# Patient Record
Sex: Female | Born: 1965 | Race: Black or African American | Hispanic: No | Marital: Single | Smoking: Never smoker
Health system: Southern US, Community
[De-identification: ages and names within clinical notes are randomized; demographics above are authoritative.]

## PROBLEM LIST (undated history)

## (undated) DIAGNOSIS — E039 Hypothyroidism, unspecified: Secondary | ICD-10-CM

## (undated) DIAGNOSIS — J302 Other seasonal allergic rhinitis: Secondary | ICD-10-CM

## (undated) DIAGNOSIS — Z87442 Personal history of urinary calculi: Secondary | ICD-10-CM

## (undated) DIAGNOSIS — J45909 Unspecified asthma, uncomplicated: Secondary | ICD-10-CM

## (undated) DIAGNOSIS — G43909 Migraine, unspecified, not intractable, without status migrainosus: Secondary | ICD-10-CM

## (undated) HISTORY — DX: Hypothyroidism, unspecified: E03.9

## (undated) HISTORY — PX: ABDOMINAL HYSTERECTOMY: SHX81

## (undated) HISTORY — PX: BREAST BIOPSY: SHX20

## (undated) HISTORY — PX: KNEE ARTHROSCOPY: SUR90

## (undated) HISTORY — PX: BREAST CYST EXCISION: SHX579

## (undated) HISTORY — DX: Other seasonal allergic rhinitis: J30.2

## (undated) HISTORY — DX: Migraine, unspecified, not intractable, without status migrainosus: G43.909

---
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
## 1997-01-31 HISTORY — PX: CHOLECYSTECTOMY: SHX55

## 2000-07-15 ENCOUNTER — Ambulatory Visit (HOSPITAL_COMMUNITY): Admission: RE | Admit: 2000-07-15 | Discharge: 2000-07-15 | Payer: Self-pay | Admitting: Internal Medicine

## 2000-07-15 ENCOUNTER — Encounter (INDEPENDENT_AMBULATORY_CARE_PROVIDER_SITE_OTHER): Payer: Self-pay | Admitting: Internal Medicine

## 2000-09-28 ENCOUNTER — Emergency Department (HOSPITAL_COMMUNITY): Admission: EM | Admit: 2000-09-28 | Discharge: 2000-09-28 | Payer: Self-pay | Admitting: Emergency Medicine

## 2000-10-26 ENCOUNTER — Emergency Department (HOSPITAL_COMMUNITY): Admission: EM | Admit: 2000-10-26 | Discharge: 2000-10-26 | Payer: Self-pay | Admitting: *Deleted

## 2000-12-09 ENCOUNTER — Emergency Department (HOSPITAL_COMMUNITY): Admission: EM | Admit: 2000-12-09 | Discharge: 2000-12-09 | Payer: Self-pay | Admitting: Internal Medicine

## 2000-12-09 ENCOUNTER — Encounter: Payer: Self-pay | Admitting: Internal Medicine

## 2001-01-31 HISTORY — PX: OVARIAN CYST SURGERY: SHX726

## 2001-02-12 ENCOUNTER — Inpatient Hospital Stay (HOSPITAL_COMMUNITY): Admission: AD | Admit: 2001-02-12 | Discharge: 2001-02-15 | Payer: Self-pay | Admitting: Obstetrics and Gynecology

## 2001-03-10 ENCOUNTER — Emergency Department (HOSPITAL_COMMUNITY): Admission: EM | Admit: 2001-03-10 | Discharge: 2001-03-10 | Payer: Self-pay | Admitting: Emergency Medicine

## 2001-05-23 ENCOUNTER — Emergency Department (HOSPITAL_COMMUNITY): Admission: EM | Admit: 2001-05-23 | Discharge: 2001-05-23 | Payer: Self-pay | Admitting: *Deleted

## 2001-06-19 ENCOUNTER — Ambulatory Visit (HOSPITAL_COMMUNITY): Admission: RE | Admit: 2001-06-19 | Discharge: 2001-06-19 | Payer: Self-pay | Admitting: Internal Medicine

## 2001-06-20 ENCOUNTER — Encounter: Payer: Self-pay | Admitting: Internal Medicine

## 2001-09-25 ENCOUNTER — Emergency Department (HOSPITAL_COMMUNITY): Admission: EM | Admit: 2001-09-25 | Discharge: 2001-09-25 | Payer: Self-pay | Admitting: *Deleted

## 2002-10-25 ENCOUNTER — Ambulatory Visit (HOSPITAL_COMMUNITY): Admission: RE | Admit: 2002-10-25 | Discharge: 2002-10-25 | Payer: Self-pay | Admitting: Internal Medicine

## 2002-10-25 ENCOUNTER — Encounter: Payer: Self-pay | Admitting: Internal Medicine

## 2002-11-05 ENCOUNTER — Ambulatory Visit (HOSPITAL_COMMUNITY): Admission: RE | Admit: 2002-11-05 | Discharge: 2002-11-05 | Payer: Self-pay

## 2002-12-20 ENCOUNTER — Emergency Department (HOSPITAL_COMMUNITY): Admission: EM | Admit: 2002-12-20 | Discharge: 2002-12-20 | Payer: Self-pay | Admitting: Emergency Medicine

## 2003-06-18 ENCOUNTER — Emergency Department (HOSPITAL_COMMUNITY): Admission: EM | Admit: 2003-06-18 | Discharge: 2003-06-18 | Payer: Self-pay | Admitting: Emergency Medicine

## 2003-09-11 ENCOUNTER — Ambulatory Visit (HOSPITAL_COMMUNITY): Admission: RE | Admit: 2003-09-11 | Discharge: 2003-09-11 | Payer: Self-pay | Admitting: Pulmonary Disease

## 2003-09-22 ENCOUNTER — Ambulatory Visit (HOSPITAL_COMMUNITY): Admission: RE | Admit: 2003-09-22 | Discharge: 2003-09-22 | Payer: Self-pay | Admitting: Obstetrics and Gynecology

## 2003-11-29 ENCOUNTER — Emergency Department (HOSPITAL_COMMUNITY): Admission: EM | Admit: 2003-11-29 | Discharge: 2003-11-29 | Payer: Self-pay | Admitting: Emergency Medicine

## 2004-02-01 HISTORY — PX: THYROIDECTOMY: SHX17

## 2004-02-19 ENCOUNTER — Emergency Department (HOSPITAL_COMMUNITY): Admission: EM | Admit: 2004-02-19 | Discharge: 2004-02-19 | Payer: Self-pay | Admitting: Emergency Medicine

## 2004-04-13 ENCOUNTER — Ambulatory Visit: Payer: Self-pay | Admitting: Family Medicine

## 2004-05-26 ENCOUNTER — Ambulatory Visit: Payer: Self-pay | Admitting: Family Medicine

## 2004-08-05 ENCOUNTER — Emergency Department (HOSPITAL_COMMUNITY): Admission: EM | Admit: 2004-08-05 | Discharge: 2004-08-05 | Payer: Self-pay | Admitting: Emergency Medicine

## 2004-08-06 ENCOUNTER — Ambulatory Visit: Payer: Self-pay | Admitting: Family Medicine

## 2004-09-03 ENCOUNTER — Ambulatory Visit: Payer: Self-pay | Admitting: Family Medicine

## 2004-09-23 ENCOUNTER — Ambulatory Visit (HOSPITAL_COMMUNITY): Admission: RE | Admit: 2004-09-23 | Discharge: 2004-09-23 | Payer: Self-pay | Admitting: Family Medicine

## 2004-10-08 ENCOUNTER — Encounter: Admission: RE | Admit: 2004-10-08 | Discharge: 2004-10-08 | Payer: Self-pay | Admitting: Family Medicine

## 2004-10-08 ENCOUNTER — Ambulatory Visit: Payer: Self-pay | Admitting: Family Medicine

## 2004-11-12 ENCOUNTER — Emergency Department (HOSPITAL_COMMUNITY): Admission: EM | Admit: 2004-11-12 | Discharge: 2004-11-12 | Payer: Self-pay | Admitting: Emergency Medicine

## 2004-11-15 ENCOUNTER — Ambulatory Visit: Payer: Self-pay | Admitting: Family Medicine

## 2005-01-31 HISTORY — PX: CYSTECTOMY: SUR359

## 2005-02-08 ENCOUNTER — Ambulatory Visit (HOSPITAL_BASED_OUTPATIENT_CLINIC_OR_DEPARTMENT_OTHER): Admission: RE | Admit: 2005-02-08 | Discharge: 2005-02-08 | Payer: Self-pay | Admitting: General Surgery

## 2005-02-08 ENCOUNTER — Encounter: Admission: RE | Admit: 2005-02-08 | Discharge: 2005-02-08 | Payer: Self-pay | Admitting: General Surgery

## 2005-02-08 ENCOUNTER — Encounter (INDEPENDENT_AMBULATORY_CARE_PROVIDER_SITE_OTHER): Payer: Self-pay | Admitting: *Deleted

## 2005-05-01 ENCOUNTER — Emergency Department (HOSPITAL_COMMUNITY): Admission: EM | Admit: 2005-05-01 | Discharge: 2005-05-01 | Payer: Self-pay | Admitting: Emergency Medicine

## 2005-07-06 ENCOUNTER — Ambulatory Visit: Payer: Self-pay | Admitting: Family Medicine

## 2005-10-04 ENCOUNTER — Encounter: Admission: RE | Admit: 2005-10-04 | Discharge: 2005-10-04 | Payer: Self-pay | Admitting: Family Medicine

## 2005-10-04 ENCOUNTER — Ambulatory Visit: Payer: Self-pay | Admitting: Family Medicine

## 2005-10-12 ENCOUNTER — Encounter: Admission: RE | Admit: 2005-10-12 | Discharge: 2005-10-12 | Payer: Self-pay | Admitting: Family Medicine

## 2005-12-21 ENCOUNTER — Ambulatory Visit: Payer: Self-pay | Admitting: Family Medicine

## 2006-01-06 ENCOUNTER — Ambulatory Visit: Payer: Self-pay | Admitting: Family Medicine

## 2006-03-09 ENCOUNTER — Ambulatory Visit (HOSPITAL_COMMUNITY): Admission: RE | Admit: 2006-03-09 | Discharge: 2006-03-09 | Payer: Self-pay | Admitting: Family Medicine

## 2006-03-09 ENCOUNTER — Ambulatory Visit: Payer: Self-pay | Admitting: Family Medicine

## 2006-04-05 ENCOUNTER — Ambulatory Visit: Payer: Self-pay | Admitting: Family Medicine

## 2006-05-29 ENCOUNTER — Encounter: Payer: Self-pay | Admitting: Family Medicine

## 2006-05-29 LAB — CONVERTED CEMR LAB
BUN: 11 mg/dL (ref 6–23)
Basophils Absolute: 0.1 10*3/uL (ref 0.0–0.1)
CO2: 23 meq/L (ref 19–32)
Calcium: 8.7 mg/dL (ref 8.4–10.5)
Creatinine, Ser: 0.71 mg/dL (ref 0.40–1.50)
Eosinophils Relative: 4 % (ref 0–5)
Hemoglobin: 12.9 g/dL — ABNORMAL LOW (ref 13.0–17.0)
Lymphocytes Relative: 40 % (ref 12–46)
Lymphs Abs: 1.8 10*3/uL (ref 0.7–3.3)
MCHC: 32.3 g/dL (ref 30.0–36.0)
MCV: 89.3 fL (ref 78.0–100.0)
Monocytes Absolute: 0.3 10*3/uL (ref 0.2–0.7)
Potassium: 3.9 meq/L (ref 3.5–5.3)
Sodium: 143 meq/L (ref 135–145)
TSH: 6.617 microintl units/mL — ABNORMAL HIGH (ref 0.350–5.50)
Triglycerides: 82 mg/dL (ref <150)
WBC: 4.5 10*3/uL (ref 4.0–10.5)

## 2006-06-01 ENCOUNTER — Ambulatory Visit: Payer: Self-pay | Admitting: Family Medicine

## 2006-07-11 ENCOUNTER — Ambulatory Visit: Payer: Self-pay | Admitting: Internal Medicine

## 2006-07-11 ENCOUNTER — Ambulatory Visit (HOSPITAL_COMMUNITY): Admission: RE | Admit: 2006-07-11 | Discharge: 2006-07-11 | Payer: Self-pay | Admitting: Internal Medicine

## 2006-07-20 ENCOUNTER — Encounter: Payer: Self-pay | Admitting: Family Medicine

## 2006-07-27 ENCOUNTER — Ambulatory Visit: Payer: Self-pay | Admitting: Family Medicine

## 2006-09-11 ENCOUNTER — Encounter: Payer: Self-pay | Admitting: Family Medicine

## 2006-09-11 LAB — CONVERTED CEMR LAB: TSH: 2.309 microintl units/mL (ref 0.350–5.50)

## 2006-09-13 ENCOUNTER — Emergency Department (HOSPITAL_COMMUNITY): Admission: EM | Admit: 2006-09-13 | Discharge: 2006-09-13 | Payer: Self-pay | Admitting: Emergency Medicine

## 2006-09-22 ENCOUNTER — Ambulatory Visit: Payer: Self-pay | Admitting: Family Medicine

## 2006-10-13 ENCOUNTER — Encounter: Admission: RE | Admit: 2006-10-13 | Discharge: 2006-10-13 | Payer: Self-pay | Admitting: Family Medicine

## 2007-01-18 ENCOUNTER — Encounter: Payer: Self-pay | Admitting: Family Medicine

## 2007-01-18 LAB — CONVERTED CEMR LAB: T3, Free: 2.9 pg/mL (ref 2.3–4.2)

## 2007-01-29 ENCOUNTER — Ambulatory Visit: Payer: Self-pay | Admitting: Family Medicine

## 2007-05-28 ENCOUNTER — Encounter: Payer: Self-pay | Admitting: Family Medicine

## 2007-05-28 LAB — CONVERTED CEMR LAB
BUN: 8 mg/dL (ref 6–23)
Basophils Absolute: 0.1 10*3/uL (ref 0.0–0.1)
Basophils Relative: 2 % — ABNORMAL HIGH (ref 0–1)
Calcium: 8.8 mg/dL (ref 8.4–10.5)
Chloride: 110 meq/L (ref 96–112)
Creatinine, Ser: 0.7 mg/dL (ref 0.40–1.50)
Eosinophils Absolute: 0.2 10*3/uL (ref 0.0–0.7)
HCT: 38.9 % — ABNORMAL LOW (ref 39.0–52.0)
HDL: 56 mg/dL (ref >39)
MCV: 88.6 fL (ref 78.0–100.0)
Monocytes Absolute: 0.3 10*3/uL (ref 0.1–1.0)
Neutro Abs: 2.2 10*3/uL (ref 1.7–7.7)
RDW: 13.4 % (ref 11.5–15.5)
Sodium: 141 meq/L (ref 135–145)
Total CHOL/HDL Ratio: 2.8
WBC: 4.8 10*3/uL (ref 4.0–10.5)

## 2007-05-31 ENCOUNTER — Ambulatory Visit: Payer: Self-pay | Admitting: Family Medicine

## 2007-06-06 DIAGNOSIS — J301 Allergic rhinitis due to pollen: Secondary | ICD-10-CM | POA: Insufficient documentation

## 2007-06-06 DIAGNOSIS — G43109 Migraine with aura, not intractable, without status migrainosus: Secondary | ICD-10-CM | POA: Insufficient documentation

## 2007-06-06 DIAGNOSIS — E039 Hypothyroidism, unspecified: Secondary | ICD-10-CM | POA: Insufficient documentation

## 2007-09-03 ENCOUNTER — Telehealth: Payer: Self-pay | Admitting: Family Medicine

## 2007-09-04 ENCOUNTER — Ambulatory Visit: Payer: Self-pay | Admitting: Family Medicine

## 2007-09-04 ENCOUNTER — Encounter: Payer: Self-pay | Admitting: Family Medicine

## 2007-09-04 DIAGNOSIS — R259 Unspecified abnormal involuntary movements: Secondary | ICD-10-CM | POA: Insufficient documentation

## 2007-09-19 ENCOUNTER — Telehealth: Payer: Self-pay | Admitting: Family Medicine

## 2007-09-19 ENCOUNTER — Ambulatory Visit: Payer: Self-pay | Admitting: Family Medicine

## 2007-10-17 ENCOUNTER — Encounter: Admission: RE | Admit: 2007-10-17 | Discharge: 2007-10-17 | Payer: Self-pay | Admitting: Family Medicine

## 2007-11-05 ENCOUNTER — Encounter: Payer: Self-pay | Admitting: Family Medicine

## 2007-11-05 ENCOUNTER — Other Ambulatory Visit: Admission: RE | Admit: 2007-11-05 | Discharge: 2007-11-05 | Payer: Self-pay | Admitting: Obstetrics and Gynecology

## 2007-11-05 LAB — CONVERTED CEMR LAB: TSH: 3.343 microintl units/mL (ref 0.350–4.50)

## 2007-11-13 ENCOUNTER — Ambulatory Visit: Payer: Self-pay | Admitting: Family Medicine

## 2008-01-09 ENCOUNTER — Ambulatory Visit: Payer: Self-pay | Admitting: Family Medicine

## 2008-01-09 DIAGNOSIS — J0100 Acute maxillary sinusitis, unspecified: Secondary | ICD-10-CM | POA: Insufficient documentation

## 2008-01-09 DIAGNOSIS — J209 Acute bronchitis, unspecified: Secondary | ICD-10-CM | POA: Insufficient documentation

## 2008-01-09 DIAGNOSIS — J029 Acute pharyngitis, unspecified: Secondary | ICD-10-CM | POA: Insufficient documentation

## 2008-01-09 LAB — CONVERTED CEMR LAB: Rapid Strep: NEGATIVE

## 2008-01-13 DIAGNOSIS — R519 Headache, unspecified: Secondary | ICD-10-CM | POA: Insufficient documentation

## 2008-01-13 DIAGNOSIS — R51 Headache: Secondary | ICD-10-CM | POA: Insufficient documentation

## 2008-02-07 ENCOUNTER — Encounter: Payer: Self-pay | Admitting: Family Medicine

## 2008-02-11 ENCOUNTER — Telehealth: Payer: Self-pay | Admitting: Family Medicine

## 2008-02-11 ENCOUNTER — Encounter: Payer: Self-pay | Admitting: Family Medicine

## 2008-02-11 LAB — CONVERTED CEMR LAB: TSH: 3.625 microintl units/mL (ref 0.350–4.50)

## 2008-02-13 ENCOUNTER — Encounter: Payer: Self-pay | Admitting: Family Medicine

## 2008-02-23 ENCOUNTER — Encounter: Payer: Self-pay | Admitting: Family Medicine

## 2008-05-08 ENCOUNTER — Encounter: Payer: Self-pay | Admitting: Family Medicine

## 2008-05-22 ENCOUNTER — Encounter: Payer: Self-pay | Admitting: Family Medicine

## 2008-05-22 LAB — CONVERTED CEMR LAB
Basophils Absolute: 0.1 10*3/uL (ref 0.0–0.1)
Creatinine, Ser: 0.78 mg/dL (ref 0.40–1.20)
Eosinophils Absolute: 0.2 10*3/uL (ref 0.0–0.7)
Hemoglobin: 13.4 g/dL (ref 12.0–15.0)
LDL Cholesterol: 95 mg/dL (ref 0–99)
Lymphocytes Relative: 39 % (ref 12–46)
Monocytes Relative: 9 % (ref 3–12)
Potassium: 4.2 meq/L (ref 3.5–5.3)
RBC: 4.63 M/uL (ref 3.87–5.11)
RDW: 13.4 % (ref 11.5–15.5)
Sodium: 140 meq/L (ref 135–145)
Total CHOL/HDL Ratio: 2.9
VLDL: 16 mg/dL (ref 0–40)

## 2008-05-29 ENCOUNTER — Ambulatory Visit: Payer: Self-pay | Admitting: Family Medicine

## 2008-05-29 DIAGNOSIS — R5383 Other fatigue: Secondary | ICD-10-CM

## 2008-05-29 DIAGNOSIS — R5381 Other malaise: Secondary | ICD-10-CM | POA: Insufficient documentation

## 2008-06-05 ENCOUNTER — Telehealth: Payer: Self-pay | Admitting: Family Medicine

## 2008-06-05 ENCOUNTER — Encounter: Payer: Self-pay | Admitting: Family Medicine

## 2008-06-23 ENCOUNTER — Telehealth: Payer: Self-pay | Admitting: Family Medicine

## 2008-07-13 ENCOUNTER — Emergency Department (HOSPITAL_COMMUNITY): Admission: EM | Admit: 2008-07-13 | Discharge: 2008-07-13 | Payer: Self-pay | Admitting: Emergency Medicine

## 2008-10-09 ENCOUNTER — Encounter: Payer: Self-pay | Admitting: Family Medicine

## 2008-10-09 ENCOUNTER — Telehealth: Payer: Self-pay | Admitting: Family Medicine

## 2008-10-17 ENCOUNTER — Encounter: Admission: RE | Admit: 2008-10-17 | Discharge: 2008-10-17 | Payer: Self-pay | Admitting: Family Medicine

## 2008-10-28 ENCOUNTER — Ambulatory Visit: Payer: Self-pay | Admitting: Family Medicine

## 2008-10-28 DIAGNOSIS — M79609 Pain in unspecified limb: Secondary | ICD-10-CM | POA: Insufficient documentation

## 2008-10-28 DIAGNOSIS — J320 Chronic maxillary sinusitis: Secondary | ICD-10-CM | POA: Insufficient documentation

## 2008-10-30 ENCOUNTER — Encounter: Payer: Self-pay | Admitting: Family Medicine

## 2008-11-12 ENCOUNTER — Encounter: Payer: Self-pay | Admitting: Family Medicine

## 2008-11-23 ENCOUNTER — Telehealth (INDEPENDENT_AMBULATORY_CARE_PROVIDER_SITE_OTHER): Payer: Self-pay | Admitting: *Deleted

## 2008-12-01 ENCOUNTER — Encounter: Payer: Self-pay | Admitting: Family Medicine

## 2008-12-10 ENCOUNTER — Ambulatory Visit (HOSPITAL_COMMUNITY): Admission: RE | Admit: 2008-12-10 | Discharge: 2008-12-10 | Payer: Self-pay | Admitting: Family Medicine

## 2008-12-11 ENCOUNTER — Encounter: Payer: Self-pay | Admitting: Family Medicine

## 2009-02-26 ENCOUNTER — Ambulatory Visit: Payer: Self-pay | Admitting: Family Medicine

## 2009-02-26 DIAGNOSIS — R6889 Other general symptoms and signs: Secondary | ICD-10-CM | POA: Insufficient documentation

## 2009-03-03 ENCOUNTER — Encounter: Payer: Self-pay | Admitting: Family Medicine

## 2009-03-03 LAB — CONVERTED CEMR LAB: TSH: 4.466 microintl units/mL (ref 0.350–4.500)

## 2009-03-25 ENCOUNTER — Encounter: Payer: Self-pay | Admitting: Family Medicine

## 2009-05-31 ENCOUNTER — Emergency Department (HOSPITAL_COMMUNITY): Admission: EM | Admit: 2009-05-31 | Discharge: 2009-05-31 | Payer: Self-pay

## 2009-09-21 LAB — CONVERTED CEMR LAB
BUN: 10 mg/dL (ref 6–23)
Basophils Relative: 3 % — ABNORMAL HIGH (ref 0–1)
Chloride: 109 meq/L (ref 96–112)
Cholesterol: 163 mg/dL (ref 0–200)
Creatinine, Ser: 0.73 mg/dL (ref 0.40–1.20)
Eosinophils Relative: 4 % (ref 0–5)
HCT: 39.5 % (ref 36.0–46.0)
Hemoglobin: 12.9 g/dL (ref 12.0–15.0)
Lymphocytes Relative: 39 % (ref 12–46)
MCHC: 32.7 g/dL (ref 30.0–36.0)
MCV: 89.6 fL (ref 78.0–100.0)
Monocytes Relative: 8 % (ref 3–12)
Neutrophils Relative %: 46 % (ref 43–77)
Platelets: 289 10*3/uL (ref 150–400)
Potassium: 4 meq/L (ref 3.5–5.3)
RDW: 13.5 % (ref 11.5–15.5)
WBC: 5.1 10*3/uL (ref 4.0–10.5)

## 2009-09-24 ENCOUNTER — Ambulatory Visit: Payer: Self-pay | Admitting: Family Medicine

## 2009-09-24 LAB — CONVERTED CEMR LAB: TSH: 3.677 microintl units/mL (ref 0.350–4.500)

## 2009-09-25 ENCOUNTER — Encounter: Payer: Self-pay | Admitting: Family Medicine

## 2009-11-30 ENCOUNTER — Telehealth: Payer: Self-pay | Admitting: Family Medicine

## 2010-02-15 ENCOUNTER — Telehealth: Payer: Self-pay | Admitting: Family Medicine

## 2010-02-15 ENCOUNTER — Ambulatory Visit
Admission: RE | Admit: 2010-02-15 | Discharge: 2010-02-15 | Payer: Self-pay | Source: Home / Self Care | Attending: Family Medicine | Admitting: Family Medicine

## 2010-02-16 ENCOUNTER — Telehealth: Payer: Self-pay | Admitting: Family Medicine

## 2010-02-21 ENCOUNTER — Encounter: Payer: Self-pay | Admitting: Family Medicine

## 2010-02-26 ENCOUNTER — Encounter
Admission: RE | Admit: 2010-02-26 | Discharge: 2010-02-26 | Payer: Self-pay | Source: Home / Self Care | Attending: Family Medicine | Admitting: Family Medicine

## 2010-03-04 NOTE — Assessment & Plan Note (Signed)
Summary: office visit   Vital Signs:  Patient profile:   45 year old female Menstrual status:  hysterectomy Height:      65 inches Weight:      184 pounds BMI:     30.73 O2 Sat:      98 % Pulse rate:   70 / minute Pulse rhythm:   regular Resp:     16 per minute BP sitting:   118 / 74 Cuff size:   regular  Vitals Entered By: Gail Cox (February 26, 2009 1:08 PM)  Nutrition Counseling: Patient's BMI is greater than 25 and therefore counseled on weight management options. CC: has been having  sour taste in mouth, was given antibiotics and was told to come back if it didn't go away, She also had her sinuses checked and it wasn't due to that   CC:  has been having  sour taste in mouth, was given antibiotics and was told to come back if it didn't go away, and She also had her sinuses checked and it wasn't due to that.  History of Present Illness: Over 2 year h/o bad breath and a bad taste in her mouth, continues to suck on candy to keep this down, her dental care is excellent. She has had sinus eval and a therapeutic trial of antibiotics no help. She reports improvement oin her migraines, in terms of frequency and severity. Reports  that she has otherwise been doing well. Denies recent fever or chills. Denies sinus pressure, nasal congestion , ear pain or sore throat. Denies chest congestion, or cough productive of sputum. Denies chest pain, palpitations, PND, orthopnea or leg swelling. Denies abdominal pain, nausea, vomitting, diarrhea or constipation. Denies change in bowel movements or bloody stool. Denies dysuria , frequency, incontinence or hesitancy. Denies  joint pain, swelling, or reduced mobility. Denies headaches, vertigo, seizures. Denies depression, anxiety or insomnia. Denies  rash, lesions, or itch. [name] has not been exercising, she has gained weight , and she intends to change her diet to facilitate weight loss.     Current Medications (verified): 1)   Imitrex 100 Mg  Tabs (Sumatriptan Succinate) .... Take One Tablet At Onset of Headache Repeat After Two Hours As Needed 2)  Astepro 137 Mcg/spray  Soln (Azelastine Hcl) .... Inhale Two Puffs Twice Daily 3)  Proair Hfa 108 (90 Base) Mcg/act  Aers (Albuterol Sulfate) .... 2 Puffs Every 6 To 8 Hrs. As Needed 4)  Synthroid 125 Mcg  Tabs (Levothyroxine Sodium) .... Take 1 Tablet By Mouth Once A Day  Allergies (verified): 1)  ! Vicodin  Review of Systems      See HPI Eyes:  Denies blurring and discharge. MS:  left heel spur. Endo:  Denies cold intolerance, excessive hunger, excessive thirst, excessive urination, heat intolerance, polyuria, and weight change. Heme:  Denies abnormal bruising and bleeding. Allergy:  Complains of seasonal allergies; denies hives or rash and sneezing.  Physical Exam  General:  Well-developed,well-nourished,in no acute distress; alert,appropriate and cooperative throughout examination HEENT: No facial asymmetry,  EOMI, No sinus tenderness, TM's Clear, oropharynx  pink and moist.   Chest: Clear to auscultation bilaterally.  CVS: S1, S2, No murmurs, No S3.   Abd: Soft, Nontender.  MS: Adequate ROM spine, hips, shoulders and knees.  Ext: No edema.   CNS: CN 2-12 intact, power tone and sensation normal throughout.   Skin: Intact, no visible lesions or rashes.  Psych: Good eye contact, normal affect.  Memory intact, not anxious or  depressed appearing.    Impression & Recommendations:  Problem # 1:  FOOT PAIN, LEFT (ICD-729.5) Assessment Unchanged pt has a spur  Problem # 2:  HYPOTHYROIDISM (ICD-244.9) Assessment: Comment Only  Her updated medication list for this problem includes:    Synthroid 125 Mcg Tabs (Levothyroxine sodium) .[name]... Take 1 tablet by mouth once a day  Labs Reviewed: TSH: 1.661 (10/28/2008)    Chol: 168 (05/22/2008)   HDL: 57 (05/22/2008)   LDL: 95 (05/22/2008)   TG: 79 (05/22/2008)  Problem # 3:  ALLERGIC RHINITIS, SEASONAL  (ICD-477.0) Assessment: Unchanged  Problem # 4:  OTHER SYMPTOMS INVOLVING HEAD AND NECK (ICD-784.99) Assessment: Comment Only  Orders: ENT Referral (ENT), chronic c/o halitosis  Complete Medication List: 1)  Imitrex 100 Mg Tabs (Sumatriptan succinate) .... Take one tablet at onset of headache repeat after two hours as needed 2)  Astepro 137 Mcg/spray Soln (Azelastine hcl) .... Inhale two puffs twice daily 3)  Proair Hfa 108 (90 Base) Mcg/act Aers (Albuterol sulfate) .... 2 puffs every 6 to 8 hrs. as needed 4)  Synthroid 125 Mcg Tabs (Levothyroxine sodium) .... Take 1 tablet by mouth once a day  Other Orders: T-Basic Metabolic Panel [phone]) T-Lipid Profile [phone]) T-CBC w/Diff [id number]) T-TSH [id number])  Patient Instructions: 1)  Please schedule a follow-up appointment in 4.5 months. 2)  It is important that you exercise regularly at least 20 minutes 5 times a week. If you develop chest pain, have severe difficulty breathing, or feel very tired , stop exercising immediately and seek medical attention. 3)  You need to lose weight. Consider a lower calorie diet and regular exercise.  4)  you will be referred  to eNT for eval of your halitosis 5)  TSH prior to visit, ICD-9:  today. 6)  BMP prior to visit, ICD-9: 7)  Lipid Panel prior to visit, ICD-9:  fasting in 4.5 months 8)  TSH prior to visit, ICD-9: 9)  CBC w/ Diff prior to visit, ICD-9:

## 2010-03-04 NOTE — Letter (Signed)
Summary: Letter  Letter   Imported By: Lind Guest 03/03/2009 15:42:56  _____________________________________________________________________  External Attachment:    Type:   Image     Comment:   External Document

## 2010-03-04 NOTE — Assessment & Plan Note (Signed)
Summary: per dr   Vital Signs:  Patient profile:   45 year old female Menstrual status:  hysterectomy Height:      65 inches Weight:      179.75 pounds BMI:     30.02 O2 Sat:      98 % Pulse rate:   87 / minute Pulse rhythm:   regular Resp:     16 per minute BP sitting:   130 / 80  (left arm) Cuff size:   regular  Vitals Entered By: Everitt Amber LPN (February 15, 2010 2:03 PM)  Nutrition Counseling: Patient's BMI is greater than 25 and therefore counseled on weight management options. CC: has had a bad cold since wednesday, nasal congestion, pressure and sneezing. Greenish phlegm, sore throat   CC:  has had a bad cold since wednesday, nasal congestion, pressure and sneezing. Greenish phlegm, and sore throat.  History of Present Illness: 6 day h/o maxillary pressure, nasal congestion, sore throat, tender swollen neck glands  Denies chest congestion, or cough productive of sputum. Denies chest pain, palpitations, PND, orthopnea or leg swelling. Denies abdominal pain, nausea, vomitting, diarrhea or constipation. Denies change in bowel movements or bloody stool. Denies dysuria , frequency, incontinence or hesitancy. Denies  joint pain, swelling, or reduced mobility. Denies  vertigo or  seizures.Her headaches have improved Denies depression, anxiety or insomnia. Denies  rash, lesions, or itch.     Current Medications (verified): 1)  Imitrex 100 Mg  Tabs (Sumatriptan Succinate) .... Take One Tablet At Onset of Headache Repeat After Two Hours As Needed 2)  Proair Hfa 108 (90 Base) Mcg/act  Aers (Albuterol Sulfate) .... 2 Puffs Every 6 To 8 Hrs. As Needed 3)  Synthroid 125 Mcg  Tabs (Levothyroxine Sodium) .... Take 1 Tablet By Mouth Once A Day  Allergies (verified): 1)  ! Vicodin  Review of Systems      See HPI General:  Complains of chills, fatigue, and malaise. ENT:  Complains of nasal congestion, sinus pressure, and sore throat. Neuro:  Complains of headaches. Allergy:   Complains of seasonal allergies and sneezing.  Physical Exam  General:  Well-developed,well-nourished,in no acute distress; alert,appropriate and cooperative throughout examinationIll appearing. HEENT: No facial asymmetry,  EOMI, maxillary  sinus tenderness, TM's Clear, oropharynx mildly eruthematous, bilateral cervicaldenitis  Chest: Clear to auscultation bilaterally.  CVS: S1, S2, No murmurs, No S3.   Abd: Soft, Nontender.  MS: Adequate ROM spine, hips, shoulders and knees.  Ext: No edema.   CNS: CN 2-12 intact, power tone and sensation normal throughout.   Skin: Intact, no visible lesions or rashes.  Psych: Good eye contact, normal affect.  Memory intact, not anxious or depressed appearing.    Impression & Recommendations:  Problem # 1:  ACUTE MAXILLARY SINUSITIS (ICD-461.0) Assessment Comment Only  Her updated medication list for this problem includes:    Penicillin V Potassium 500 Mg Tabs (Penicillin v potassium) .[name]... Take 1 tablet by mouth three times a day    Tessalon Perles 100 Mg Caps (Benzonatate) .[name]... Take 1 capsule by mouth three times a day  Orders: Depo- Medrol 80mg  (J1040) Ketorolac-Toradol 15mg  (W0981)  Problem # 2:  HEADACHE (ICD-784.0) Assessment: Improved  Her updated medication list for this problem includes:    Imitrex 100 Mg Tabs (Sumatriptan succinate) .[name]... Take one tablet at onset of headache repeat after two hours as needed  Problem # 3:  HYPOTHYROIDISM (ICD-244.9) Assessment: Comment Only  Her updated medication list for this problem includes:  Synthroid 125 Mcg Tabs (Levothyroxine sodium) .[name]... Take 1 tablet by mouth once a day  Orders: T-TSH ([id number]) T-TSH [phone])  Labs Reviewed: TSH: 3.677 (09/24/2009)    Chol: 163 (09/21/2009)   HDL: 55 (09/21/2009)   LDL: 94 (09/21/2009)   TG: 72 (09/21/2009)  Complete Medication List: 1)  Imitrex 100 Mg Tabs (Sumatriptan succinate) .... Take one tablet at onset of headache repeat  after two hours as needed 2)  Proair Hfa 108 (90 Base) Mcg/act Aers (Albuterol sulfate) .... 2 puffs every 6 to 8 hrs. as needed 3)  Synthroid 125 Mcg Tabs (Levothyroxine sodium) .... Take 1 tablet by mouth once a day 4)  Penicillin V Potassium 500 Mg Tabs (Penicillin v potassium) .... Take 1 tablet by mouth three times a day 5)  Tessalon Perles 100 Mg Caps (Benzonatate) .... Take 1 capsule by mouth three times a day  Other Orders: Admin of Therapeutic Inj  intramuscular or subcutaneous (13086) Rocephin  250mg  (V7846)  Patient Instructions: 1)  Please schedule a follow-up appointment in 4.5 months.Cancel any earlier 2)  You are being treated for sinusitis . 3)  You will get injections in the office and meds are also sent in  4)  TSH prior to visit, ICD-9:  today 5)  TSH in 4.5 months 6)  Schedule your mammogram.PAST DUE pLS sCHED 7)  You need to have a Pap Smear to prevent cervical cancer. Prescriptions: PREDNISONE (PAK) 5 MG TABS (PREDNISONE) Use as directed  #21 x 0   Entered and Authorized by:   Syliva Overman MD   Signed by:   Syliva Overman MD on 02/15/2010   Method used:   Electronically to        [facility] [address] [facility] (retail)       [address]       [place], [place]  [address]       Ph: [phone]       Fax: [phone]   RxID:   [id number] TESSALON PERLES 100 MG CAPS (BENZONATATE) Take 1 capsule by mouth three times a day  #30 x 0   Entered and Authorized by:   Syliva Overman MD   Signed by:   Syliva Overman MD on 02/15/2010   Method used:   Electronically to        [facility] [address] [facility] (retail)       [address]       [place], [place]  [address]       Ph: [phone]       Fax: [phone]   RxID:   [id number] PENICILLIN V POTASSIUM 500 MG TABS (PENICILLIN V POTASSIUM) Take 1 tablet by mouth three times a day  #42 x 0   Entered and Authorized by:   Syliva Overman MD   Signed by:   Syliva Overman MD on  02/15/2010   Method used:   Electronically to        [facility] [address] [facility] (retail)       [address]       [place], [place]  [address]       Ph: [phone]       Fax: [phone]   RxID:   [id number]    Medication Administration  Injection # 1:    Medication: Depo- Medrol 80mg     Diagnosis: ACUTE  MAXILLARY SINUSITIS (ICD-461.0)    Route: IM    Site: RUOQ gluteus    Exp Date: 07/12    Lot #: [id number]    Mfr: Pharmacia    Patient tolerated injection without complications    Given by: Adella Hare LPN (February 15, 2010 3:03 PM)  Injection # 2:    Medication: Ketorolac-Toradol 15mg     Diagnosis: ACUTE MAXILLARY SINUSITIS (ICD-461.0)    Route: IM    Site: LUOQ gluteus    Exp Date: 07/02/2011    Lot #: [id number]    Mfr: [email or web address]    Comments: toradol 60mg  given    Patient tolerated injection without complications    Given by: Adella Hare LPN (February 15, 2010 3:04 PM)  Injection # 3:    Medication: Rocephin  250mg     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 Patient Level IV [11914] 2)  T-TSH [78295-62130] 3)  T-TSH [86578-46962] 4)  Depo- Medrol 80mg  [J1040] 5)  Ketorolac-Toradol 15mg  [J1885] 6)  Admin of Therapeutic Inj  intramuscular or subcutaneous [96372] 7)  Rocephin  250mg  [J0696]     Medication Administration  Injection # 1:    Medication: Depo- Medrol 80mg     Diagnosis: ACUTE MAXILLARY SINUSITIS (ICD-461.0)    Route: IM    Site: RUOQ gluteus    Exp Date: 07/12    Lot #: Gunnar [id number]    Mfr: Pharmacia    Patient tolerated injection without complications    Given by: Adella Hare LPN (February 15, 2010 3:03 PM)  Injection # 2:    Medication: Ketorolac-Toradol 15mg      Diagnosis: ACUTE MAXILLARY SINUSITIS (ICD-461.0)    Route: IM    Site: LUOQ gluteus    Exp Date: 07/02/2011    Lot #: [id number]    Mfr: [email or web address]    Comments: toradol 60mg  given    Patient tolerated injection without complications    Given by: Adella Hare LPN (February 15, 2010 3:04 PM)  Injection # 3:    Medication: Rocephin  250mg     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 Patient Level IV [27253] 2)  T-TSH [66440-34742] 3)  T-TSH [59563-87564] 4)  Depo- Medrol 80mg  [J1040] 5)  Ketorolac-Toradol 15mg  [J1885] 6)  Admin of Therapeutic Inj  intramuscular or subcutaneous [96372] 7)  Rocephin  250mg  [P3295]

## 2010-03-04 NOTE — Assessment & Plan Note (Signed)
Summary: ov   Vital Signs:  Patient profile:   45 year old female Menstrual status:  hysterectomy Height:      65 inches Weight:      181 pounds BMI:     30.23 O2 Sat:      98 % on Room air Pulse rate:   85 / minute Pulse rhythm:   regular Resp:     16 per minute BP sitting:   120 / 76  (left arm)  Vitals Entered By: Adella Hare LPN (September 24, 2009 10:26 AM)  Nutrition Counseling: Patient's BMI is greater than 25 and therefore counseled on weight management options.  O2 Flow:  Room air CC: follow-up visit Is Patient Diabetic? No Pain Assessment Patient in pain? no        CC:  follow-up visit.  History of Present Illness: Reports  that she has been  doing well. Denies recent fever or chills. Denies sinus pressure, nasal congestion , ear pain or sore throat. Denies chest congestion, or cough productive of sputum. Denies chest pain, palpitations, PND, orthopnea or leg swelling. Denies abdominal pain, nausea, vomitting, diarrhea or constipation. Denies change in bowel movements or bloody stool. Denies dysuria , frequency, incontinence or hesitancy. Denies  joint pain, swelling, or reduced mobility. reports improvement in  headaches,denies  vertigo or  seizures. Denies depression, anxiety or insomnia. Denies  rash, lesions, or itch.     Current Medications (verified): 1)  Imitrex 100 Mg  Tabs (Sumatriptan Succinate) .... Take One Tablet At Onset of Headache Repeat After Two Hours As Needed 2)  Proair Hfa 108 (90 Base) Mcg/act  Aers (Albuterol Sulfate) .... 2 Puffs Every 6 To 8 Hrs. As Needed 3)  Synthroid 125 Mcg  Tabs (Levothyroxine Sodium) .... Take 1 Tablet By Mouth Once A Day  Allergies (verified): 1)  ! Vicodin  Review of Systems      See HPI General:  Complains of fatigue. Eyes:  Denies blurring and discharge. MS:  left heel pain x 3 yrs progressing. Derm:  Complains of lesion(s); p[ain;ess swelling on 2nd and 4th fimngers of the right hand, wants to  observe at this time. Neuro:  Complains of headaches; migraine headaches fairly stable and well controlled, most recent was 4 days ago, finally left 2 days  later, exceddrin migraine worked, but needs immitrex. Endo:  Denies cold intolerance, excessive thirst, excessive urination, and weight change. Heme:  Denies abnormal bruising and bleeding. Allergy:  Complains of seasonal allergies.  Physical Exam  General:  Well-developed,well-nourished,in no acute distress; alert,appropriate and cooperative throughout examination HEENT: No facial asymmetry,  EOMI, No sinus tenderness, TM's Clear, oropharynx  pink and moist.   Chest: Clear to auscultation bilaterally.  CVS: S1, S2, No murmurs, No S3.   Abd: Soft, Nontender.  MS: Adequate ROM spine, hips, shoulders and knees.  Ext: No edema.   CNS: CN 2-12 intact, power tone and sensation normal throughout.   Skin: Intact, no visible lesions or rashes.  Psych: Good eye contact, normal affect.  Memory intact, not anxious or depressed appearing.    Impression & Recommendations:  Problem # 1:  FOOT PAIN, LEFT (ICD-729.5) Assessment Unchanged  Problem # 2:  HEADACHE (ICD-784.0) Assessment: Improved  Her updated medication list for this problem includes:    Imitrex 100 Mg Tabs (Sumatriptan succinate) .[name]... Take one tablet at onset of headache repeat after two hours as needed  Problem # 3:  HYPOTHYROIDISM (ICD-244.9) Assessment: Comment Only  Her updated medication list for  this problem includes:    Synthroid 125 Mcg Tabs (Levothyroxine sodium) .[name]... Take 1 tablet by mouth once a day  Orders: T-TSH ([id number])  Labs Reviewed: TSH: 4.466 (02/26/2009)    Chol: 163 (09/21/2009)   HDL: 55 (09/21/2009)   LDL: 94 (09/21/2009)   TG: 72 (09/21/2009)  Complete Medication List: 1)  Imitrex 100 Mg Tabs (Sumatriptan succinate) .... Take one tablet at onset of headache repeat after two hours as needed 2)  Proair Hfa 108 (90 Base) Mcg/act Aers  (Albuterol sulfate) .... 2 puffs every 6 to 8 hrs. as needed 3)  Synthroid 125 Mcg Tabs (Levothyroxine sodium) .... Take 1 tablet by mouth once a day  Patient Instructions: 1)  Please schedule a follow-up appointment in 4.59months. 2)  TSH prior to visit, ICD-9:  in 4.5 months 3)  It is important that you exercise regularly at least 30 minutes 5 times a week. If you develop chest pain, have severe difficulty breathing, or feel very tired , stop exercising immediately and seek medical attention. 4)  You need to lose weight. Consider a lower calorie diet and regular exercise. Goal is 6 pounds 5)  meds will be refilled, we will call you at [phone] tomorrow about the thyroid result 6)  Schedule your mammogram. 7)  You need to have a Pap Smear to prevent cervical cancer. Prescriptions: SYNTHROID 125 MCG  TABS (LEVOTHYROXINE SODIUM) Take 1 tablet by mouth once a day Brand medically necessary #30 Each x 4   Entered by:   Adella Hare LPN   Authorized by:   Syliva Overman MD   Signed by:   Adella Hare LPN on 65/78/4696   Method used:   Electronically to        [facility] [address] (retail)       [address]       [place], [place]  [address]       Ph: [phone]       Fax: [phone]   RxID:   [id number] PROAIR HFA 108 (90 BASE) MCG/ACT  AERS (ALBUTEROL SULFATE) 2 puffs every 6 to 8 hrs. as needed  #9 Gram x 4   Entered by:   Adella Hare LPN   Authorized by:   Syliva Overman MD   Signed by:   Adella Hare LPN on 29/51/8841   Method used:   Electronically to        [facility]  [address] [address] (retail)       [address]       [place], [place]  [address]       Ph: [phone]       Fax: [phone]   RxID:   [id number] IMITREX 100 MG  TABS (SUMATRIPTAN SUCCINATE) take one tablet at onset of headache repeat after two hours as needed  #10 Each x 4   Entered by:   Adella Hare LPN   Authorized by:   Syliva Overman MD   Signed by:   Adella Hare LPN on 16/07/3708   Method used:   Electronically to        [facility]  [address] [address] (retail)       [address]       [place], [place]  [address]       Ph: [phone]       Fax: [phone]  RxID:   [id number]

## 2010-03-04 NOTE — Progress Notes (Signed)
Summary: FLU SHOT  Phone Note Call from Patient   Summary of Call: HER WORK PLACE GAVE OUT FLU SHOTS WANTED YOU TO KNOW Initial call taken by: Lind Guest,  November 30, 2009 4:19 PM  Follow-up for Phone Call        noted  Follow-up by: Syliva Overman MD,  November 30, 2009 6:09 PM

## 2010-03-04 NOTE — Progress Notes (Signed)
Summary: FYI  Phone Note Call from Patient   Summary of Call: Just wanted Dr. Lodema Hong to know she scheduled mammo for 02/26/2010.  FYI Initial call taken by: Rudene Anda,  February 16, 2010 11:06 AM  Follow-up for Phone Call        noted Follow-up by: Adella Hare LPN,  February 16, 2010 11:12 AM

## 2010-03-04 NOTE — Progress Notes (Signed)
Summary: [facility] TE[facility]  [facility]   Imported By: Lind Guest 04/07/2009 08:16:40  _____________________________________________________________________  External Attachment:    Type:   Image     Comment:   External Document

## 2010-03-04 NOTE — Letter (Signed)
Summary: LAB ON  LAB ON   Imported By: Lind Guest 09/25/2009 07:59:23  _____________________________________________________________________  External Attachment:    Type:   Image     Comment:   External Document

## 2010-03-04 NOTE — Progress Notes (Signed)
Summary: sick  Phone Note Call from Patient   Complaint: Cough/Sore throat Summary of Call: PT HAS A BUG AND JUST WANTS A SHOT. TOLD HER NOTHING OPEN.   [phone] pt said she would come at end of day and sit and wait. i told her I would send a note to the nurse cause they would have to see if she could be worked in.  Initial call taken by: Rudene Anda,  February 15, 2010 10:52 AM  Follow-up for Phone Call        sore throat, sneezing, low grade fever, runny nose with green drainage Follow-up by: Adella Hare LPN,  February 15, 2010 11:37 AM  Additional Follow-up for Phone Call Additional follow up Details #1::        advised pt to conme in for 1:45 appt today Additional Follow-up by: Syliva Overman MD,  February 15, 2010 12:19 PM

## 2010-04-01 ENCOUNTER — Telehealth (INDEPENDENT_AMBULATORY_CARE_PROVIDER_SITE_OTHER): Payer: Self-pay | Admitting: *Deleted

## 2010-04-01 DIAGNOSIS — K219 Gastro-esophageal reflux disease without esophagitis: Secondary | ICD-10-CM | POA: Insufficient documentation

## 2010-04-08 NOTE — Progress Notes (Signed)
Summary: WANTS TO SEE DR. Kendell Bane  Phone Note Call from Patient   Summary of Call: NEEDS A REFERRAL TO DR. Gildardo Griffes OFFICEFOOD WANTS TO COME BACK UP. sHE WOULD LIKE FOR THE APPOINMENT TO BE IN THE EVENING. pLEASE CALL WHEN DONE Initial call taken by: Lind Guest,  April 01, 2010 4:57 PM  Follow-up for Phone Call        ok to refer,. order entered Follow-up by: Syliva Overman MD,  April 01, 2010 5:24 PM  Additional Follow-up for Phone Call Additional follow up Details #1::        sent referral over to dr. Jeanella Flattery office. they will call pt with appt and time.  Additional Follow-up by: Rudene Anda,  April 02, 2010 11:26 AM  New Problems: GERD (ICD-530.81)   New Problems: GERD (ICD-530.81)

## 2010-05-22 ENCOUNTER — Other Ambulatory Visit: Payer: Self-pay | Admitting: Family Medicine

## 2010-05-26 ENCOUNTER — Ambulatory Visit (INDEPENDENT_AMBULATORY_CARE_PROVIDER_SITE_OTHER): Payer: BC Managed Care – PPO | Admitting: Internal Medicine

## 2010-05-26 DIAGNOSIS — K219 Gastro-esophageal reflux disease without esophagitis: Secondary | ICD-10-CM

## 2010-06-15 NOTE — Op Note (Signed)
Gail Cox, Gail Cox               ACCOUNT NO.:  [id number]   MEDICAL RECORD NO.:  [id number]          PATIENT TYPE:  AMB   LOCATION:  DAY                           FACILITY:  [facility]   PHYSICIAN:  Lionel December, M.D.    DATE OF BIRTH:  12-Dec-1965   DATE OF PROCEDURE:  07/11/2006  DATE OF DISCHARGE:                               OPERATIVE REPORT   PROCEDURE:  Colonoscopy.   INDICATIONS:  Danila is a 45 year old African American female who is  undergoing high risk screening colonoscopy.  She has one paternal aunt,  two paternal uncles, and paternal grandfather who all had colon  carcinoma.  Family history is also positive for other malignancies on  the father's side.  She, therefore, requested colonoscopy performed.  No  history of change in her bowel habits or rectal bleeding.  The procedure  risks were reviewed with the patient, informed consent was obtained.   MEDS FOR CONSCIOUS SEDATION:  Demerol 50 mg IV and Versed 6 mg IV in  divided doses.   FINDINGS:  The procedure was performed in the endoscopy suite.  The  patient's vital signs and O2 sat were monitored during the procedure and  remained stable.  The patient was placed in the left lateral position  and rectal examination performed.  No abnormality noted on external or  digital exam.  The Pentax videoscope was placed in the rectum and  advanced under vision into the sigmoid colon and beyond.  The  preparation was excellent.  The scope was passed into the cecum which  was identified by appendiceal orifice and ileocecal valve.  Pictures  were taken for the record.  As the scope was withdrawn, the colonic  mucosa was once again carefully examined and was normal throughout.  The  rectal mucosa, similarly, was normal.  The scope was retroflexed to  examine anorectal junction and small hemorrhoids were noted below the  dentate line.  The endoscope was then withdrawn.  The patient tolerated  the procedure well.   FINAL DIAGNOSIS:   Small external hemorrhoids, otherwise, normal  colonoscopy.   RECOMMENDATIONS:  Yearly Hemoccults and she may consider next screening  exam in five years from now.      Lionel December, M.D.  Electronically Signed     NR/MEDQ  D:  07/11/2006  T:  07/11/2006  Job:  [id number]   cc:   Milus Mallick. Lodema Hong, M.D.  Fax: [phone]

## 2010-06-16 ENCOUNTER — Emergency Department (HOSPITAL_COMMUNITY)
Admission: EM | Admit: 2010-06-16 | Discharge: 2010-06-16 | Disposition: A | Discharge status: Home / Self Care | Payer: Worker's Compensation | Attending: Emergency Medicine | Admitting: Emergency Medicine

## 2010-06-16 ENCOUNTER — Emergency Department (HOSPITAL_COMMUNITY): Payer: Worker's Compensation

## 2010-06-16 DIAGNOSIS — IMO0002 Reserved for concepts with insufficient information to code with codable children: Secondary | ICD-10-CM | POA: Insufficient documentation

## 2010-06-16 DIAGNOSIS — E039 Hypothyroidism, unspecified: Secondary | ICD-10-CM | POA: Insufficient documentation

## 2010-06-16 DIAGNOSIS — Z79899 Other long term (current) drug therapy: Secondary | ICD-10-CM | POA: Insufficient documentation

## 2010-06-16 DIAGNOSIS — K219 Gastro-esophageal reflux disease without esophagitis: Secondary | ICD-10-CM | POA: Insufficient documentation

## 2010-06-16 DIAGNOSIS — G43909 Migraine, unspecified, not intractable, without status migrainosus: Secondary | ICD-10-CM | POA: Insufficient documentation

## 2010-06-16 DIAGNOSIS — Y990 Civilian activity done for income or pay: Secondary | ICD-10-CM | POA: Insufficient documentation

## 2010-06-16 DIAGNOSIS — S0990XA Unspecified injury of head, initial encounter: Secondary | ICD-10-CM | POA: Insufficient documentation

## 2010-06-18 NOTE — Procedures (Signed)
NAME:  ROMAN, SANDALL                         ACCOUNT NO.:  [id number]   MEDICAL RECORD NO.:  [id number]                   PATIENT TYPE:  OUT   LOCATION:  RESP                                 FACILITY:  [facility]   PHYSICIAN:  Edward L. Juanetta Gosling, M.D.             DATE OF BIRTH:  09-May-1965   DATE OF PROCEDURE:  DATE OF DISCHARGE:                              PULMONARY FUNCTION TEST   FINDINGS:  1. Spirometry is normal.  2. Lung volumes show mild restrictive change, and very-slight air trapping.  3. DLCO is normal.      ___________________________________________                                            Oneal Deputy. Juanetta Gosling, M.D.   ELH/MEDQ  D:  09/11/2003  T:  09/12/2003  Job:  [id number]

## 2010-06-18 NOTE — H&P (Signed)
[facility]  Patient:    Gail Cox, Gail Cox Visit Number: [id number] MRN: [id number]          Service Type: SUR Location: 4A A418 01 Attending Physician:  Tilda Burrow Dictated by:   Christin Bach, M.D. Admit Date:  02/12/2001                           History and Physical  ADMITTING DIAGNOSES: 1. Uterine fibroids. 2. Endometriosis of right ovary. 3. Dysmenorrhea.  HISTORY OF PRESENT ILLNESS:  This 45 year old female, gravida 2, para 1, is admitted for abdominal hysterectomy and right salpingo-oophorectomy with possible removal of left tube and ovary dependent on clinical findings.  Gail Cox has been followed through our office for many years, with progressive fibroid enlargement of the uterus.  It now reaches 10 weeks size.  Gail Cox has sought resolution of her irregular and more prolonged periods.  She has seen Dr. Marcelle Overlie for a second opinion and has considered options including uterine artery embolization and has chosen to proceed with hysterectomy.  The patient has a history of endometrioma removed from the right ovary as well as a D&C and laparoscopic cholecystectomy.  MEDICAL HISTORY:  Essentially benign.  ALLERGIES:  None known.  MEDICATIONS:  None at present.  PHYSICAL EXAMINATION:  VITAL SIGNS:  Height 5 feet 10 inches.  Weight 166.  Blood pressure 120/80, pulse 70s.  Last menstrual period January 30, 2001.  GENERAL:  General exam shows a healthy African American, alert and oriented x 3.  HEENT:  Pupils equal, round and reactive.  Extraocular movements intact.  NECK:  Supple.  CHEST:  Clear to auscultation.  CARDIAC:  Regular rate and rhythm.  ABDOMEN:  Slim without masses.  PELVIC:  External genitalia normal.  Pelvic exam shows an anteflexed uterus, irregular, with 10-week-size estimated total size, well-supported, not a candidate for vaginal removal.  PLAN:  Total abdominal hysterectomy, right  salpingo-oophorectomy, possible removal of left ovary or appendix if clinical findings warrant. Dictated by:   Christin Bach, M.D. Attending Physician:  Tilda Burrow DD:  02/12/01 TD:  02/12/01 Job: [id number]

## 2010-06-18 NOTE — Op Note (Signed)
NAMEWENONA, MAYVILLE               ACCOUNT NO.:  [id number]   MEDICAL RECORD NO.:  [id number]          PATIENT TYPE:  AMB   LOCATION:  DSC                          FACILITY:  [facility]   PHYSICIAN:  Rose Phi. Maple Hudson, M.D.   DATE OF BIRTH:  July 25, 1965   DATE OF PROCEDURE:  02/08/2005  DATE OF DISCHARGE:                                 OPERATIVE REPORT   PREOPERATIVE DIAGNOSIS:  Left breast mass.   POSTOPERATIVE DIAGNOSIS:  Left breast mass.   OPERATION:  Excision of left breast mass with needle localization and  specimen mammogram.   SURGEON:  Rose Phi. Maple Hudson, M.D.   ANESTHESIA:  General.   OPERATIVE PROCEDURE:  Prior to coming to the operating room, a localizing  wire had been placed in the mass at the 3 o'clock position of the left  breast.   After suitable general anesthesia was induced, the patient was placed in the  supine position and the left breast prepped and draped in the usual fashion.  A radial incision was then outlined with a marking pencil incorporating the  previously-placed localizing wire.  Incision was made and a wide excision of  the wire and surrounding tissue was carried out.  With good hemostasis, a  subcuticular closure of 4-0 Monocryl and Steri-Strips was carried out.   Specimen mammogram confirmed the removal of the lesion.   Dressings were applied and the patient transferred to the recovery room in  satisfactory condition, having tolerated the procedure well.      Rose Phi. Maple Hudson, M.D.  Electronically Signed     PRY/MEDQ  D:  02/08/2005  T:  02/08/2005  Job:  [id number]

## 2010-06-18 NOTE — Op Note (Signed)
[facility]  Patient:    Gail Cox, Gail Cox Visit Number: [id number] MRN: [id number]          Service Type: SUR Location: 4A A418 01 Attending Physician:  Tilda Burrow Dictated by:   Christin Bach, M.D. Proc. Date: 02/12/01 Admit Date:  02/12/2001                             Operative Report  PREOPERATIVE DIAGNOSIS:  Uterine fibroids, endometriosis.  POSTOPERATIVE DIAGNOSIS:  Uterine fibroids, endometriosis.  PROCEDURE:  Total abdominal hysterectomy, right salpingo-oophorectomy.  SURGEON:  Christin Bach, M.D.  ASSISTANTAmie Critchley, C.S.T.  ANESTHESIA:  General by Nelda Severe, C.R.N.A.  COMPLICATIONS:  None.  FINDINGS:  Irregular fibroid uterus, 10-week size.  Adhesions of the right ovary to the pelvic sidewall, status post prior ovarian cystectomy.  No grossly visible endometriosis.  Normal bowel.  Normal-appearing left ovary.  DESCRIPTION OF PROCEDURE:  The patient was taken to the operating room, prepped and draped for lower abdominal surgery.  Pfannenstiel-type incision was performed with excision of a keloid where the old suprapubic laparoscopy port site was located.  The 12 cm incision allowed opening of the abdominal cavity in the midline without difficulty and bowel was packed away.  The uterus was elevated into the incision, with fundal fibroid inside the wall on the anterior portion of the uterus with a smaller fibroid posteriorly located.  Round ligaments which were substantial were taken down with two interrupted sutures and then transected between sutures and bladder flap developed anteriorly.  The left adnexa was inspected and found to be sufficiently healthy that preservation was considered a desirable option.  The right adnexa had previously mentioned adhesions to the sidewall from the prior cecostomy. The patient had the utero-ovarian ligament on the left isolated, clamped, cut and suture ligated with 0 chromic.  The left adnexa  was adherent to the sidewall and access was limited so we performed the hysterectomy first, leaving the ovary until later.  The utero-ovarian ligament/fallopian tube complex was on the right was similarly taken down and ligated.  The upper cardinal ligaments were clamped, cut and suture ligated on the either side incorporating uterine artery and vein in the pedicle.  The lower cardinal ligaments were then taken down serially with straight Heaney clamping, followed by knife dissection and 0 chromic suture ligature.  Upon reaching the level of the vaginal cuff angles, we made a stab incision in the anterior cervical vaginal fornix and then circumscribed the cervix off the vaginal cuff.  All the stitches were placed at each lateral vaginal angle to maintain cuff support.  The vagina was closed in the midline with a series of interrupted figure-of-eight sutures of 0 chromic.  The Aldridge stitch pedicles were tied together in the midline to improve mid pelvic support.  Hemostasis was inspected and a couple of areas on the back of cuff that continued to ooze required point cautery.  The pelvis was irrigated.  Hemostasis confirmed.  The pedicle at the left ovary was then attached to the round ligament remnants on the left side to keep the ovary out of the pelvis.  The pelvic floor itself was hemostatic.  Right tube and ovary were then taken out by skeletonizing the infundibulopelvic ligament on the right side, identifying the ureter and confirming it as being out of harms way, crossclamping the infundibulopelvic ligament, doubly ligating it and then removing the adnexal structure in its entirety.  The  pelvic floor was reperitonealized with a series of interrupted 2-0 chromic sutures to pull peritoneal surfaces together.  The laparotomy equipment was removed.  The pelvis was irrigated once again, the anterior peritoneum was closed using 2-0 chromic, the fascia closed with 0 Vicryl and the  subcutaneous tissues reapproximated with interrupted 2-0 plain after being confirmed as hemostatic and then staple closure of the skin completing the procedure. Dictated by:   Christin Bach, M.D. Attending Physician:  Tilda Burrow DD:  02/12/01 TD:  02/12/01 Job: [id number]

## 2010-06-18 NOTE — Discharge Summary (Signed)
[facility]  Patient:    Gail Cox, Gail Cox Visit Number: [id number] MRN: [id number]          Service Type: SUR Location: 4A A418 01 Attending Physician:  Tilda Burrow Dictated by:   Christin Bach, M.D. Admit Date:  02/12/2001 Discharge Date: 02/15/2001                             Discharge Summary  ADMITTING DIAGNOSES:  Uterine fibroids, endometriosis right ovary.  DISCHARGE DIAGNOSES:  Uterine fibroids, endometriosis.  PROCEDURE:  Total abdominal hysterectomy, right salpingo-oophorectomy February 12, 2001.  DISCHARGE MEDICATIONS: 1. Tylox one q.4h. p.r.n. pain dispensed 15. 2. Laxative of choice one p.o. q.d.  FOLLOWUP:  Four weeks for routine postoperative visit.  HISTORY OF PRESENT ILLNESS:  This 45 year old female is admitted for abdominal hysterectomy, right salpingo-oophorectomy, possible removal of left tube and ovary.  She has a history of fibroid enlargement of the uterus.  Second opinion obtained by the patient by Dr. Marcelle Overlie who agreed with surgical removal of the uterus instead of embolization of the fibroids.  The patient has a history of endometrioma on the right ovary with subsequent scarring and discomfort since the laparoscopic removal of the endometrioma in 2001.  The patient understands that absolute guarantees of pain resolution cannot be resolved, but that menstrual discomfort and perimenstrual pain should be dramatically altered.  PAST SURGICAL HISTORY:  Laparoscopic removal of endometrioma of the right ovary 2001, D&C 1993, laparoscopic cholecystectomy.  ALLERGIES:  No known drug allergies.  MEDICATIONS:  None.  PHYSICAL EXAMINATION  VITAL SIGNS:  Height 5 feet 10 inches, weight 166, blood pressure 120/80, pulse 70.  GENERAL:  Slim African-American female alert and oriented x3.  CARDIOVASCULAR:  Unremarkable.  ABDOMEN:  She is nontender without masses.  PELVIC:  External genitalia:  Normal.   Bimanual examination shows uterine fibroids consistent with 8-10 weeks size.  HOSPITAL COURSE:  Patient underwent total abdominal hysterectomy, right salpingo-oophorectomy on February 12, 2001, removing the uterus and right tube and ovary without difficulty with 200 cc blood loss.  Postoperatively the patient did well.  Had adequate urine output and responded to analgesics with pelvic discomfort similar to what she had experienced premenstrually.  The patient had adequate pain relief with the addition of IV Toradol.  Bowel sounds were hypoactive.  Postoperative day #2 the patient had tolerance of regular diet and was considered stable for discharge on postoperative day #3 with thigh and leg pain having resolved.  Abdomen soft.  Incision clean. Patient was stable for discharge.  ______.  Discharge hematocrit 33.5 compared to 37.5% on admission.  Electrolytes were normal. Dictated by:   Christin Bach, M.D. Attending Physician:  Tilda Burrow DD:  03/05/01 TD:  03/05/01 Job: [id number]

## 2010-08-04 ENCOUNTER — Other Ambulatory Visit: Payer: Self-pay | Admitting: Family Medicine

## 2010-10-19 ENCOUNTER — Other Ambulatory Visit: Payer: Self-pay | Admitting: Family Medicine

## 2010-11-02 ENCOUNTER — Other Ambulatory Visit: Payer: Self-pay

## 2010-11-02 ENCOUNTER — Other Ambulatory Visit: Payer: Self-pay | Admitting: Family Medicine

## 2010-11-02 DIAGNOSIS — E039 Hypothyroidism, unspecified: Secondary | ICD-10-CM

## 2010-11-03 LAB — TSH: TSH: 3.262 u[IU]/mL (ref 0.350–4.500)

## 2010-11-08 ENCOUNTER — Encounter: Payer: Self-pay | Admitting: Family Medicine

## 2010-11-09 ENCOUNTER — Telehealth: Payer: Self-pay | Admitting: *Deleted

## 2010-11-09 NOTE — Telephone Encounter (Signed)
Patient aware and states she has an appt scheduled

## 2010-11-09 NOTE — Telephone Encounter (Signed)
Message copied by Diamantina Monks on Tue Nov 09, 2010 10:24 AM ------      Message from: Syliva Overman MD E      Created: Mon Nov 08, 2010 10:24 AM       pls advise continue current dose of med for thyroid, also let her know I have also asked to  Schedule her an aoppt

## 2010-11-24 ENCOUNTER — Encounter: Payer: Self-pay | Admitting: Family Medicine

## 2010-11-25 ENCOUNTER — Ambulatory Visit: Payer: Self-pay | Admitting: Family Medicine

## 2010-11-26 ENCOUNTER — Ambulatory Visit (INDEPENDENT_AMBULATORY_CARE_PROVIDER_SITE_OTHER): Payer: BC Managed Care – PPO | Admitting: Family Medicine

## 2010-11-26 ENCOUNTER — Encounter: Payer: Self-pay | Admitting: Family Medicine

## 2010-11-26 VITALS — BP 120/84 | HR 80 | Resp 16 | Ht 65.0 in | Wt 175.1 lb

## 2010-11-26 DIAGNOSIS — R51 Headache: Secondary | ICD-10-CM

## 2010-11-26 DIAGNOSIS — R5383 Other fatigue: Secondary | ICD-10-CM

## 2010-11-26 DIAGNOSIS — Z13220 Encounter for screening for lipoid disorders: Secondary | ICD-10-CM

## 2010-11-26 DIAGNOSIS — R5381 Other malaise: Secondary | ICD-10-CM

## 2010-11-26 DIAGNOSIS — G43909 Migraine, unspecified, not intractable, without status migrainosus: Secondary | ICD-10-CM

## 2010-11-26 DIAGNOSIS — R079 Chest pain, unspecified: Secondary | ICD-10-CM | POA: Insufficient documentation

## 2010-11-26 DIAGNOSIS — M949 Disorder of cartilage, unspecified: Secondary | ICD-10-CM

## 2010-11-26 DIAGNOSIS — E039 Hypothyroidism, unspecified: Secondary | ICD-10-CM

## 2010-11-26 DIAGNOSIS — K3189 Other diseases of stomach and duodenum: Secondary | ICD-10-CM

## 2010-11-26 DIAGNOSIS — M899 Disorder of bone, unspecified: Secondary | ICD-10-CM

## 2010-11-26 DIAGNOSIS — R1013 Epigastric pain: Secondary | ICD-10-CM

## 2010-11-26 MED ORDER — ASPIRIN-ACETAMINOPHEN-CAFFEINE 250-250-65 MG PO TABS: ORAL_TABLET | ORAL | Status: DC

## 2010-11-26 NOTE — Patient Instructions (Addendum)
F/UI in  6 months.  Medication is sent in for your headaches , and you are also referred to the headache clinic.  Pls start execising/walking on your days off.  Congrats on weight loss.  Fasting labs as soon as possible.  Start OTC vit D3 800IU once daily.  Labs in 6 months before next visit.  You are referred to cardiology since you have an abnormal EKG with concerning symptoms

## 2010-11-26 NOTE — Progress Notes (Signed)
  Subjective:    Patient ID: Gail Cox, female    DOB: [birth year], 45 y.o.   MRN: [id number]  HPI 2 weeks ago, pt experienced severe epigastric pain, burning, rated at a 10 radiated up under the sternum, duaration approx 20 mins, associated with diaphoresis, pt felt as if she would die. Had a rept episode about later duration about 15 minutes, similar episode but not quite as intense. Had a migraine that morning.  Has had increased frequency and  severity of migraines in the past 6 months, inc work load , on avg 2 / month. Missing excedrine migraine which she states was the most effective States last week Wednesday to this Tuesday she had a headache which could not be relieved. Wants to see specialist    Review of Systems See HPI Denies recent fever or chills. Denies sinus pressure, nasal congestion, ear pain or sore throat. Denies chest congestion, productive cough or wheezing. Denies  palpitations and leg swelling Denies abdominal pain, nausea, vomiting,diarrhea or constipation.   Denies dysuria, frequency, hesitancy or incontinence. Denies joint pain, swelling and limitation in mobility. Denies seizures, numbness, or tingling. Denies depression, anxiety or insomnia. Denies skin break down or rash.        Objective:   Physical Exam Patient alert and oriented and in no cardiopulmonary distress.  HEENT: No facial asymmetry, EOMI, no sinus tenderness,  oropharynx pink and moist.  Neck supple no adenopathy.  Chest: Clear to auscultation bilaterally.  CVS: S1, S2 no murmurs, no S3.  ABD: Soft non tender. Bowel sounds normal.  Ext: No edema  MS: Adequate ROM spine, shoulders, hips and knees.  Skin: Intact, no ulcerations or rash noted.  Psych: Good eye contact, normal affect. Memory intact not anxious or depressed appearing.  CNS: CN 2-12 intact, power, tone and sensation normal throughout.        Assessment & Plan:

## 2010-11-26 NOTE — Assessment & Plan Note (Signed)
Controlled, no change in medication  

## 2010-11-27 NOTE — Assessment & Plan Note (Signed)
Recent episodes of chest pain with light headedness and diaphoresis and abnormal EKG, pt referred to cardiology for furhter eval

## 2010-11-27 NOTE — Assessment & Plan Note (Signed)
Uncontrolled, increase in frequency and severity, pt has script sent in for exccedrin migraine and is also being referred to neurology per her request

## 2010-11-30 LAB — BASIC METABOLIC PANEL
BUN: 8 mg/dL (ref 6–23)
CO2: 25 mEq/L (ref 19–32)
Calcium: 9.5 mg/dL (ref 8.4–10.5)
Creat: 0.67 mg/dL (ref 0.50–1.10)

## 2010-11-30 LAB — LIPID PANEL
Cholesterol: 181 mg/dL (ref 0–200)
HDL: 59 mg/dL (ref >39)
LDL Cholesterol: 109 mg/dL — ABNORMAL HIGH (ref 0–99)
Triglycerides: 66 mg/dL (ref <150)
VLDL: 13 mg/dL (ref 0–40)

## 2010-12-01 ENCOUNTER — Encounter: Payer: Self-pay | Admitting: Cardiology

## 2010-12-01 ENCOUNTER — Other Ambulatory Visit: Payer: Self-pay | Admitting: Family Medicine

## 2010-12-01 MED ORDER — VITAMIN D3 1.25 MG (50000 UT) PO CAPS: 50000.0000 [IU] | ORAL_CAPSULE | ORAL | Status: DC

## 2010-12-03 ENCOUNTER — Encounter: Payer: Self-pay | Admitting: Cardiology

## 2010-12-07 ENCOUNTER — Encounter: Payer: Self-pay | Admitting: Cardiology

## 2010-12-07 ENCOUNTER — Ambulatory Visit (INDEPENDENT_AMBULATORY_CARE_PROVIDER_SITE_OTHER): Payer: BC Managed Care – PPO | Admitting: Cardiology

## 2010-12-07 DIAGNOSIS — R030 Elevated blood-pressure reading, without diagnosis of hypertension: Secondary | ICD-10-CM

## 2010-12-07 DIAGNOSIS — R079 Chest pain, unspecified: Secondary | ICD-10-CM

## 2010-12-07 DIAGNOSIS — R0602 Shortness of breath: Secondary | ICD-10-CM

## 2010-12-07 DIAGNOSIS — K219 Gastro-esophageal reflux disease without esophagitis: Secondary | ICD-10-CM

## 2010-12-07 DIAGNOSIS — R9431 Abnormal electrocardiogram [ECG] [EKG]: Secondary | ICD-10-CM

## 2010-12-07 NOTE — Assessment & Plan Note (Signed)
Atypical in description. ECG shows nonspecific ST/T abnormalities. Blood pressure elevated today, although no long-standing history of hypertension. Premature CAD noted in a maternal grandmother. She has had some intermittent chest pain symptoms, although not as intense or prolonged as the episode last month. Plan is to proceed with an exercise echocardiogram for additional risk stratification. Her symptoms could be related to reflux or esophageal irritation. She does report a history of reflux, and uses Advil migraine for her headaches.

## 2010-12-07 NOTE — Assessment & Plan Note (Signed)
No long-standing history of hypertension. Recommend regular followup of blood pressure with Dr. Lodema Hong.

## 2010-12-07 NOTE — Assessment & Plan Note (Signed)
As noted, could also be related to her symptomatology.

## 2010-12-07 NOTE — Patient Instructions (Signed)
**Note De-identified Maudine Kluesner Obfuscation** Your physician has requested that you have a stress echocardiogram. For further information please visit [email or web address]. Please follow instruction sheet as given.  Your physician recommends that you continue on your current medications as directed. Please refer to the Current Medication list given to you today.  Your physician recommends that you schedule a follow-up appointment in: we will contact you with results of test  

## 2010-12-07 NOTE — Progress Notes (Signed)
Clinical Summary Gail Cox is a 45 y.o.female referred for cardiology consultation by Dr. Lodema Hong. She reports an episode of prolonged chest pain that occurred last month while she was working. She states that she had a typical migraine headache that morning, and took Advil migraine. Later that day she experienced a "sharp" chest pain ultimately associated with fatigue, increasing intensity, and subsequently diaphoresis. She states her symptoms gradually abated over a period of hours. She did not seek specific medical attention at that time, mentioned it to Dr. Lodema Hong at a subsequent office visit. She states that this has not occurred again, although she occasionally has a discomfort in her left shoulder and left upper chest area, sporadic and not exertional.  She reports a history of premature CAD in her maternal grandmother. Otherwise no personal history of hypertension, diabetes, hyperlipidemia. She also experiences reflux symptoms, does not take any specific medications for this.   Allergies  Allergen Reactions  . Hydrocodone-Acetaminophen     REACTION: nausea    Medication list reviewed.  Past Medical History  Diagnosis Date  . Allergic rhinitis, seasonal   . Hypothyroidism   . Migraine headache     Past Surgical History  Procedure Date  . Cholecystectomy 1999  . Ovarian cyst surgery 2003    Removed and uterus  . Thyroidectomy 2006  . Cystectomy 1977    Right arm  . Cystectomy 2007    Left breast    Family History  Problem Relation Age of Onset  . Lung cancer Mother   . Diabetes Father   . Coronary artery disease Maternal Grandmother     Social History Gail Cox reports that she has never smoked. She does not have any smokeless tobacco history on file. Gail Cox reports that she does not drink alcohol.  Review of Systems No palpitations, dizziness, syncope. No orthopnea or PND. Otherwise reviewed and negative except as outlined.  Physical Examination Filed  Vitals:   12/07/10 0925  BP: 145/92  Pulse: 77  Resp: 16   Normally nourished appearing woman in no acute distress. HEENT: Conjunctiva and lids normal, oropharynx with moist mucosa. Neck: Supple, no elevated JVP or carotid bruits, no thyromegaly. Lungs: Clear to auscultation, nonlabored. Cardiac: Regular rate and rhythm, no S3 gallop or rub. Abdomen: Soft, nontender, bowel sounds present. Skin: Warm and dry. Extremities: No pitting edema, distal pulses full. Musculoskeletal: No kyphosis. Neuropsychiatric: Alert and oriented x3, moves all extremities equally, affect appropriate.  ECG Normal sinus rhythm with nonspecific ST/T changes inferolateral leads.    Problem List and Plan

## 2010-12-15 ENCOUNTER — Ambulatory Visit (HOSPITAL_COMMUNITY)
Admission: RE | Admit: 2010-12-15 | Discharge: 2010-12-15 | Disposition: A | Discharge status: Home / Self Care | Payer: BC Managed Care – PPO | Source: Ambulatory Visit | Attending: Cardiology | Admitting: Cardiology

## 2010-12-15 ENCOUNTER — Encounter (HOSPITAL_COMMUNITY): Payer: Self-pay | Admitting: Cardiology

## 2010-12-15 DIAGNOSIS — R0602 Shortness of breath: Secondary | ICD-10-CM

## 2010-12-15 DIAGNOSIS — R079 Chest pain, unspecified: Secondary | ICD-10-CM | POA: Insufficient documentation

## 2010-12-15 DIAGNOSIS — R9431 Abnormal electrocardiogram [ECG] [EKG]: Secondary | ICD-10-CM

## 2010-12-15 DIAGNOSIS — R072 Precordial pain: Secondary | ICD-10-CM

## 2010-12-15 NOTE — Progress Notes (Signed)
Stress Lab Nurses Notes - Gail Cox  Gail Cox 12/15/2010  Reason for doing test: Chest Pain  Type of test: Stress Echo  Nurse performing test: Parke Poisson, RN  Nuclear Medicine Tech: Not Applicable  Echo Tech: Karrie Doffing  MD performing test: R. Dietrich Pates  Family MD: Lodema Hong  Test explained and consent signed: yes  IV started: No IV started  Symptoms: Fatigue  Treatment/Intervention: None  Reason test stopped: reached target HR  After recovery IV was: NA  Patient to return to Nuc. Med at : NA  Patient discharged: Home  Patient's Condition upon discharge was: stable  Comments: During Test peak BP 162/72 & HR 159.   Recovery BP 128/70 & HR 82.  Symptoms resolved in recovery.  [name]

## 2010-12-15 NOTE — Progress Notes (Signed)
*  PRELIMINARY RESULTS* Echocardiogram Echocardiogram Stress Test has been performed.  Gail Cox 12/15/2010, 9:54 AM

## 2010-12-16 ENCOUNTER — Encounter: Payer: Self-pay | Admitting: Family Medicine

## 2010-12-30 ENCOUNTER — Telehealth: Payer: Self-pay | Admitting: Family Medicine

## 2010-12-30 MED ORDER — FIRST-DUKES MOUTHWASH MT SUSP: OROMUCOSAL | Status: DC

## 2010-12-30 NOTE — Telephone Encounter (Signed)
Patient aware.

## 2010-12-30 NOTE — Telephone Encounter (Signed)
pls send dukes magic mouthwash 10cc three times daily gargke swish and swallow x 300cc, advice tylenol, 1 to 2 per day x 3 days,and voice rest,.

## 2010-12-30 NOTE — Telephone Encounter (Signed)
Throat hurts pretty bad but she feels ok. Having some clear sinus drainage down her throat and has tried taking some OTC products and warm tea but its still hurts. No appts. Wants to know if you can send her something for it

## 2011-01-07 ENCOUNTER — Other Ambulatory Visit: Payer: Self-pay | Admitting: Family Medicine

## 2011-02-07 ENCOUNTER — Other Ambulatory Visit: Payer: Self-pay | Admitting: Family Medicine

## 2011-02-07 DIAGNOSIS — Z139 Encounter for screening, unspecified: Secondary | ICD-10-CM

## 2011-02-09 ENCOUNTER — Telehealth: Payer: Self-pay | Admitting: Family Medicine

## 2011-02-09 DIAGNOSIS — R5381 Other malaise: Secondary | ICD-10-CM

## 2011-02-09 DIAGNOSIS — Z13220 Encounter for screening for lipoid disorders: Secondary | ICD-10-CM

## 2011-02-09 DIAGNOSIS — E039 Hypothyroidism, unspecified: Secondary | ICD-10-CM

## 2011-02-09 NOTE — Telephone Encounter (Signed)
Will forward 

## 2011-02-09 NOTE — Telephone Encounter (Signed)
noted 

## 2011-02-24 ENCOUNTER — Other Ambulatory Visit: Payer: Self-pay | Admitting: Family Medicine

## 2011-03-03 ENCOUNTER — Ambulatory Visit (HOSPITAL_COMMUNITY): Payer: BC Managed Care – PPO

## 2011-03-03 ENCOUNTER — Other Ambulatory Visit: Payer: Self-pay | Admitting: Family Medicine

## 2011-03-03 ENCOUNTER — Ambulatory Visit
Admission: RE | Admit: 2011-03-03 | Discharge: 2011-03-03 | Disposition: A | Discharge status: Home / Self Care | Payer: BC Managed Care – PPO | Source: Ambulatory Visit | Attending: Family Medicine | Admitting: Family Medicine

## 2011-03-03 DIAGNOSIS — Z1231 Encounter for screening mammogram for malignant neoplasm of breast: Secondary | ICD-10-CM

## 2011-04-04 ENCOUNTER — Telehealth: Payer: Self-pay | Admitting: Family Medicine

## 2011-04-04 NOTE — Telephone Encounter (Signed)
Not b12, she wants to cut back on the vitamin D. She takes the once weekly Vit D 50000 and also Vit D 1000 daily. Can she reduce or stop one due to the constipation?

## 2011-04-04 NOTE — Telephone Encounter (Signed)
Spoke with pt and she is aware of the orders as well as the need for blood draw today.

## 2011-04-04 NOTE — Telephone Encounter (Signed)
pls order a vit D level , let her know I will advise when results are in I will let her know, in the meantime , she should stop the daily and just take prescription weekly, get lab today /asap please, do not order asa stat however, she just needs an informed response soon

## 2011-04-05 LAB — VITAMIN D 25 HYDROXY (VIT D DEFICIENCY, FRACTURES): Vit D, 25-Hydroxy: 62 ng/mL (ref 30–89)

## 2011-04-15 ENCOUNTER — Telehealth: Payer: Self-pay | Admitting: Family Medicine

## 2011-04-18 ENCOUNTER — Telehealth: Payer: Self-pay | Admitting: Family Medicine

## 2011-04-18 NOTE — Telephone Encounter (Signed)
Tried to contact pt, left a message to call nurse.Please advie her to use OTC stool softeners daily, increase fiber intake and water. Use OTC laxative magnesium citrate per directions on bottle, may rept if no BM, and also may use a dulcolax suppository. If no BM continues may need eval in office to involve abdominal x ray etc

## 2011-04-18 NOTE — Telephone Encounter (Signed)
Pt aware.

## 2011-04-18 NOTE — Telephone Encounter (Signed)
Spoke with pt and she is aware of previous message.

## 2011-05-24 ENCOUNTER — Ambulatory Visit (INDEPENDENT_AMBULATORY_CARE_PROVIDER_SITE_OTHER): Payer: BC Managed Care – PPO | Admitting: Family Medicine

## 2011-05-24 ENCOUNTER — Encounter: Payer: Self-pay | Admitting: Family Medicine

## 2011-05-24 VITALS — BP 108/78 | HR 71 | Resp 16 | Ht 65.0 in | Wt 173.0 lb

## 2011-05-24 DIAGNOSIS — J301 Allergic rhinitis due to pollen: Secondary | ICD-10-CM

## 2011-05-24 DIAGNOSIS — E039 Hypothyroidism, unspecified: Secondary | ICD-10-CM

## 2011-05-24 DIAGNOSIS — Z13220 Encounter for screening for lipoid disorders: Secondary | ICD-10-CM

## 2011-05-24 DIAGNOSIS — Z1211 Encounter for screening for malignant neoplasm of colon: Secondary | ICD-10-CM

## 2011-05-24 DIAGNOSIS — R5381 Other malaise: Secondary | ICD-10-CM

## 2011-05-24 DIAGNOSIS — G43909 Migraine, unspecified, not intractable, without status migrainosus: Secondary | ICD-10-CM

## 2011-05-24 DIAGNOSIS — R7301 Impaired fasting glucose: Secondary | ICD-10-CM

## 2011-05-24 LAB — TSH: TSH: 1.991 u[IU]/mL (ref 0.350–4.500)

## 2011-05-24 LAB — POC HEMOCCULT BLD/STL (OFFICE/1-CARD/DIAGNOSTIC): Fecal Occult Blood, POC: NEGATIVE

## 2011-05-24 MED ORDER — FLUTICASONE PROPIONATE 50 MCG/ACT NA SUSP: 2.0000 | Freq: Every day | NASAL | Status: DC

## 2011-05-24 NOTE — Assessment & Plan Note (Signed)
Adequate replacement, no change in medication

## 2011-05-24 NOTE — Progress Notes (Signed)
  Subjective:    Patient ID: Gail Cox, female    DOB: 1965/04/10, 46 y.o.   MRN: [id number]  HPI The PT is here for follow up and re-evaluation of chronic medical conditions, medication management and review of any available recent lab and radiology data.  Preventive health is updated, specifically  Cancer screening and Immunization.   Questions or concerns regarding consultations or procedures which the PT has had in the interim are  Addressed.She ha had sucessful visits to the headache clinic with relief in her headaches. She has been getting injections for pain relief which have been succesful The PT denies any adverse reactions to current medications since the last visit.  There are no new concerns.  C/o increased nasal congestion with clear drainage with the change in the season, also sneezing and watery eyes. No regular exercise "too busy", still working on dietary change    Review of Systems See HPI Denies recent fever or chills. Denies sinus pressure,  ear pain or sore throat. Denies chest congestion, productive cough or wheezing. Denies chest pains, palpitations and leg swelling Denies abdominal pain, nausea, vomiting,diarrhea or constipation.   Denies dysuria, frequency, hesitancy or incontinence. Denies joint pain, swelling and limitation in mobility. Denies headaches, seizures, numbness, or tingling. Denies depression, anxiety or insomnia. Denies skin break down or rash.        Objective:   Physical Exam Patient alert and oriented and in no cardiopulmonary distress.  HEENT: No facial asymmetry, EOMI, no sinus tenderness,  oropharynx pink and moist.  Neck supple no adenopathy.  Chest: Clear to auscultation bilaterally.  CVS: S1, S2 no murmurs, no S3.  ABD: Soft non tender. Bowel sounds normal.  Ext: No edema  MS: Adequate ROM spine, shoulders, hips and knees.  Skin: Intact, no ulcerations or rash noted.  Psych: Good eye contact, normal affect. Memory  intact not anxious or depressed appearing.  CNS: CN 2-12 intact, power, tone and sensation normal throughout.        Assessment & Plan:

## 2011-05-24 NOTE — Assessment & Plan Note (Signed)
Marked improvement since being treated at the headache clinic

## 2011-05-24 NOTE — Patient Instructions (Addendum)
F/u in 6 month  I am very happy that your headaches have improved  Fasting lipid, blood sugar, TSH in 6 months.  It is important that you exercise regularly at least 30 minutes 5 times a week. If you develop chest pain, have severe difficulty breathing, or feel very tired, stop exercising immediately and seek medical attention   Flonase is sent in for allergies   Continue current dose of synthyroid  Weight loss goal of 5 pounds.   Rectal exam today

## 2011-06-13 NOTE — Assessment & Plan Note (Signed)
Symptom flare pt to resume flonase

## 2011-07-05 ENCOUNTER — Encounter (INDEPENDENT_AMBULATORY_CARE_PROVIDER_SITE_OTHER): Payer: Self-pay | Admitting: *Deleted

## 2011-07-13 ENCOUNTER — Other Ambulatory Visit (INDEPENDENT_AMBULATORY_CARE_PROVIDER_SITE_OTHER): Payer: Self-pay | Admitting: *Deleted

## 2011-07-13 ENCOUNTER — Telehealth: Payer: Self-pay | Admitting: Family Medicine

## 2011-07-13 ENCOUNTER — Telehealth (INDEPENDENT_AMBULATORY_CARE_PROVIDER_SITE_OTHER): Payer: Self-pay | Admitting: *Deleted

## 2011-07-13 DIAGNOSIS — Z1211 Encounter for screening for malignant neoplasm of colon: Secondary | ICD-10-CM

## 2011-07-13 DIAGNOSIS — Z800 Family history of malignant neoplasm of digestive organs: Secondary | ICD-10-CM

## 2011-07-13 NOTE — Telephone Encounter (Signed)
Noted  

## 2011-07-13 NOTE — Telephone Encounter (Signed)
Noted she needs to go

## 2011-07-13 NOTE — Telephone Encounter (Signed)
Patient needs movi prep 

## 2011-07-15 MED ORDER — PEG-KCL-NACL-NASULF-NA ASC-C 100 G PO SOLR: 1.0000 | Freq: Once | ORAL | Status: DC

## 2011-08-22 ENCOUNTER — Telehealth (INDEPENDENT_AMBULATORY_CARE_PROVIDER_SITE_OTHER): Payer: Self-pay | Admitting: *Deleted

## 2011-08-22 NOTE — Telephone Encounter (Signed)
agree

## 2011-08-22 NOTE — Telephone Encounter (Signed)
PCP/Requesting MD: simpson  Name & DOB: Gail Cox 10/29/2065     Procedure: tcs  Reason/Indication:  fam hx colon ca  Has patient had this procedure before?  yes  If so, when, by whom and where?  2008  Is there a family history of colon cancer?  yes  Who?  What age when diagnosed?  Grandfather, uncles, aunts  Is patient diabetic?   no      Does patient have prosthetic heart valve?  no  Do you have a pacemaker?  no  Has patient had joint replacement within last 12 months?  no  Is patient on Coumadin, Plavix and/or Aspirin? no  Medications: synthroid 125 mg daily, otc sleep aid, topromax 25 mg tid (migraines)  Allergies: vicodine  Medication Adjustment:   Procedure date & time: 09/21/11 at 830

## 2011-09-02 ENCOUNTER — Other Ambulatory Visit: Payer: Self-pay | Admitting: Family Medicine

## 2011-09-13 ENCOUNTER — Encounter (HOSPITAL_COMMUNITY): Payer: Self-pay | Admitting: Pharmacy Technician

## 2011-09-21 ENCOUNTER — Ambulatory Visit (HOSPITAL_COMMUNITY)
Admission: RE | Admit: 2011-09-21 | Discharge: 2011-09-21 | Disposition: A | Discharge status: Home / Self Care | Payer: BC Managed Care – PPO | Source: Ambulatory Visit | Attending: Internal Medicine | Admitting: Internal Medicine

## 2011-09-21 ENCOUNTER — Encounter (HOSPITAL_COMMUNITY)
Admission: RE | Disposition: A | Discharge status: Home / Self Care | Payer: Self-pay | Source: Ambulatory Visit | Attending: Internal Medicine

## 2011-09-21 ENCOUNTER — Encounter (HOSPITAL_COMMUNITY): Payer: Self-pay | Admitting: *Deleted

## 2011-09-21 DIAGNOSIS — K6389 Other specified diseases of intestine: Secondary | ICD-10-CM

## 2011-09-21 DIAGNOSIS — Z800 Family history of malignant neoplasm of digestive organs: Secondary | ICD-10-CM

## 2011-09-21 DIAGNOSIS — Z1211 Encounter for screening for malignant neoplasm of colon: Secondary | ICD-10-CM | POA: Insufficient documentation

## 2011-09-21 DIAGNOSIS — Z83719 Family history of colon polyps, unspecified: Secondary | ICD-10-CM

## 2011-09-21 DIAGNOSIS — Z8371 Family history of colonic polyps: Secondary | ICD-10-CM

## 2011-09-21 HISTORY — PX: COLONOSCOPY: SHX5424

## 2011-09-21 SURGERY — COLONOSCOPY
Anesthesia: Moderate Sedation

## 2011-09-21 MED ORDER — STERILE WATER FOR IRRIGATION IR SOLN
Status: DC | PRN
  Administered 2011-09-21: 09:00:00

## 2011-09-21 MED ORDER — SODIUM CHLORIDE 0.45 % IV SOLN
Freq: Once | INTRAVENOUS | Status: AC
  Administered 2011-09-21: 1000 mL via INTRAVENOUS

## 2011-09-21 MED ORDER — MEPERIDINE HCL 50 MG/ML IJ SOLN
INTRAMUSCULAR | Status: DC | PRN
  Administered 2011-09-21 (×2): 25 mg via INTRAVENOUS

## 2011-09-21 MED ORDER — MIDAZOLAM HCL 5 MG/5ML IJ SOLN
INTRAMUSCULAR | Status: AC
  Filled 2011-09-21: qty 10

## 2011-09-21 MED ORDER — MIDAZOLAM HCL 5 MG/5ML IJ SOLN
INTRAMUSCULAR | Status: DC | PRN
  Administered 2011-09-21 (×3): 2 mg via INTRAVENOUS

## 2011-09-21 MED ORDER — MEPERIDINE HCL 50 MG/ML IJ SOLN
INTRAMUSCULAR | Status: AC
  Filled 2011-09-21: qty 1

## 2011-09-21 NOTE — Progress Notes (Signed)
Tap water enema given per MD order. Pt tol well.  Liquid bowel return.  Will cont to monitor.

## 2011-09-21 NOTE — Op Note (Signed)
COLONOSCOPY PROCEDURE REPORT  PATIENT:  Gail Cox  MR#:  [id number] Birthdate:  30-Sep-1965, 46 y.o., female Endoscopist:  Dr. Malissa Hippo, MD Referred By:  Dr. Syliva Overman, MD Procedure Date: 09/21/2011  Procedure:   Colonoscopy  Indications: Patient is a 46 year old African American female who is undergoing high-risk screening colonoscopy. She has four second-degree relatives with history of colon carcinoma and half brother with multiple polyps.  Informed Consent:  The procedure and risks were reviewed with the patient and informed consent was obtained.  Medications:  Demerol 50 mg IV Versed 6 mg IV  Description of procedure:  After a digital rectal exam was performed, that colonoscope was advanced from the anus through the rectum and colon to the area of the cecum, ileocecal valve and appendiceal orifice. The cecum was deeply intubated. These structures were well-seen and photographed for the record. From the level of the cecum and ileocecal valve, the scope was slowly and cautiously withdrawn. The mucosal surfaces were carefully surveyed utilizing scope tip to flexion to facilitate fold flattening as needed. The scope was pulled down into the rectum where a thorough exam including retroflexion was performed.  Findings:   Prep fair to satisfactory. Normal mucosa of the colon throughout. Normal rectal mucosa. Small anal papilla.  Therapeutic/Diagnostic Maneuvers Performed:  None  Complications:  None  Cecal Withdrawal Time:  8 minutes  Impression:  Examination performed to cecum. Small anal papilla otherwise normal colonoscopy.  Recommendations:  Standard instructions given. Next screening exam in 5 years.  REHMAN,NAJEEB U  09/21/2011 9:03 AM  CC: Dr. Syliva Overman, MD & Dr. Bonnetta Barry ref. provider found

## 2011-09-21 NOTE — H&P (Signed)
Gail Cox is an 46 y.o. female.   Chief Complaint: Patient is here for colonoscopy. HPI: Patient is 46 year old female who is in for screening colonoscopy. She denies abdominal pain rectal bleeding or change in bowel habits. Patient's last colonoscopy was  5 years ago was normal.  family history is significant for colon carcinoma in paternal grandfather 2 paternal uncles and paternal aunt. Her aunt was in her 76s and other relatives her older. He has one half-brother who has had multiple polyps removed  Past Medical History  Diagnosis Date  . Allergic rhinitis, seasonal   . Hypothyroidism   . Migraine headache     Past Surgical History  Procedure Date  . Cholecystectomy 1999  . Ovarian cyst surgery 2003    Removed and uterus  . Thyroidectomy 2006  . Cystectomy 1977    Right arm  . Cystectomy 2007    Left breast    Family History  Problem Relation Age of Onset  . Lung cancer Mother   . Diabetes Father   . Coronary artery disease Maternal Grandmother    Social History:  reports that she has never smoked. She does not have any smokeless tobacco history on file. She reports that she does not drink alcohol or use illicit drugs.  Allergies:  Allergies  Allergen Reactions  . Hydrocodone-Acetaminophen     REACTION: nausea    Medications Prior to Admission  Medication Sig Dispense Refill  . Aspirin-Acetaminophen-Caffeine (MIGRAINE RELIEF PO) Take 1 tablet by mouth as needed. For migraine      . diphenhydramine-acetaminophen (TYLENOL PM) 25-500 MG TABS Take 1 tablet by mouth at bedtime as needed. For migraine      . Doxylamine Succinate, Sleep, (SLEEP AID PO) Take 1-2 tablets by mouth as needed. For sleep      . peg 3350 powder (MOVIPREP) 100 G SOLR Take 1 kit (100 g total) by mouth once.  1 kit  0  . SYNTHROID 125 MCG tablet TAKE ONE TABLET BY MOUTH EVERY DAY  30 each  3  . topiramate (TOPAMAX) 25 MG tablet Take 25 mg by mouth 3 (three) times daily.      [name] albuterol  (PROVENTIL HFA;VENTOLIN HFA) 108 (90 BASE) MCG/ACT inhaler Inhale 2 puffs into the lungs every 6 (six) hours as needed. For shortness of breath      . fluticasone (FLONASE) 50 MCG/ACT nasal spray Place 2 sprays into the nose daily as needed. For allergies        No results found for this or any previous visit (from the past 48 hour(s)). No results found.  ROS  Blood pressure 158/103, pulse 81, temperature 98.4 F (36.9 C), temperature source Oral, resp. rate 18, height 5\' 5"  (1.651 m), weight 172 lb (78.019 kg), SpO2 97.00%. Physical Exam  Constitutional: She appears well-developed and well-nourished.  HENT:  Mouth/Throat: Oropharynx is clear and moist.  Eyes: Conjunctivae are normal. No scleral icterus.  Neck: No thyromegaly present.  Cardiovascular: Normal rate, regular rhythm and normal heart sounds.   No murmur heard. Respiratory: Effort normal and breath sounds normal.  GI: Soft. She exhibits no distension and no mass. There is no tenderness.  Musculoskeletal: She exhibits no edema.  Lymphadenopathy:    She has no cervical adenopathy.  Neurological: She is alert.  Skin: Skin is warm and dry.     Assessment/Plan High-risk screening colonoscopy. Double second-degree relatives with history of colon carcinoma and half-brother with multiple polyps.  REHMAN,NAJEEB U 09/21/2011, 8:30 AM

## 2011-09-23 ENCOUNTER — Encounter (HOSPITAL_COMMUNITY): Payer: Self-pay | Admitting: Internal Medicine

## 2011-11-03 ENCOUNTER — Encounter: Payer: Self-pay | Admitting: Family Medicine

## 2011-11-03 ENCOUNTER — Other Ambulatory Visit: Payer: Self-pay | Admitting: Family Medicine

## 2011-11-03 ENCOUNTER — Ambulatory Visit (INDEPENDENT_AMBULATORY_CARE_PROVIDER_SITE_OTHER): Payer: BC Managed Care – PPO | Admitting: Family Medicine

## 2011-11-03 VITALS — BP 140/90 | HR 74 | Resp 18 | Ht 65.0 in | Wt 177.0 lb

## 2011-11-03 DIAGNOSIS — E785 Hyperlipidemia, unspecified: Secondary | ICD-10-CM

## 2011-11-03 DIAGNOSIS — J0100 Acute maxillary sinusitis, unspecified: Secondary | ICD-10-CM | POA: Insufficient documentation

## 2011-11-03 DIAGNOSIS — Z6825 Body mass index (BMI) 25.0-25.9, adult: Secondary | ICD-10-CM

## 2011-11-03 DIAGNOSIS — E039 Hypothyroidism, unspecified: Secondary | ICD-10-CM

## 2011-11-03 DIAGNOSIS — R030 Elevated blood-pressure reading, without diagnosis of hypertension: Secondary | ICD-10-CM

## 2011-11-03 DIAGNOSIS — J301 Allergic rhinitis due to pollen: Secondary | ICD-10-CM

## 2011-11-03 DIAGNOSIS — E663 Overweight: Secondary | ICD-10-CM

## 2011-11-03 DIAGNOSIS — G43909 Migraine, unspecified, not intractable, without status migrainosus: Secondary | ICD-10-CM

## 2011-11-03 MED ORDER — PENICILLIN V POTASSIUM 500 MG PO TABS: 500.0000 mg | ORAL_TABLET | Freq: Three times a day (TID) | ORAL | Status: DC

## 2011-11-03 NOTE — Progress Notes (Signed)
  Subjective:    Patient ID: Gail Cox, female    DOB: Aug 05, 1965, 46 y.o.   MRN: [id number]  HPI The PT is here for follow up and re-evaluation of chronic medical conditions, medication management and review of any available recent lab and radiology data.  Preventive health is updated, specifically  Cancer screening and Immunization.   Questions or concerns regarding consultations or procedures which the PT has had in the interim are  addressed. The PT denies any adverse reactions to current medications since the last visit.  3 day h/o increased sinus pressure over cheeks, with foul tasting drainage with intermittent chills, no fever, cough or sore throat Still no commitment to regular physical activity, she has also gained weight     Review of Systems See HPI  Denies chest pains, palpitations and leg swelling Denies abdominal pain, nausea, vomiting,diarrhea or constipation.   Denies dysuria, frequency, hesitancy or incontinence. Denies joint pain, swelling and limitation in mobility. Denies headaches, seizures, numbness, or tingling. Denies depression, anxiety or insomnia. Denies skin break down or rash.        Objective:   Physical Exam Patient alert and oriented and in no cardiopulmonary distress.  HEENT: No facial asymmetry, EOMI, maxillary  sinus tenderness,  oropharynx pink and moist.  Neck supple no adenopathy.  Chest: Clear to auscultation bilaterally.  CVS: S1, S2 no murmurs, no S3.  ABD: Soft non tender. Bowel sounds normal.  Ext: No edema  MS: Adequate ROM spine, shoulders, hips and knees.  Skin: Intact, no ulcerations or rash noted.  Psych: Good eye contact, normal affect. Memory intact not anxious or depressed appearing.  CNS: CN 2-12 intact, power, tone and sensation normal throughout.        Assessment & Plan:

## 2011-11-03 NOTE — Patient Instructions (Addendum)
F/u in  January.Call if you need me before please  Blood pressure is elevated at this visit, you need to make lifestyle changes as discussed to improve this  It is important that you exercise regularly at least 30 minutes 5 times a week. If you develop chest pain, have severe difficulty breathing, or feel very tired, stop exercising immediately and seek medical attention   A healthy diet is rich in fruit, vegetables and whole grains. Poultry fish, nuts and beans are a healthy choice for protein rather then red meat. A low sodium diet and drinking 64 ounces of water daily is generally recommended. Oils and sweet should be limited. Carbohydrates especially for those who are diabetic or overweight, should be limited to 30-45 gram per meal. It is important to eat on a regular schedule, at least 3 times daily. Snacks should be primarily fruits, vegetables or nuts.    Weight loss goal of 5 to 7 pounds Please get flu vaccine.  Medication sent for sinus infection

## 2011-11-04 LAB — LIPID PANEL
HDL: 57 mg/dL (ref >39)
Total CHOL/HDL Ratio: 3.5 Ratio
VLDL: 11 mg/dL (ref 0–40)

## 2011-11-04 LAB — GLUCOSE, RANDOM: Glucose, Bld: 92 mg/dL (ref 70–99)

## 2011-11-05 DIAGNOSIS — E785 Hyperlipidemia, unspecified: Secondary | ICD-10-CM | POA: Insufficient documentation

## 2011-11-05 DIAGNOSIS — E663 Overweight: Secondary | ICD-10-CM | POA: Insufficient documentation

## 2011-11-05 DIAGNOSIS — R030 Elevated blood-pressure reading, without diagnosis of hypertension: Secondary | ICD-10-CM | POA: Insufficient documentation

## 2011-11-05 NOTE — Assessment & Plan Note (Signed)
Deteriorated, with increased LDL.  The importance of dietary change to reduce risk of heart disease is stressed

## 2011-11-05 NOTE — Assessment & Plan Note (Signed)
Increased symptoms with season change, good response to nasal steroid

## 2011-11-05 NOTE — Assessment & Plan Note (Signed)
Improved since going to headache clinic, she has been getting injections in the neck which have helped

## 2011-11-05 NOTE — Assessment & Plan Note (Signed)
Antibiotic course prescribed 

## 2011-11-05 NOTE — Assessment & Plan Note (Signed)
Controlled, no change in medication  

## 2011-11-05 NOTE — Assessment & Plan Note (Signed)
DASH diet and commitment to daily physical activity for a minimum of 30 minutes discussed and encouraged, as a part of hypertension management. The importance of attaining a healthy weight is also discussed. No medication at this visit, will follow up

## 2012-02-06 ENCOUNTER — Other Ambulatory Visit: Payer: Self-pay | Admitting: Family Medicine

## 2012-02-06 DIAGNOSIS — Z1231 Encounter for screening mammogram for malignant neoplasm of breast: Secondary | ICD-10-CM

## 2012-02-27 ENCOUNTER — Ambulatory Visit (INDEPENDENT_AMBULATORY_CARE_PROVIDER_SITE_OTHER): Payer: BC Managed Care – PPO | Admitting: Family Medicine

## 2012-02-27 ENCOUNTER — Encounter: Payer: Self-pay | Admitting: Family Medicine

## 2012-02-27 VITALS — BP 126/84 | HR 77 | Temp 99.0°F | Resp 16 | Ht 65.0 in | Wt 177.4 lb

## 2012-02-27 DIAGNOSIS — J0100 Acute maxillary sinusitis, unspecified: Secondary | ICD-10-CM

## 2012-02-27 DIAGNOSIS — E039 Hypothyroidism, unspecified: Secondary | ICD-10-CM

## 2012-02-27 DIAGNOSIS — E785 Hyperlipidemia, unspecified: Secondary | ICD-10-CM

## 2012-02-27 DIAGNOSIS — J302 Other seasonal allergic rhinitis: Secondary | ICD-10-CM | POA: Insufficient documentation

## 2012-02-27 DIAGNOSIS — J301 Allergic rhinitis due to pollen: Secondary | ICD-10-CM

## 2012-02-27 DIAGNOSIS — R7301 Impaired fasting glucose: Secondary | ICD-10-CM

## 2012-02-27 DIAGNOSIS — R5383 Other fatigue: Secondary | ICD-10-CM

## 2012-02-27 DIAGNOSIS — R5381 Other malaise: Secondary | ICD-10-CM

## 2012-02-27 DIAGNOSIS — J309 Allergic rhinitis, unspecified: Secondary | ICD-10-CM

## 2012-02-27 DIAGNOSIS — J029 Acute pharyngitis, unspecified: Secondary | ICD-10-CM

## 2012-02-27 DIAGNOSIS — J45909 Unspecified asthma, uncomplicated: Secondary | ICD-10-CM | POA: Insufficient documentation

## 2012-02-27 LAB — CBC
HCT: 40.4 % (ref 36.0–46.0)
MCH: 28.9 pg (ref 26.0–34.0)
MCV: 85.8 fL (ref 78.0–100.0)
RBC: 4.71 MIL/uL (ref 3.87–5.11)
RDW: 13.5 % (ref 11.5–15.5)
WBC: 5.3 10*3/uL (ref 4.0–10.5)

## 2012-02-27 LAB — BASIC METABOLIC PANEL
CO2: 26 mEq/L (ref 19–32)
Glucose, Bld: 88 mg/dL (ref 70–99)
Potassium: 4.1 mEq/L (ref 3.5–5.3)
Sodium: 140 mEq/L (ref 135–145)

## 2012-02-27 MED ORDER — BUDESONIDE-FORMOTEROL FUMARATE 80-4.5 MCG/ACT IN AERO: 2.0000 | INHALATION_SPRAY | Freq: Two times a day (BID) | RESPIRATORY_TRACT | Status: DC

## 2012-02-27 MED ORDER — AZITHROMYCIN 250 MG PO TABS: ORAL_TABLET | ORAL | Status: AC

## 2012-02-27 MED ORDER — LORATADINE 10 MG PO TABS: 10.0000 mg | ORAL_TABLET | Freq: Every day | ORAL | Status: DC

## 2012-02-27 MED ORDER — PREDNISONE (PAK) 5 MG PO TABS: ORAL_TABLET | ORAL | Status: AC

## 2012-02-27 NOTE — Assessment & Plan Note (Signed)
Uncontrolled, prednisone dose pack prescribed 

## 2012-02-27 NOTE — Patient Instructions (Addendum)
F/u in 6 month  Weight loss goal of 6 to 8 pounds  Labs today are TSh, CBC, hBA1ac, chem 7   Fasting lipid and  TSH  In 6 months  You are referred for a lung function test, also symbicort is prescribed for daily use to reduce the need for albuterol, and better control your asthma   Z pack is prescribed for right maxillary sinsuitis and pharyngitis  Pleas check alternate pharamacies for the flu vaccine, eg [facility], and [facility], get next week  Prednisone short term and claritin for uncontrolled allergies is prewcribe  It is important that you exercise regularly at least 30 minutes 5 times a week. If you develop chest pain, have severe difficulty breathing, or feel very tired, stop exercising immediately and seek medical attention   A healthy diet is rich in fruit, vegetables and whole grains. Poultry fish, nuts and beans are a healthy choice for protein rather then red meat. A low sodium diet and drinking 64 ounces of water daily is generally recommended. Oils and sweet should be limited. Carbohydrates especially for those who are diabetic or overweight, should be limited to 30-45 gram per meal. It is important to eat on a regular schedule, at least 3 times daily. Snacks should be primarily fruits, vegetables or nuts.  Weight loss goal of 6 to 8 pounds

## 2012-02-27 NOTE — Assessment & Plan Note (Signed)
H/o wheezing and chest tightness with excessive use of albuterol in recent times.Will add symbicort and refer for PFT

## 2012-02-27 NOTE — Assessment & Plan Note (Signed)
Acute sore throat , swab in office negative for strep throat

## 2012-02-27 NOTE — Assessment & Plan Note (Signed)
Updated lab today 

## 2012-02-27 NOTE — Assessment & Plan Note (Signed)
z pack prescribed and prednisone dose pack for allergies

## 2012-02-27 NOTE — Progress Notes (Signed)
  Subjective:    Patient ID: Gail Cox, female    DOB: 05-17-1965, 47 y.o.   MRN: [id number]  HPI 1 week h/o excessive sneezing , wheezing with use of albuterol at least 3 times weekly for the past 3 to 4 weeks.c/o right facial pressure, sore throat and tender neck gland on right side. Intermittent chills , no documented fever, no ear pain or productive cough. No real investment in lifestyle change to improve health and promote weight loss as of yet, "waiting on the weather to improve"   Review of Systems See HPI Denies recent fever or chills.  Denies chest congestion, productive cough or wheezing. Denies chest pains, palpitations and leg swelling Denies abdominal pain, nausea, vomiting,diarrhea or constipation.   Denies dysuria, frequency, hesitancy or incontinence. Denies joint pain, swelling and limitation in mobility. Denies headaches, seizures, numbness, or tingling. Denies depression, anxiety or insomnia. Denies skin break down or rash.        Objective:   Physical Exam  Patient alert and oriented and in no cardiopulmonary distress.  HEENT: No facial asymmetry, EOMI, right maxillary  sinus tenderness,  oropharynx erythematous, no exudate and moist.  Neck supple right anterior cervical  adenopathy.  Chest: Clear to auscultation bilaterally.  CVS: S1, S2 no murmurs, no S3.  ABD: Soft non tender. Bowel sounds normal.  Ext: No edema  MS: Adequate ROM spine, shoulders, hips and knees.  Skin: Intact, no ulcerations or rash noted.  Psych: Good eye contact, normal affect. Memory intact not anxious or depressed appearing.  CNS: CN 2-12 intact, power, tone and sensation normal throughout.       Assessment & Plan:

## 2012-02-28 ENCOUNTER — Telehealth: Payer: Self-pay | Admitting: Family Medicine

## 2012-02-29 NOTE — Telephone Encounter (Signed)
Left message for pt

## 2012-03-06 ENCOUNTER — Ambulatory Visit (HOSPITAL_COMMUNITY)
Admission: RE | Admit: 2012-03-06 | Discharge: 2012-03-06 | Disposition: A | Discharge status: Home / Self Care | Payer: BC Managed Care – PPO | Source: Ambulatory Visit | Attending: Family Medicine | Admitting: Family Medicine

## 2012-03-06 ENCOUNTER — Ambulatory Visit
Admission: RE | Admit: 2012-03-06 | Discharge: 2012-03-06 | Disposition: A | Discharge status: Home / Self Care | Payer: BC Managed Care – PPO | Source: Ambulatory Visit | Attending: Family Medicine | Admitting: Family Medicine

## 2012-03-06 DIAGNOSIS — Z1231 Encounter for screening mammogram for malignant neoplasm of breast: Secondary | ICD-10-CM

## 2012-03-06 DIAGNOSIS — R0602 Shortness of breath: Secondary | ICD-10-CM | POA: Insufficient documentation

## 2012-03-06 DIAGNOSIS — J45909 Unspecified asthma, uncomplicated: Secondary | ICD-10-CM

## 2012-03-20 ENCOUNTER — Other Ambulatory Visit: Payer: Self-pay | Admitting: Family Medicine

## 2012-03-22 NOTE — Procedures (Signed)
[name], Gail Cox               ACCOUNT NO.:  [id number]  MEDICAL RECORD NO.:  [id number]  LOCATION:  RESP                          FACILITY:  [facility]  PHYSICIAN:  Cierah Crader L. Juanetta Gosling, M.D.DATE OF BIRTH:  [birth year]  DATE OF PROCEDURE: DATE OF DISCHARGE:  03/06/2012                           PULMONARY FUNCTION TEST   Reason for pulmonary function testing is asthma.  1. Spirometry is normal. 2. Lung volumes are normal. 3. DLCO is mildly reduced and improved when ventilation is accounted     for. 4. Airway resistance is normal. 5. This study does not show definitive airflow obstruction and may be     consistent with asthma, but is not diagnostic.     Jalilah Wiltsie L. Juanetta Gosling, M.D.     [facility]/MEDQ  D:  03/21/2012  T:  03/22/2012  Job:  [id number]

## 2012-03-28 LAB — PULMONARY FUNCTION TEST

## 2012-08-21 ENCOUNTER — Other Ambulatory Visit: Payer: Self-pay

## 2012-08-21 MED ORDER — SYNTHROID 125 MCG PO TABS: ORAL_TABLET | ORAL | Status: DC

## 2012-08-27 ENCOUNTER — Telehealth: Payer: Self-pay | Admitting: Family Medicine

## 2012-08-27 NOTE — Telephone Encounter (Signed)
Needs fasying lipid, dx is dysl;ipidemia, TSH, chem 7 and HBA1C  (IGT) fasting pls let her known and fax,,,FASTING

## 2012-08-27 NOTE — Telephone Encounter (Signed)
Would you like to check anything other than tsh?

## 2012-08-27 NOTE — Telephone Encounter (Signed)
See note re labs

## 2012-08-28 ENCOUNTER — Telehealth: Payer: Self-pay

## 2012-08-28 DIAGNOSIS — E039 Hypothyroidism, unspecified: Secondary | ICD-10-CM

## 2012-08-28 DIAGNOSIS — E785 Hyperlipidemia, unspecified: Secondary | ICD-10-CM

## 2012-08-28 DIAGNOSIS — R5383 Other fatigue: Secondary | ICD-10-CM

## 2012-08-28 DIAGNOSIS — R5381 Other malaise: Secondary | ICD-10-CM

## 2012-08-28 DIAGNOSIS — R7301 Impaired fasting glucose: Secondary | ICD-10-CM

## 2012-08-28 LAB — BASIC METABOLIC PANEL
BUN: 13 mg/dL (ref 6–23)
CO2: 26 mEq/L (ref 19–32)
Chloride: 105 mEq/L (ref 96–112)
Creat: 0.73 mg/dL (ref 0.50–1.10)
Glucose, Bld: 87 mg/dL (ref 70–99)

## 2012-08-28 LAB — LIPID PANEL
HDL: 63 mg/dL (ref >39)
Triglycerides: 76 mg/dL (ref <150)

## 2012-08-28 NOTE — Telephone Encounter (Signed)
Labs ordered and faxed.

## 2012-08-28 NOTE — Telephone Encounter (Signed)
labwork ordered and gave to patient

## 2012-09-05 ENCOUNTER — Ambulatory Visit: Payer: BC Managed Care – PPO | Admitting: Family Medicine

## 2012-09-20 ENCOUNTER — Ambulatory Visit (INDEPENDENT_AMBULATORY_CARE_PROVIDER_SITE_OTHER): Payer: BC Managed Care – PPO | Admitting: Otolaryngology

## 2012-09-20 ENCOUNTER — Encounter: Payer: Self-pay | Admitting: Family Medicine

## 2012-09-20 ENCOUNTER — Ambulatory Visit (INDEPENDENT_AMBULATORY_CARE_PROVIDER_SITE_OTHER): Payer: BC Managed Care – PPO | Admitting: Family Medicine

## 2012-09-20 VITALS — BP 142/90 | HR 92 | Resp 16 | Ht 65.0 in | Wt 175.0 lb

## 2012-09-20 DIAGNOSIS — E785 Hyperlipidemia, unspecified: Secondary | ICD-10-CM

## 2012-09-20 DIAGNOSIS — R030 Elevated blood-pressure reading, without diagnosis of hypertension: Secondary | ICD-10-CM

## 2012-09-20 DIAGNOSIS — H9319 Tinnitus, unspecified ear: Secondary | ICD-10-CM

## 2012-09-20 DIAGNOSIS — J343 Hypertrophy of nasal turbinates: Secondary | ICD-10-CM

## 2012-09-20 DIAGNOSIS — H9209 Otalgia, unspecified ear: Secondary | ICD-10-CM

## 2012-09-20 DIAGNOSIS — G43909 Migraine, unspecified, not intractable, without status migrainosus: Secondary | ICD-10-CM

## 2012-09-20 DIAGNOSIS — H903 Sensorineural hearing loss, bilateral: Secondary | ICD-10-CM

## 2012-09-20 DIAGNOSIS — E039 Hypothyroidism, unspecified: Secondary | ICD-10-CM

## 2012-09-20 DIAGNOSIS — M1710 Unilateral primary osteoarthritis, unspecified knee: Secondary | ICD-10-CM

## 2012-09-20 DIAGNOSIS — I10 Essential (primary) hypertension: Secondary | ICD-10-CM | POA: Insufficient documentation

## 2012-09-20 DIAGNOSIS — M170 Bilateral primary osteoarthritis of knee: Secondary | ICD-10-CM

## 2012-09-20 DIAGNOSIS — H9201 Otalgia, right ear: Secondary | ICD-10-CM

## 2012-09-20 DIAGNOSIS — IMO0002 Reserved for concepts with insufficient information to code with codable children: Secondary | ICD-10-CM

## 2012-09-20 DIAGNOSIS — H9311 Tinnitus, right ear: Secondary | ICD-10-CM

## 2012-09-20 MED ORDER — IBUPROFEN 800 MG PO TABS: ORAL_TABLET | ORAL | Status: DC

## 2012-09-20 MED ORDER — AMLODIPINE BESYLATE 2.5 MG PO TABS: 2.5000 mg | ORAL_TABLET | Freq: Every day | ORAL | Status: DC

## 2012-09-20 MED ORDER — KETOROLAC TROMETHAMINE 60 MG/2ML IM SOLN
60.0000 mg | Freq: Once | INTRAMUSCULAR | Status: AC
  Administered 2012-09-20: 60 mg via INTRAMUSCULAR

## 2012-09-20 MED ORDER — PREDNISONE 5 MG PO TABS: 5.0000 mg | ORAL_TABLET | Freq: Two times a day (BID) | ORAL | Status: AC

## 2012-09-20 MED ORDER — METHYLPREDNISOLONE ACETATE 80 MG/ML IJ SUSP
80.0000 mg | Freq: Once | INTRAMUSCULAR | Status: AC
  Administered 2012-09-20: 80 mg via INTRAMUSCULAR

## 2012-09-20 NOTE — Progress Notes (Signed)
  Subjective:    Patient ID: Gail Cox, female    DOB: 27-May-1965, 47 y.o.   MRN: [id number]  HPI The PT is here for follow up and re-evaluation of chronic medical conditions, medication management and review of any available recent lab and radiology data.  Preventive health is updated, specifically  Cancer screening and Immunization.   Questions or concerns regarding consultations or procedures which the PT has had in the interim are  Addressed.Saw3 orthopedics a few months ago due to arthritis in the knees, symptomatic today and requests injections and meds The PT denies any adverse reactions to current medications since the last visit.  C/o right ear pain with buzzing  In the ear for months, no hearing loss or drainage States migraines are improved and controlled Still no regular exercise, diet low in veg but no apparent excessive caloric intake     Review of Systems See HPI Denies recent fever or chills. Denies sinus pressure, nasal congestion,  or sore throat. Denies chest congestion, productive cough or wheezing. Denies chest pains, palpitations and leg swelling Denies abdominal pain, nausea, vomiting,diarrhea or constipation.   Denies dysuria, frequency, hesitancy or incontinence.  Denies headaches, seizures, numbness, or tingling. Denies  anxiety or [employer] depression due to deprived childhood, never acknowledged by her father, has no siblings that she is close to,is her mother's only child, and in a relationship with a gentleman who she loves but who will not commit to marriage Denies skin break down or rash.        Objective:   Physical Exam  Patient alert and oriented and in no cardiopulmonary distress.  HEENT: No facial asymmetry, EOMI, no sinus tenderness,  oropharynx pink and moist.  Neck supple no adenopathy.TM clear bilaterally  Chest: Clear to auscultation bilaterally.  CVS: S1, S2 no murmurs, no S3.  ABD: Soft non tender. Bowel sounds  normal.  Ext: No edema  MS: Adequate ROM spine, shoulders, hips and  Knees.Tender over medial aspect of both knees  Skin: Intact, no ulcerations or rash noted.  Psych: Good eye contact, normal affect. Memory intact not anxious or depressed appearing.  CNS: CN 2-12 intact, power, tone and sensation normal throughout.       Assessment & Plan:

## 2012-09-20 NOTE — Patient Instructions (Addendum)
F/u in  Mid November  Toradol and depo medrol in office for arthritis knees, followed by ibuprofen and prednisone for 5 days only  It is important that you exercise regularly at least 30 minutes 5 times a week. If you develop chest pain, have severe difficulty breathing, or feel very tired, stop exercising immediately and seek medical attention   A healthy diet is rich in fruit, vegetables and whole grains. Poultry fish, nuts and beans are a healthy choice for protein rather then red meat. A low sodium diet and drinking 64 ounces of water daily is generally recommended. Oils and sweet should be limited. Carbohydrates especially for those who are diabetic or overweight, should be limited to 34-45 gram per meal. It is important to eat on a regular schedule, at least 3 times daily. Snacks should be primarily fruits, vegetables or nuts.   You are referred to  ENT  For right ear pain  Please commit to daily physical activity and also try to eat vegetables, like a salad , every day  Continue current thyroid medicine, lab is good     BLOOD pressure is high today, 2nd time, 140/90 or more is high blood pressure on 3 occasions.  Commitment to regular exercise, weight loss and increased vegetable intake with low salt may be able to prevent you from being hypertensive, I ENCOURAGE you to work on this

## 2012-09-22 NOTE — Assessment & Plan Note (Signed)
DASH diet and commitment to daily physical activity for a minimum of 30 minutes discussed and encouraged, as a part of hypertension management. The importance of attaining a healthy weight is also discussed.  

## 2012-09-22 NOTE — Assessment & Plan Note (Signed)
Toradol and depo medrol in office followed by short course of anti inflammatories by mouth Weight loss encouraged as a benefit, also pt to start using her stationary bike

## 2012-09-22 NOTE — Assessment & Plan Note (Signed)
Improved control on current regime with no recent headache in the past over 2 months

## 2012-09-22 NOTE — Assessment & Plan Note (Signed)
Improved pt applauded on this , needs to lower LDL furhter Hyperlipidemia:Low fat diet discussed and encouraged.

## 2012-09-22 NOTE — Assessment & Plan Note (Signed)
Chronic complaint , exam in office non revealing as far as I can see, ENT eval

## 2012-09-22 NOTE — Assessment & Plan Note (Signed)
Controlled, no change in medication  

## 2012-10-18 ENCOUNTER — Ambulatory Visit (INDEPENDENT_AMBULATORY_CARE_PROVIDER_SITE_OTHER): Payer: BC Managed Care – PPO | Admitting: Family Medicine

## 2012-10-18 ENCOUNTER — Encounter: Payer: Self-pay | Admitting: Family Medicine

## 2012-10-18 VITALS — BP 140/94 | HR 80 | Resp 18 | Ht 65.0 in | Wt 175.1 lb

## 2012-10-18 DIAGNOSIS — J209 Acute bronchitis, unspecified: Secondary | ICD-10-CM | POA: Insufficient documentation

## 2012-10-18 DIAGNOSIS — J302 Other seasonal allergic rhinitis: Secondary | ICD-10-CM

## 2012-10-18 DIAGNOSIS — J301 Allergic rhinitis due to pollen: Secondary | ICD-10-CM

## 2012-10-18 DIAGNOSIS — R030 Elevated blood-pressure reading, without diagnosis of hypertension: Secondary | ICD-10-CM

## 2012-10-18 DIAGNOSIS — E039 Hypothyroidism, unspecified: Secondary | ICD-10-CM

## 2012-10-18 DIAGNOSIS — J019 Acute sinusitis, unspecified: Secondary | ICD-10-CM

## 2012-10-18 DIAGNOSIS — J309 Allergic rhinitis, unspecified: Secondary | ICD-10-CM

## 2012-10-18 MED ORDER — AZITHROMYCIN 250 MG PO TABS: ORAL_TABLET | ORAL | Status: AC

## 2012-10-18 MED ORDER — METHYLPREDNISOLONE ACETATE 80 MG/ML IJ SUSP
80.0000 mg | Freq: Once | INTRAMUSCULAR | Status: AC
  Administered 2012-10-18: 80 mg via INTRAMUSCULAR

## 2012-10-18 MED ORDER — BENZONATATE 100 MG PO CAPS: 100.0000 mg | ORAL_CAPSULE | Freq: Three times a day (TID) | ORAL | Status: DC | PRN

## 2012-10-18 MED ORDER — PREDNISONE 5 MG PO TABS: 5.0000 mg | ORAL_TABLET | Freq: Two times a day (BID) | ORAL | Status: AC

## 2012-10-18 NOTE — Patient Instructions (Addendum)
F/u as before  You are  Being treated for uncontrolled allergies depo medrol 80mg  Im in the office today, Also for acute bronchitis and pharyngitis and sinusitis, prednisone for 5 days, z pack and tessalon perles

## 2012-10-20 NOTE — Assessment & Plan Note (Signed)
z pack prescribed 

## 2012-10-20 NOTE — Assessment & Plan Note (Signed)
deecongestant and antibiotic prescribed

## 2012-10-20 NOTE — Assessment & Plan Note (Signed)
Controlled, no change in medication  

## 2012-10-20 NOTE — Assessment & Plan Note (Signed)
Uncontrolled with flare, depo medrol in office

## 2012-10-20 NOTE — Progress Notes (Signed)
  Subjective:    Patient ID: Gail Cox, female    DOB: 11-27-1965, 47 y.o.   MRN: [id number]  HPI 2 day h/o sore throat with head congestion, pressure over forehead, yellow  Green post nasal drainage, sore throat and chills. Also cough with occasional sputum.    Review of Systems See HPI Denies chest pains, palpitations and leg swelling Denies abdominal pain, nausea, vomiting,diarrhea or constipation.   Denies dysuria, frequency, hesitancy or incontinence. Denies joint pain, swelling and limitation in mobility. Denies headaches, seizures, numbness, or tingling. Denies depression, anxiety or insomnia. Denies skin break down or rash.        Objective:   Physical Exam  Patient alert and oriented and in no cardiopulmonary distress.  HEENT: No facial asymmetry, EOMI, frontal sinus tenderness,  oropharynx mildly erythematous and moist.  Neck supple anterior cervical adenopathy.TM clear  Chest: Adequate air entry, few crackles, no wheezes  CVS: S1, S2 no murmurs, no S3.  ABD: Soft non tender. Bowel sounds normal.  Ext: No edema  MS: Adequate ROM spine, shoulders, hips and knees.  Skin: Intact, no ulcerations or rash noted.  Psych: Good eye contact, normal affect. Memory intact not anxious or depressed appearing.  CNS: CN 2-12 intact, power, tone and sensation normal throughout.       Assessment & Plan:

## 2012-10-20 NOTE — Assessment & Plan Note (Signed)
Pt has stage 1 HTN, still continues to get normal values outside office, will continue lifestyle , has upcoming f/u, I believe will benefit from med

## 2012-11-06 ENCOUNTER — Ambulatory Visit (INDEPENDENT_AMBULATORY_CARE_PROVIDER_SITE_OTHER): Payer: BC Managed Care – PPO

## 2012-11-06 DIAGNOSIS — Z23 Encounter for immunization: Secondary | ICD-10-CM

## 2012-12-03 ENCOUNTER — Ambulatory Visit: Payer: BC Managed Care – PPO

## 2012-12-17 ENCOUNTER — Telehealth: Payer: Self-pay

## 2012-12-17 ENCOUNTER — Other Ambulatory Visit: Payer: Self-pay | Admitting: Family Medicine

## 2012-12-17 DIAGNOSIS — M79673 Pain in unspecified foot: Secondary | ICD-10-CM

## 2012-12-17 NOTE — Telephone Encounter (Signed)
pls see  if you are able to get an appt per pt request to podiatry for heel spur

## 2013-01-26 ENCOUNTER — Other Ambulatory Visit: Payer: Self-pay | Admitting: Family Medicine

## 2013-01-29 ENCOUNTER — Telehealth: Payer: Self-pay | Admitting: Family Medicine

## 2013-01-29 NOTE — Telephone Encounter (Signed)
Med refilled 12/27

## 2013-02-08 ENCOUNTER — Other Ambulatory Visit: Payer: Self-pay

## 2013-02-08 DIAGNOSIS — Z1231 Encounter for screening mammogram for malignant neoplasm of breast: Secondary | ICD-10-CM

## 2013-03-08 ENCOUNTER — Ambulatory Visit: Payer: BC Managed Care – PPO

## 2013-03-18 ENCOUNTER — Ambulatory Visit: Payer: BC Managed Care – PPO

## 2013-04-04 ENCOUNTER — Ambulatory Visit: Payer: BC Managed Care – PPO

## 2013-04-11 ENCOUNTER — Ambulatory Visit
Admission: RE | Admit: 2013-04-11 | Discharge: 2013-04-11 | Disposition: A | Discharge status: Home / Self Care | Payer: BC Managed Care – PPO | Source: Ambulatory Visit

## 2013-04-11 DIAGNOSIS — Z1231 Encounter for screening mammogram for malignant neoplasm of breast: Secondary | ICD-10-CM

## 2013-05-22 ENCOUNTER — Other Ambulatory Visit: Payer: Self-pay | Admitting: Family Medicine

## 2013-05-22 ENCOUNTER — Telehealth: Payer: Self-pay

## 2013-05-22 DIAGNOSIS — M170 Bilateral primary osteoarthritis of knee: Secondary | ICD-10-CM

## 2013-05-22 MED ORDER — IBUPROFEN 800 MG PO TABS: ORAL_TABLET | ORAL | Status: DC

## 2013-05-22 NOTE — Telephone Encounter (Signed)
Ok to refill x 1  

## 2013-05-22 NOTE — Telephone Encounter (Signed)
Med refilled and patient aware. 

## 2013-05-22 NOTE — Telephone Encounter (Signed)
Requesting refill, no longer on active list. Ok to refill?

## 2013-05-28 ENCOUNTER — Ambulatory Visit: Payer: BC Managed Care – PPO | Admitting: Family Medicine

## 2013-05-29 ENCOUNTER — Telehealth: Payer: Self-pay

## 2013-05-29 DIAGNOSIS — R5381 Other malaise: Secondary | ICD-10-CM

## 2013-05-29 DIAGNOSIS — R5383 Other fatigue: Secondary | ICD-10-CM

## 2013-05-29 DIAGNOSIS — E039 Hypothyroidism, unspecified: Secondary | ICD-10-CM

## 2013-05-29 LAB — CBC
HCT: 39.5 % (ref 36.0–46.0)
Hemoglobin: 13.4 g/dL (ref 12.0–15.0)
MCH: 29.1 pg (ref 26.0–34.0)
MCHC: 33.9 g/dL (ref 30.0–36.0)
MCV: 85.7 fL (ref 78.0–100.0)
Platelets: 313 10*3/uL (ref 150–400)
RBC: 4.61 MIL/uL (ref 3.87–5.11)
RDW: 13.8 % (ref 11.5–15.5)
WBC: 4.2 10*3/uL (ref 4.0–10.5)

## 2013-05-29 LAB — TSH: TSH: 3.279 u[IU]/mL (ref 0.350–4.500)

## 2013-05-29 NOTE — Telephone Encounter (Signed)
Labs ordered per verbal order given

## 2013-06-04 ENCOUNTER — Ambulatory Visit: Payer: BC Managed Care – PPO | Admitting: Family Medicine

## 2013-06-28 ENCOUNTER — Other Ambulatory Visit: Payer: Self-pay | Admitting: Family Medicine

## 2013-06-28 ENCOUNTER — Other Ambulatory Visit: Payer: Self-pay

## 2013-06-28 MED ORDER — SYNTHROID 125 MCG PO TABS: ORAL_TABLET | ORAL | Status: DC

## 2013-06-28 MED ORDER — IBUPROFEN 800 MG PO TABS: ORAL_TABLET | ORAL | Status: DC

## 2013-07-01 ENCOUNTER — Encounter (INDEPENDENT_AMBULATORY_CARE_PROVIDER_SITE_OTHER): Payer: Self-pay

## 2013-07-01 ENCOUNTER — Encounter: Payer: Self-pay | Admitting: Family Medicine

## 2013-07-01 ENCOUNTER — Ambulatory Visit (INDEPENDENT_AMBULATORY_CARE_PROVIDER_SITE_OTHER): Payer: BC Managed Care – PPO | Admitting: Family Medicine

## 2013-07-01 VITALS — BP 160/98 | HR 72 | Resp 18 | Ht 65.0 in | Wt 170.1 lb

## 2013-07-01 DIAGNOSIS — M25569 Pain in unspecified knee: Secondary | ICD-10-CM

## 2013-07-01 DIAGNOSIS — E785 Hyperlipidemia, unspecified: Secondary | ICD-10-CM

## 2013-07-01 DIAGNOSIS — M170 Bilateral primary osteoarthritis of knee: Secondary | ICD-10-CM

## 2013-07-01 DIAGNOSIS — R7301 Impaired fasting glucose: Secondary | ICD-10-CM

## 2013-07-01 DIAGNOSIS — IMO0002 Reserved for concepts with insufficient information to code with codable children: Secondary | ICD-10-CM

## 2013-07-01 DIAGNOSIS — M25562 Pain in left knee: Secondary | ICD-10-CM

## 2013-07-01 DIAGNOSIS — M1710 Unilateral primary osteoarthritis, unspecified knee: Secondary | ICD-10-CM

## 2013-07-01 DIAGNOSIS — E039 Hypothyroidism, unspecified: Secondary | ICD-10-CM

## 2013-07-01 DIAGNOSIS — G43909 Migraine, unspecified, not intractable, without status migrainosus: Secondary | ICD-10-CM

## 2013-07-01 DIAGNOSIS — I10 Essential (primary) hypertension: Secondary | ICD-10-CM | POA: Insufficient documentation

## 2013-07-01 MED ORDER — KETOROLAC TROMETHAMINE 60 MG/2ML IM SOLN
60.0000 mg | Freq: Once | INTRAMUSCULAR | Status: AC
  Administered 2013-07-01: 60 mg via INTRAMUSCULAR

## 2013-07-01 MED ORDER — METHYLPREDNISOLONE ACETATE 80 MG/ML IJ SUSP
80.0000 mg | Freq: Once | INTRAMUSCULAR | Status: AC
  Administered 2013-07-01: 80 mg via INTRAMUSCULAR

## 2013-07-01 MED ORDER — PREDNISONE 5 MG PO TABS: 5.0000 mg | ORAL_TABLET | Freq: Two times a day (BID) | ORAL | Status: DC

## 2013-07-01 MED ORDER — IBUPROFEN 800 MG PO TABS: 800.0000 mg | ORAL_TABLET | Freq: Three times a day (TID) | ORAL | Status: DC

## 2013-07-01 MED ORDER — AMLODIPINE BESYLATE 5 MG PO TABS: 5.0000 mg | ORAL_TABLET | Freq: Every day | ORAL | Status: DC

## 2013-07-01 NOTE — Patient Instructions (Signed)
F/u in 6 weeks, call if you need me before  New for high blood pressure is amlodipine , important to take this every day at the same time, I recommend you take it at bedtime  Toradol and depomedrol in office today for knee pain and short course of medication also prescribed  HBA1c and chem 7 and EGFR today  Hypertension Hypertension is another name for high blood pressure. High blood pressure may mean that your heart needs to work harder to pump blood. Blood pressure consists of two numbers, which includes a higher number over a lower number (example: 110/72). HOME CARE   Make lifestyle changes as told by your doctor. This may include weight loss and exercise.  Take your blood pressure medicine every day.  Limit how much salt you use.  Stop smoking if you smoke.  Do not use drugs.  Talk to your doctor if you are using decongestants or birth control pills. These medicines might make blood pressure higher.  Females should not drink more than 1 alcoholic drink per day. Males should not drink more than 2 alcoholic drinks per day.  See your doctor as told. GET HELP RIGHT AWAY IF:   You have a blood pressure reading with a top number of 180 or higher.  You get a very bad headache.  You get blurred or changing vision.  You feel confused.  You feel weak, numb, or faint.  You get chest or belly (abdominal) pain.  You throw up (vomit).  You cannot breathe very well. MAKE SURE YOU:   Understand these instructions.  Will watch your condition.  Will get help right away if you are not doing well or get worse. Document Released: 07/06/2007 Document Revised: 04/11/2011 Document Reviewed: 07/06/2007 [facility] Patient Information 2014 [place], [place].

## 2013-07-02 LAB — HEMOGLOBIN A1C
Hgb A1c MFr Bld: 5.6 % (ref <5.7)
Mean Plasma Glucose: 114 mg/dL (ref <117)

## 2013-07-02 LAB — BASIC METABOLIC PANEL WITH GFR
BUN: 9 mg/dL (ref 6–23)
CO2: 26 meq/L (ref 19–32)
CREATININE: 0.71 mg/dL (ref 0.50–1.10)
Calcium: 9.2 mg/dL (ref 8.4–10.5)
Chloride: 105 mEq/L (ref 96–112)
GFR, Est African American: >89 mL/min
GFR, Est Non African American: >89 mL/min
GLUCOSE: 93 mg/dL (ref 70–99)
Potassium: 4.2 mEq/L (ref 3.5–5.3)
Sodium: 138 mEq/L (ref 135–145)

## 2013-07-08 NOTE — Progress Notes (Signed)
   Subjective:    Patient ID: Gail Cox, female    DOB: 1965-08-09, 48 y.o.   MRN: [id number]  HPI The PT is here for follow up and re-evaluation of chronic medical conditions, medication management and review of any available recent lab and radiology data.  Preventive health is updated, specifically  Cancer screening and Immunization.   Questions or concerns regarding consultations or procedures which the PT has had in the interim are  addressed. The PT denies any adverse reactions to current medications since the last visit.  Increased and uncontrolled knee pain ,left more than right, has benefited in past from injections and requests same Finally believes dx of HTN and is willing to take medication    Review of Systems See HPI Denies recent fever or chills. Denies sinus pressure, nasal congestion, ear pain or sore throat. Denies chest congestion, productive cough or wheezing. Denies chest pains, palpitations and leg swelling Denies abdominal pain, nausea, vomiting,diarrhea or constipation.   Denies dysuria, frequency, hesitancy or incontinence.  Denies headaches, seizures, numbness, or tingling. Denies depression, anxiety or insomnia. Denies skin break down or rash.        Objective:   Physical Exam  BP 160/98  Pulse 72  Resp 18  Ht 5\' 5"  (1.651 m)  Wt 170 lb 1.9 oz (77.166 kg)  BMI 28.31 kg/m2  SpO2 99% Patient alert and oriented and in no cardiopulmonary distress.  HEENT: No facial asymmetry, EOMI,   oropharynx pink and moist.  Neck supple no JVD, no mass.  Chest: Clear to auscultation bilaterally.  CVS: S1, S2 no murmurs, no S3.  ABD: Soft non tender.   Ext: No edema  MS: Adequate ROM spine, shoulders, hips and reduced in both  Knees, left greater than right with crepitus in left knee  Skin: Intact, no ulcerations or rash noted.  Psych: Good eye contact, normal affect. Memory intact not anxious or depressed appearing.  CNS: CN 2-12 intact, power,   normal throughout.no focal deficits noted.       Assessment & Plan:  Unspecified essential hypertension Uncontrolled, pt now finally agreees on the nee for medication, she even brought back her home BP meter which shows elevated BP's at home DASH diet and commitment to daily physical activity for a minimum of 30 minutes discussed and encouraged, as a part of hypertension management. The importance of attaining a healthy weight is also discussed.   HYPOTHYROIDISM Controlled, no change in medication   Dyslipidemia Updated lab needed at/ before next visit. Hyperlipidemia:Low fat diet discussed and encouraged.    Osteoarthritis of both knees Acute flare of pain in knee injections in opffice followed by short course of oral meds  MIGRAINE HEADACHE Improved control with daily topamax, will continue same

## 2013-07-08 NOTE — Assessment & Plan Note (Signed)
Improved control with daily topamax, will continue same

## 2013-07-08 NOTE — Assessment & Plan Note (Signed)
Controlled, no change in medication  

## 2013-07-08 NOTE — Assessment & Plan Note (Signed)
Updated lab needed at/ before next visit. Hyperlipidemia:Low fat diet discussed and encouraged.   

## 2013-07-08 NOTE — Assessment & Plan Note (Signed)
Acute flare of pain in knee injections in opffice followed by short course of oral meds

## 2013-07-08 NOTE — Assessment & Plan Note (Signed)
Uncontrolled, pt now finally agreees on the nee for medication, she even brought back her home BP meter which shows elevated BP's at home DASH diet and commitment to daily physical activity for a minimum of 30 minutes discussed and encouraged, as a part of hypertension management. The importance of attaining a healthy weight is also discussed.

## 2013-07-29 ENCOUNTER — Telehealth: Payer: Self-pay | Admitting: Family Medicine

## 2013-07-31 NOTE — Telephone Encounter (Signed)
Noted  

## 2013-08-08 ENCOUNTER — Other Ambulatory Visit: Payer: Self-pay | Admitting: Family Medicine

## 2013-09-10 ENCOUNTER — Telehealth: Payer: Self-pay

## 2013-09-10 NOTE — Telephone Encounter (Signed)
Patient advised to use very sparingly and only if have severe pain. Advised it can increase risk of heart disease

## 2013-09-16 ENCOUNTER — Ambulatory Visit (INDEPENDENT_AMBULATORY_CARE_PROVIDER_SITE_OTHER): Payer: BC Managed Care – PPO | Admitting: Obstetrics and Gynecology

## 2013-09-16 ENCOUNTER — Encounter: Payer: Self-pay | Admitting: Obstetrics and Gynecology

## 2013-09-16 VITALS — BP 120/84 | Ht 65.0 in | Wt 174.0 lb

## 2013-09-16 DIAGNOSIS — Z01419 Encounter for gynecological examination (general) (routine) without abnormal findings: Secondary | ICD-10-CM

## 2013-09-16 DIAGNOSIS — N898 Other specified noninflammatory disorders of vagina: Secondary | ICD-10-CM

## 2013-09-16 LAB — POCT WET PREP WITH KOH
BACTERIA WET PREP HPF POC: NORMAL
Epithelial Wet Prep HPF POC: POSITIVE
KOH PREP POC: NEGATIVE
RBC WET PREP PER HPF POC: NORMAL
Trichomonas, UA: NEGATIVE
WBC Wet Prep HPF POC: NORMAL
Yeast Wet Prep HPF POC: NEGATIVE

## 2013-09-16 NOTE — Progress Notes (Signed)
This chart was scribed by Donato Schultz, Medical Scribe, for Dr. Mallory Shirk on 09/16/2013 at 2:38 PM. This chart was reviewed by Dr. Mallory Shirk for accuracy.   Assessment:  Annual Gyn Exam   Plan:  1. pap smear done, next pap due 3 yrs 2. return annually or prn 3    Annual mammogram advised 4.   Follow-up in 5 yrs Subjective:  Gail Cox is a 48 y.o. female No obstetric history on file. who presents for annual exam. No LMP recorded. Patient has had a hysterectomy.  She still has one ovary remaining from her hysterectomy.  Dr. Moshe Cipro is still prescribing her thyroid medication.  She has a history of breast biopsy that revealed a benign cyst.   The patient is complaining of slight vaginal itching.  She has not been sexually active.   The following portions of the patient's history were reviewed and updated as appropriate: allergies, current medications, past family history, past medical history, past social history, past surgical history and problem list. Past Medical History  Diagnosis Date  . Allergic rhinitis, seasonal   . Hypothyroidism   . Migraine headache     Past Surgical History  Procedure Laterality Date  . Cholecystectomy  1999  . Ovarian cyst surgery  2003    Removed and uterus  . Thyroidectomy  2006  . Cystectomy  1977    Right arm  . Cystectomy  2007    Left breast  . Colonoscopy  09/21/2011    Procedure: COLONOSCOPY;  Surgeon: Rogene Houston, MD;  Location: [facility];  Service: Endoscopy;  Laterality: N/A;  830    Current outpatient prescriptions:albuterol (PROVENTIL HFA;VENTOLIN HFA) 108 (90 BASE) MCG/ACT inhaler, Inhale 2 puffs into the lungs every 6 (six) hours as needed. For shortness of breath, Disp: , Rfl: ;  Aspirin-Acetaminophen-Caffeine (MIGRAINE RELIEF PO), Take 1 tablet by mouth as needed. For migraine, Disp: , Rfl: ;  diphenhydramine-acetaminophen (TYLENOL PM) 25-500 MG TABS, Take 1 tablet by mouth at bedtime as needed. For migraine, Disp:  , Rfl:  Doxylamine Succinate, Sleep, (SLEEP AID PO), Take 1-2 tablets by mouth as needed. For sleep, Disp: , Rfl: ;  SYNTHROID 125 MCG tablet, TAKE ONE TABLET BY MOUTH ONCE DAILY, Disp: 30 tablet, Rfl: 3;  ibuprofen (ADVIL,MOTRIN) 800 MG tablet, TAKE ONE TABLET BY MOUTH TWICE DAILY FOR 5 DAYS, THEN  TAKE  AS  NEEDED  FOR  KNEE  PAIN., Disp: 30 tablet, Rfl: 0  Review of Systems Constitutional: negative Gastrointestinal: negative Genitourinary: negative  Objective:  BP 120/84  Ht 5\' 5"  (1.651 m)  Wt 174 lb (78.926 kg)  BMI 28.96 kg/m2   BMI: Body mass index is 28.96 kg/(m^2).  General Appearance: Alert, appropriate appearance for age. No acute distress HEENT: Grossly normal Neck / Thyroid:  Cardiovascular: RRR; normal S1, S2, no murmur Lungs: CTA bilaterally Back: No CVAT Breast Exam: No dimpling, nipple retraction or discharge. No masses or nodes., Normal to inspection, Normal breast tissue bilaterally and No masses or nodes.No dimpling, nipple retraction or discharge. Gastrointestinal: Soft, non-tender, no masses or organomegaly Pelvic Exam:  External genitalia: normal general appearance Vaginal: normal mucosa without prolapse or lesions and normal without tenderness, induration or masses, negative for yeast on KOH  Cervix: absent Adnexa: absent Uterus: absent Rectal: good sphincter tone and no masses Rectovaginal: normal rectal, no masses Lymphatic Exam: Non-palpable nodes in neck, clavicular, axillary, or inguinal regions  Skin: no rash or abnormalities Neurologic: Normal gait and speech,  no tremor  Psychiatric: Alert and oriented, appropriate affect.  Urinalysis:Normal  Mallory Shirk. MD Pgr [phone] 2:34 PM

## 2013-09-19 ENCOUNTER — Other Ambulatory Visit: Payer: Self-pay | Admitting: Obstetrics and Gynecology

## 2013-09-27 ENCOUNTER — Telehealth: Payer: Self-pay | Admitting: Family Medicine

## 2013-09-28 NOTE — Telephone Encounter (Signed)
Noted  

## 2013-09-28 NOTE — Telephone Encounter (Signed)
noted 

## 2013-10-25 ENCOUNTER — Other Ambulatory Visit: Payer: Self-pay | Admitting: Family Medicine

## 2013-11-18 ENCOUNTER — Ambulatory Visit (INDEPENDENT_AMBULATORY_CARE_PROVIDER_SITE_OTHER): Payer: BC Managed Care – PPO | Admitting: Family Medicine

## 2013-11-18 ENCOUNTER — Encounter: Payer: Self-pay | Admitting: Family Medicine

## 2013-11-18 VITALS — BP 140/90 | HR 79 | Resp 16 | Ht 65.0 in | Wt 176.8 lb

## 2013-11-18 DIAGNOSIS — Z1211 Encounter for screening for malignant neoplasm of colon: Secondary | ICD-10-CM

## 2013-11-18 DIAGNOSIS — M170 Bilateral primary osteoarthritis of knee: Secondary | ICD-10-CM

## 2013-11-18 DIAGNOSIS — Z23 Encounter for immunization: Secondary | ICD-10-CM

## 2013-11-18 DIAGNOSIS — I10 Essential (primary) hypertension: Secondary | ICD-10-CM

## 2013-11-18 DIAGNOSIS — F418 Other specified anxiety disorders: Secondary | ICD-10-CM | POA: Insufficient documentation

## 2013-11-18 DIAGNOSIS — E785 Hyperlipidemia, unspecified: Secondary | ICD-10-CM

## 2013-11-18 DIAGNOSIS — M542 Cervicalgia: Secondary | ICD-10-CM

## 2013-11-18 DIAGNOSIS — J302 Other seasonal allergic rhinitis: Secondary | ICD-10-CM

## 2013-11-18 DIAGNOSIS — E038 Other specified hypothyroidism: Secondary | ICD-10-CM

## 2013-11-18 NOTE — Assessment & Plan Note (Signed)
Broken relationship in 07/2013 has worsened her mental health, states at one time felt suicidal, not so currentlt, but still very depressed and "feels alone" Chosing to  Hold off on therapy and medication currently though would benefit from and needs both, f/u in 3 month

## 2013-11-18 NOTE — Assessment & Plan Note (Signed)
Managed with tylenol, ice  And topical med

## 2013-11-18 NOTE — Assessment & Plan Note (Signed)
Controlled, no change in medication Updated lab needed at/ before next visit.  

## 2013-11-18 NOTE — Assessment & Plan Note (Signed)
Recent flare and acute episode of vertigo and emesis in September x 1 day only, now better  control

## 2013-11-18 NOTE — Patient Instructions (Signed)
F/u in 3.5 month, call if you need me before  Fasting lipid and chem 7 and TSh  asap.. You need mammogram and colonoscopy ,. Both are past due , will refer''   Pls re consider therapy, you will benefit am sure  It is important that you exercise regularly at least 30 minutes 5 times a week. If you develop chest pain, have severe difficulty breathing, or feel very tired, stop exercising immediately and seek medical attention   Flu vaccine today

## 2013-11-18 NOTE — Assessment & Plan Note (Signed)
Vaccine administered at visit.  

## 2013-11-18 NOTE — Assessment & Plan Note (Signed)
Improved with conservative management per pt report Still seeing chiropracter does home exercises also

## 2013-11-18 NOTE — Assessment & Plan Note (Signed)
Improved, but still not at goal DASH diet and commitment to daily physical activity for a minimum of 30 minutes discussed and encouraged, as a part of hypertension management. The importance of attaining a healthy weight is also discussed.

## 2013-11-18 NOTE — Progress Notes (Signed)
   Subjective:    Patient ID: Gail Cox, female    DOB: [birth year], 48 y.o.   MRN: [id number]  HPI The PT is here for follow up and re-evaluation of chronic medical conditions, medication management and review of any available recent lab and radiology data.  Preventive health is updated, specifically  Cancer screening and Immunization.  Needs colonioscopy and mammogram, she is aware and referral has been done. Flu vaccine today Questions or concerns regarding consultations or procedures which the PT has had in the interim are  Addressed.Has been seeing chiropracter for  Neck pain radiating to left hand which has numbness, started in 08/2013 at a 10, now a 5 , benefit from treatment The PT denies any adverse reactions to current medications since the last visit.  Increased symptoms of depression since break up of relationship, holding out on treatment      Review of Systems See HPI Denies recent fever or chills. Denies sinus pressure, nasal congestion, ear pain or sore throat. Denies chest congestion, productive cough or wheezing. Denies chest pains, palpitations and leg swelling Denies abdominal pain, nausea, vomiting,diarrhea or constipation.   Denies dysuria, frequency, hesitancy or incontinence. Denies headaches, seizures, numbness, or tingling.  Denies skin break down or rash.        Objective:   Physical Exam BP 140/90  Pulse 79  Resp 16  Ht 5\' 5"  (1.651 m)  Wt 176 lb 12.8 oz (80.196 kg)  BMI 29.42 kg/m2  SpO2 99% Patient alert and oriented and in no cardiopulmonary distress.  HEENT: No facial asymmetry, EOMI,   oropharynx pink and moist.  Neck supple no JVD, no mass.  Chest: Clear to auscultation bilaterally.  CVS: S1, S2 no murmurs, no S3.Regular rate.  ABD: Soft non tender.   Ext: No edema  MS: Adequate ROM spine, shoulders, hips and knees.  Skin: Intact, no ulcerations or rash noted.  Psych: Poor  eye contact, flat  affect. Memory intact not  anxious but  depressed appearing.  CNS: CN 2-12 intact, power,  normal throughout.no focal deficits noted.        Assessment & Plan:  Essential hypertension Improved, but still not at goal DASH diet and commitment to daily physical activity for a minimum of 30 minutes discussed and encouraged, as a part of hypertension management. The importance of attaining a healthy weight is also discussed.   Hypothyroidism Controlled, no change in medication Updated lab needed at/ before next visit.   Neck pain on left side Improved with conservative management per pt report Still seeing chiropracter does home exercises also  Osteoarthritis of both knees Managed with tylenol, ice  And topical med   Seasonal allergies Recent flare and acute episode of vertigo and emesis in September x 1 day only, now better  control  Depression with anxiety Broken relationship in 07/2013 has worsened her mental health, states at one time felt suicidal, not so currentlt, but still very depressed and "feels alone" Chosing to  Hold off on therapy and medication currently though would benefit from and needs both, f/u in 3 month  Need for prophylactic vaccination and inoculation against influenza Vaccine administered at visit.

## 2013-11-19 ENCOUNTER — Encounter (INDEPENDENT_AMBULATORY_CARE_PROVIDER_SITE_OTHER): Payer: Self-pay | Admitting: *Deleted

## 2013-12-27 ENCOUNTER — Other Ambulatory Visit: Payer: Self-pay | Admitting: Family Medicine

## 2014-01-27 ENCOUNTER — Other Ambulatory Visit: Payer: Self-pay | Admitting: Family Medicine

## 2014-01-31 HISTORY — PX: SPINE SURGERY: SHX786

## 2014-02-09 LAB — LIPID PANEL
Cholesterol: 208 mg/dL — ABNORMAL HIGH (ref 0–200)
HDL: 69 mg/dL (ref >39)
LDL Cholesterol: 127 mg/dL — ABNORMAL HIGH (ref 0–99)
TRIGLYCERIDES: 62 mg/dL (ref <150)
Total CHOL/HDL Ratio: 3 Ratio
VLDL: 12 mg/dL (ref 0–40)

## 2014-02-09 LAB — BASIC METABOLIC PANEL
BUN: 10 mg/dL (ref 6–23)
CALCIUM: 9.1 mg/dL (ref 8.4–10.5)
CO2: 25 mEq/L (ref 19–32)
Chloride: 105 mEq/L (ref 96–112)
Creat: 0.75 mg/dL (ref 0.50–1.10)
Glucose, Bld: 87 mg/dL (ref 70–99)
Potassium: 4.2 mEq/L (ref 3.5–5.3)
SODIUM: 140 meq/L (ref 135–145)

## 2014-02-09 LAB — TSH: TSH: 1.975 u[IU]/mL (ref 0.350–4.500)

## 2014-02-10 ENCOUNTER — Ambulatory Visit (INDEPENDENT_AMBULATORY_CARE_PROVIDER_SITE_OTHER): Payer: BLUE CROSS/BLUE SHIELD | Admitting: Family Medicine

## 2014-02-10 ENCOUNTER — Encounter (INDEPENDENT_AMBULATORY_CARE_PROVIDER_SITE_OTHER): Payer: Self-pay

## 2014-02-10 ENCOUNTER — Encounter: Payer: Self-pay | Admitting: Family Medicine

## 2014-02-10 VITALS — BP 132/80 | HR 80 | Resp 18 | Ht 65.0 in | Wt 178.1 lb

## 2014-02-10 DIAGNOSIS — Z23 Encounter for immunization: Secondary | ICD-10-CM | POA: Insufficient documentation

## 2014-02-10 DIAGNOSIS — E669 Obesity, unspecified: Secondary | ICD-10-CM

## 2014-02-10 DIAGNOSIS — E66811 Obesity, class 1: Secondary | ICD-10-CM

## 2014-02-10 DIAGNOSIS — F418 Other specified anxiety disorders: Secondary | ICD-10-CM

## 2014-02-10 DIAGNOSIS — Z139 Encounter for screening, unspecified: Secondary | ICD-10-CM

## 2014-02-10 DIAGNOSIS — R7301 Impaired fasting glucose: Secondary | ICD-10-CM

## 2014-02-10 DIAGNOSIS — G43109 Migraine with aura, not intractable, without status migrainosus: Secondary | ICD-10-CM

## 2014-02-10 DIAGNOSIS — E785 Hyperlipidemia, unspecified: Secondary | ICD-10-CM

## 2014-02-10 DIAGNOSIS — Z13220 Encounter for screening for lipoid disorders: Secondary | ICD-10-CM

## 2014-02-10 DIAGNOSIS — I10 Essential (primary) hypertension: Secondary | ICD-10-CM

## 2014-02-10 DIAGNOSIS — E038 Other specified hypothyroidism: Secondary | ICD-10-CM

## 2014-02-10 NOTE — Progress Notes (Signed)
   Subjective:    Patient ID: Gail Cox, female    DOB: 04-08-1965, 49 y.o.   MRN: [id number]  HPI The PT is here for follow up and re-evaluation of chronic medical conditions, medication management and review of any available recent lab and radiology data.  Preventive health is updated, specifically  Cancer screening and Immunization.   . The PT denies any adverse reactions to current medications since the last visit.  There are no new concerns.  There are no specific complaints  She has started exercise and intends to continue this, she is trying to improve overal health and to lose weight      Review of Systems See HPI Denies recent fever or chills. Denies sinus pressure, nasal congestion, ear pain or sore throat. Denies chest congestion, productive cough or wheezing. Denies chest pains, palpitations and leg swelling Denies abdominal pain, nausea, vomiting,diarrhea or constipation.   Denies dysuria, frequency, hesitancy or incontinence. Denies joint pain, swelling and limitation in mobility. Denies headaches, seizures, numbness, or tingling. Denies depression, anxiety or insomnia. Denies skin break down or rash.        Objective:   Physical Exam  BP 132/80 mmHg  Pulse 80  Resp 18  Ht 5\' 5"  (1.651 m)  Wt 178 lb 1.3 oz (80.777 kg)  BMI 29.63 kg/m2  SpO2 97% Patient alert and oriented and in no cardiopulmonary distress.  HEENT: No facial asymmetry, EOMI,   oropharynx pink and moist.  Neck supple no JVD, no mass.  Chest: Clear to auscultation bilaterally.  CVS: S1, S2 no murmurs, no S3.Regular rate.  ABD: Soft non tender.   Ext: No edema  MS: Adequate ROM spine, shoulders, hips and knees.  Skin: Intact, no ulcerations or rash noted.  Psych: Good eye contact, normal affect. Memory intact not anxious or depressed appearing.  CNS: CN 2-12 intact, power,  normal throughout.no focal deficits noted.       Assessment & Plan:  Essential  hypertension Controlled, no change in medication DASH diet and commitment to daily physical activity for a minimum of 30 minutes discussed and encouraged, as a part of hypertension management. The importance of attaining a healthy weight is also discussed.   Hypothyroidism Controlled, no change in medication Updated lab needed at/ before next visit.   Depression with anxiety Improved, pt on no medication and none indicated currently  Obesity (BMI 30.0-34.9) [place] Patient re-educated about  the importance of commitment to a  minimum of 150 minutes of exercise per week. The importance of healthy food choices with portion control discussed. Encouraged to start a food diary, count calories and to consider  joining a support group. Sample diet sheets offered. Goals set by the patient for the next several months.     Need for vaccination with 13-polyvalent pneumococcal conjugate vaccine Vaccine administered at visit.   Hyperlipidemia LDL goal <100 deteriorated Hyperlipidemia:Low fat diet discussed and encouraged.  Updated lab needed at/ before next visit.   Migraine with aura Less frreequent A and good response to abortive medication, no change in management

## 2014-02-10 NOTE — Patient Instructions (Addendum)
F/u in 5.5 month, call if you need me before  No changes in medication  Prevnar today  Please continue to eat healthily, and reduce portion sizes and also reduce fried foods and battered fish, since cholesterol is a bit high, and the weight is increasing slightly  Fasting lipid, cmp , hBa1C, TSH , and CBC, vit D in 5.5 month

## 2014-02-11 DIAGNOSIS — E785 Hyperlipidemia, unspecified: Secondary | ICD-10-CM | POA: Insufficient documentation

## 2014-02-11 NOTE — Assessment & Plan Note (Signed)
Controlled, no change in medication Updated lab needed at/ before next visit.  

## 2014-02-11 NOTE — Assessment & Plan Note (Signed)
Controlled, no change in medication DASH diet and commitment to daily physical activity for a minimum of 30 minutes discussed and encouraged, as a part of hypertension management. The importance of attaining a healthy weight is also discussed.  

## 2014-02-11 NOTE — Assessment & Plan Note (Signed)
[place] Patient re-educated about  the importance of commitment to a  minimum of 150 minutes of exercise per week. The importance of healthy food choices with portion control discussed. Encouraged to start a food diary, count calories and to consider  joining a support group. Sample diet sheets offered. Goals set by the patient for the next several months.

## 2014-02-11 NOTE — Assessment & Plan Note (Signed)
Improved, pt on no medication and none indicated currently

## 2014-02-11 NOTE — Assessment & Plan Note (Signed)
Vaccine administered at visit.  

## 2014-02-11 NOTE — Assessment & Plan Note (Signed)
Less frreequent A and good response to abortive medication, no change in management

## 2014-02-11 NOTE — Assessment & Plan Note (Signed)
deteriorated Hyperlipidemia:Low fat diet discussed and encouraged.  Updated lab needed at/ before next visit.

## 2014-02-18 ENCOUNTER — Other Ambulatory Visit: Payer: Self-pay

## 2014-02-18 ENCOUNTER — Ambulatory Visit: Payer: BLUE CROSS/BLUE SHIELD

## 2014-02-18 ENCOUNTER — Telehealth: Payer: Self-pay | Admitting: *Deleted

## 2014-02-18 DIAGNOSIS — G8929 Other chronic pain: Principal | ICD-10-CM

## 2014-02-18 DIAGNOSIS — M542 Cervicalgia: Secondary | ICD-10-CM

## 2014-02-18 MED ORDER — KETOROLAC TROMETHAMINE 60 MG/2ML IM SOLN
60.0000 mg | Freq: Once | INTRAMUSCULAR | Status: AC
  Administered 2014-02-18: 60 mg via INTRAMUSCULAR

## 2014-02-18 MED ORDER — PREDNISONE (PAK) 5 MG PO TABS: ORAL_TABLET | ORAL | Status: DC

## 2014-02-18 MED ORDER — METHYLPREDNISOLONE ACETATE 80 MG/ML IJ SUSP
80.0000 mg | Freq: Once | INTRAMUSCULAR | Status: AC
  Administered 2014-02-18: 80 mg via INTRAMUSCULAR

## 2014-02-18 NOTE — Progress Notes (Unsigned)
Ok to give t60 d80 per dr and order med to send to pharmacy prednisone dose pak and order MRI

## 2014-02-18 NOTE — Telephone Encounter (Signed)
Pt aware and will come back today for shots

## 2014-02-18 NOTE — Telephone Encounter (Signed)
Pt called stating she is in a lot of pain and would like to come and get a pain shot and a MRI Thursday please advise [phone]

## 2014-02-18 NOTE — Telephone Encounter (Signed)
Having bad pain in her left upper back (states you already know) . Has been going on awhile but got a lot worse yesterday. Wants something for pain and xray/MRI ordered to check for pinched nerve so she can go Thursday when off work. Patient on the way. What injections can she get? Please advise

## 2014-02-18 NOTE — Telephone Encounter (Signed)
i KNOW OF PAIN IN LEFT NECK, RADUIAITING, IF THAT IS WHAT SHE WANTS INAGED oK TO [facility] mri C SPINE  DX NECK PAIN RADIATING X 5 MONTHS gIVE TORADOL 60MG  AND DEPO MEDROL 80 MG IM IN OFFICE AND SEND IN PREDNISONE 5 MG DOSE PACK X 6 DAYS

## 2014-02-25 ENCOUNTER — Other Ambulatory Visit (HOSPITAL_COMMUNITY): Payer: BLUE CROSS/BLUE SHIELD

## 2014-03-03 ENCOUNTER — Telehealth: Payer: Self-pay | Admitting: Family Medicine

## 2014-03-03 NOTE — Telephone Encounter (Signed)
Patient aware and states that she is still waiting to hear from Dr. Ace Gins.  She will come by and collect CD

## 2014-03-03 NOTE — Telephone Encounter (Signed)
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

## 2014-03-11 ENCOUNTER — Other Ambulatory Visit: Payer: Self-pay

## 2014-03-11 DIAGNOSIS — Z1231 Encounter for screening mammogram for malignant neoplasm of breast: Secondary | ICD-10-CM

## 2014-03-12 ENCOUNTER — Encounter: Payer: Self-pay | Admitting: Family Medicine

## 2014-03-19 ENCOUNTER — Other Ambulatory Visit (HOSPITAL_COMMUNITY): Payer: Self-pay | Admitting: Physical Medicine and Rehabilitation

## 2014-03-19 ENCOUNTER — Ambulatory Visit (HOSPITAL_COMMUNITY)
Admission: RE | Admit: 2014-03-19 | Discharge: 2014-03-19 | Disposition: A | Discharge status: Home / Self Care | Payer: BLUE CROSS/BLUE SHIELD | Source: Ambulatory Visit | Attending: Physical Medicine and Rehabilitation | Admitting: Physical Medicine and Rehabilitation

## 2014-03-19 DIAGNOSIS — M25562 Pain in left knee: Principal | ICD-10-CM

## 2014-03-19 DIAGNOSIS — M25561 Pain in right knee: Secondary | ICD-10-CM

## 2014-03-20 ENCOUNTER — Telehealth: Payer: Self-pay | Admitting: Family Medicine

## 2014-03-20 NOTE — Telephone Encounter (Signed)
Noted  

## 2014-03-21 NOTE — Telephone Encounter (Signed)
Noted  

## 2014-04-04 ENCOUNTER — Ambulatory Visit: Payer: BLUE CROSS/BLUE SHIELD

## 2014-04-15 ENCOUNTER — Ambulatory Visit: Payer: BLUE CROSS/BLUE SHIELD

## 2014-04-15 ENCOUNTER — Ambulatory Visit
Admission: RE | Admit: 2014-04-15 | Discharge: 2014-04-15 | Disposition: A | Discharge status: Home / Self Care | Payer: BLUE CROSS/BLUE SHIELD | Source: Ambulatory Visit

## 2014-04-15 DIAGNOSIS — Z1231 Encounter for screening mammogram for malignant neoplasm of breast: Secondary | ICD-10-CM

## 2014-04-28 ENCOUNTER — Other Ambulatory Visit: Payer: Self-pay | Admitting: Family Medicine

## 2014-04-29 ENCOUNTER — Telehealth: Payer: Self-pay | Admitting: *Deleted

## 2014-04-29 NOTE — Telephone Encounter (Signed)
Pt called stating she needs her blood pressure medication refilled. Please advise

## 2014-04-29 NOTE — Telephone Encounter (Signed)
Med refilled.

## 2014-05-05 ENCOUNTER — Telehealth: Payer: Self-pay | Admitting: Family Medicine

## 2014-05-06 NOTE — Telephone Encounter (Signed)
3rd attempt made this pm to speak with pt , unable to reach her , and unable to leave a message. Pls call her one time, if you are able to get her, pls  Let her know and she needs to provide a good time/ number for me to reach her at if she still needs to spk with me, thanks

## 2014-05-06 NOTE — Telephone Encounter (Signed)
Attempted to return call yesterday and again this morning unable to speak with pt or to leave a message

## 2014-05-08 ENCOUNTER — Telehealth: Payer: Self-pay | Admitting: *Deleted

## 2014-05-08 NOTE — Telephone Encounter (Signed)
Pt called stating Dr. Moshe Cipro called her yesterday on her home phone pt states the best number to reach her at is [phone], pt states Dr. Moshe Cipro can call her after 5 today so she is not interrupting Dr. Griffin Dakin day she knows she is seeing patients. Please advise [phone]

## 2014-05-08 NOTE — Telephone Encounter (Addendum)
Pt states that the 2 epidural injections did not help , she is contemplating implant or c spine surgery, nopyt keen opn spine sugery, will try implant most likel`y, she is to discuss further with Dr Ace Gins

## 2014-05-08 NOTE — Telephone Encounter (Signed)
Attempt to call  

## 2014-05-27 ENCOUNTER — Telehealth: Payer: Self-pay | Admitting: Family Medicine

## 2014-05-27 NOTE — Telephone Encounter (Signed)
Noted  

## 2014-05-27 NOTE — Telephone Encounter (Signed)
noted 

## 2014-05-30 ENCOUNTER — Other Ambulatory Visit: Payer: Self-pay

## 2014-05-30 MED ORDER — AMLODIPINE BESYLATE 5 MG PO TABS
5.0000 mg | ORAL_TABLET | Freq: Every day | ORAL | Status: DC
Start: 2014-05-30 — End: 2014-09-29

## 2014-06-05 ENCOUNTER — Telehealth: Payer: Self-pay | Admitting: Family Medicine

## 2014-06-05 NOTE — Telephone Encounter (Signed)
Needs to continue to take her BP med, she can be offered a nurse visit inn2 weeks to see if the 2 meds are too  much. I assume that Dr Ace Gins already knew she was on BP med when prescribed the patch, probably dose she has is very los on the patch

## 2014-06-05 NOTE — Telephone Encounter (Signed)
Called and left message for patient to return call.  

## 2014-06-06 NOTE — Telephone Encounter (Signed)
Called patient and left message for them to return call at the office   

## 2014-06-06 NOTE — Telephone Encounter (Signed)
Patient aware and will come in one am she is off that week to have a BP check

## 2014-06-13 ENCOUNTER — Telehealth: Payer: Self-pay | Admitting: Family Medicine

## 2014-06-13 ENCOUNTER — Other Ambulatory Visit: Payer: Self-pay | Admitting: Family Medicine

## 2014-06-13 MED ORDER — SYNTHROID 125 MCG PO TABS: 125.0000 ug | ORAL_TABLET | Freq: Every day | ORAL | Status: DC

## 2014-06-13 NOTE — Telephone Encounter (Signed)
refilled 

## 2014-06-19 ENCOUNTER — Telehealth: Payer: Self-pay | Admitting: *Deleted

## 2014-06-19 NOTE — Telephone Encounter (Signed)
noted 

## 2014-06-19 NOTE — Telephone Encounter (Signed)
Pt called wanting to let Dr. Moshe Cipro know that Dr. Ronnald Ramp in [place] is doing her surgery Monday morning.

## 2014-08-19 ENCOUNTER — Telehealth: Payer: Self-pay | Admitting: Family Medicine

## 2014-08-19 NOTE — Telephone Encounter (Signed)
pls work on [employer] I sent re need for appt and labs, many opportunities exist in the Summer, and fFall will be tight so please work with the schedule availability creatively, thanks!

## 2014-08-20 NOTE — Telephone Encounter (Signed)
Her schedule is crazy right now but she will come by the office and collect her lab order and schedule an appt in August

## 2014-09-12 ENCOUNTER — Telehealth: Payer: Self-pay

## 2014-09-12 DIAGNOSIS — R7301 Impaired fasting glucose: Secondary | ICD-10-CM

## 2014-09-12 DIAGNOSIS — E785 Hyperlipidemia, unspecified: Secondary | ICD-10-CM

## 2014-09-12 DIAGNOSIS — I10 Essential (primary) hypertension: Secondary | ICD-10-CM

## 2014-09-12 DIAGNOSIS — E038 Other specified hypothyroidism: Secondary | ICD-10-CM

## 2014-09-12 NOTE — Telephone Encounter (Signed)
p taware I am going to mail updated lab ordered and she had to look at her calendar but she promised she would call back and schedule

## 2014-09-29 ENCOUNTER — Other Ambulatory Visit: Payer: Self-pay | Admitting: Family Medicine

## 2014-10-16 LAB — VITAMIN D 25 HYDROXY (VIT D DEFICIENCY, FRACTURES): Vit D, 25-Hydroxy: 23 ng/mL — ABNORMAL LOW (ref 30–100)

## 2014-10-16 LAB — LIPID PANEL
Cholesterol: 204 mg/dL — ABNORMAL HIGH (ref 125–200)
HDL: 65 mg/dL (ref >=46)
LDL CALC: 126 mg/dL (ref <130)
TRIGLYCERIDES: 63 mg/dL (ref <150)
Total CHOL/HDL Ratio: 3.1 Ratio (ref <=5.0)
VLDL: 13 mg/dL (ref <30)

## 2014-10-16 LAB — COMPREHENSIVE METABOLIC PANEL
ALT: 11 U/L (ref 6–29)
AST: 15 U/L (ref 10–35)
Albumin: 4.2 g/dL (ref 3.6–5.1)
Alkaline Phosphatase: 46 U/L (ref 33–130)
BILIRUBIN TOTAL: 0.9 mg/dL (ref 0.2–1.2)
BUN: 11 mg/dL (ref 7–25)
CALCIUM: 8.9 mg/dL (ref 8.6–10.4)
CO2: 25 mmol/L (ref 20–31)
Chloride: 105 mmol/L (ref 98–110)
Creat: 0.67 mg/dL (ref 0.50–1.05)
Glucose, Bld: 80 mg/dL (ref 65–99)
Potassium: 4 mmol/L (ref 3.5–5.3)
Sodium: 141 mmol/L (ref 135–146)
Total Protein: 6.1 g/dL (ref 6.1–8.1)

## 2014-10-16 LAB — TSH: TSH: 1.667 u[IU]/mL (ref 0.350–4.500)

## 2014-10-16 LAB — HEMOGLOBIN A1C
HEMOGLOBIN A1C: 5.7 % — AB (ref <5.7)
MEAN PLASMA GLUCOSE: 117 mg/dL — AB (ref <117)

## 2014-10-23 ENCOUNTER — Encounter: Payer: Self-pay | Admitting: Family Medicine

## 2014-10-23 ENCOUNTER — Ambulatory Visit (INDEPENDENT_AMBULATORY_CARE_PROVIDER_SITE_OTHER): Payer: BLUE CROSS/BLUE SHIELD | Admitting: Family Medicine

## 2014-10-23 VITALS — BP 126/80 | HR 84 | Resp 18 | Ht 65.0 in | Wt 173.0 lb

## 2014-10-23 DIAGNOSIS — M25561 Pain in right knee: Secondary | ICD-10-CM | POA: Diagnosis not present

## 2014-10-23 DIAGNOSIS — R7302 Impaired glucose tolerance (oral): Secondary | ICD-10-CM

## 2014-10-23 DIAGNOSIS — I10 Essential (primary) hypertension: Secondary | ICD-10-CM

## 2014-10-23 DIAGNOSIS — J302 Other seasonal allergic rhinitis: Secondary | ICD-10-CM

## 2014-10-23 DIAGNOSIS — M25562 Pain in left knee: Secondary | ICD-10-CM

## 2014-10-23 DIAGNOSIS — E038 Other specified hypothyroidism: Secondary | ICD-10-CM

## 2014-10-23 DIAGNOSIS — Z114 Encounter for screening for human immunodeficiency virus [HIV]: Secondary | ICD-10-CM

## 2014-10-23 DIAGNOSIS — Z23 Encounter for immunization: Secondary | ICD-10-CM | POA: Diagnosis not present

## 2014-10-23 DIAGNOSIS — E559 Vitamin D deficiency, unspecified: Secondary | ICD-10-CM

## 2014-10-23 DIAGNOSIS — E785 Hyperlipidemia, unspecified: Secondary | ICD-10-CM | POA: Diagnosis not present

## 2014-10-23 DIAGNOSIS — Z1159 Encounter for screening for other viral diseases: Secondary | ICD-10-CM

## 2014-10-23 MED ORDER — VITAMIN D (ERGOCALCIFEROL) 1.25 MG (50000 UNIT) PO CAPS: 50000.0000 [IU] | ORAL_CAPSULE | ORAL | Status: DC

## 2014-10-23 MED ORDER — AZELASTINE HCL 0.1 % NA SOLN: 2.0000 | Freq: Two times a day (BID) | NASAL | Status: DC

## 2014-10-23 NOTE — Assessment & Plan Note (Signed)
Improved, and on no medication, will work o dietary change, f/u in 5 month

## 2014-10-23 NOTE — Assessment & Plan Note (Signed)
Increased symptoms anticipated between Fall and Spring, Astelin prescribed for as needed use

## 2014-10-23 NOTE — Assessment & Plan Note (Signed)
Controlled, no change in medication  

## 2014-10-23 NOTE — Progress Notes (Signed)
   Subjective:    Patient ID: Gail Cox, female    DOB: Jun 13, 1965, 49 y.o.   MRN: [id number]  HPI   Gail Cox     MRN: [id number]      DOB: 09-Dec-1965   HPI Gail Cox is here for follow up and re-evaluation of chronic medical conditions, medication management and review of any available recent lab and radiology data.  Preventive health is updated, specifically  Cancer screening and Immunization.   Had very successful C spine surgery in May, and now wants ortho opinion on bilateral knee pain.in oral steroids Has had intra articular injections of   by pain management , no success. Denies instability in the knees, states she has had steroid injection to the knees in the past which have helped. No interest in oral steroids The PT denies any adverse reactions to current medications since the last visit.    ROS Denies recent fever or chills. Denies sinus pressure, nasal congestion, ear pain or sore throat.Anticipates Fall and Spring allergies , wants medication sent Denies chest congestion, productive cough or wheezing. Denies chest pains, palpitations and leg swelling Denies abdominal pain, nausea, vomiting,diarrhea or constipation.   Denies dysuria, frequency, hesitancy or incontinence. . Denies headaches, seizures, numbness, or tingling. Denies depression, anxiety or insomnia. Denies skin break down or rash.   PE  BP 126/80 mmHg  Pulse 84  Resp 18  Ht 5\' 5"  (1.651 m)  Wt 173 lb (78.472 kg)  BMI 28.79 kg/m2  SpO2 98%  Patient alert and oriented and in no cardiopulmonary distress.  HEENT: No facial asymmetry, EOMI,   oropharynx pink and moist.  Neck supple no JVD, no mass.  Chest: Clear to auscultation bilaterally.  CVS: S1, S2 no murmurs, no S3.Regular rate.  ABD: Soft non tender.   Ext: No edema  MS: Adequate ROM spine, shoulders, hips and knees.  Skin: Intact, no ulcerations or rash noted.  Psych: Good eye contact, normal affect. Memory intact not  anxious or depressed appearing.  CNS: CN 2-12 intact, power,  normal throughout.no focal deficits noted.   Assessment & Plan   Essential hypertension Controlled, no change in medication DASH diet and commitment to daily physical activity for a minimum of 30 minutes discussed and encouraged, as a part of hypertension management. The importance of attaining a healthy weight is also discussed.  BP/Weight 10/23/2014 02/10/2014 11/18/2013 09/16/2013 07/01/2013 10/18/2012 5/85/2778  Systolic BP 242 353 614 431 540 086 761  Diastolic BP 80 80 90 84 98 94 90  Wt. (Lbs) 173 178.08 176.8 174 170.12 175.12 175  BMI 28.79 29.63 29.42 28.96 28.31 29.14 29.12         Hypothyroidism Controlled, no change in medication   Seasonal allergies Increased symptoms anticipated between Fall and Spring, Astelin prescribed for as needed use  Need for prophylactic vaccination and inoculation against influenza After obtaining informed consent, the vaccine is  administered by LPN.   Bilateral knee pain 1 year h/o worsening pain in both knees requests ortho eval, denies crepitus , or instability, reports benefit from intra articular steroids in the past, will refer to ortho of her choice  Hyperlipidemia LDL goal <100 Improved, and on no medication, will work o dietary change, f/u in 5 month       Review of Systems     Objective:   Physical Exam        Assessment & Plan:

## 2014-10-23 NOTE — Patient Instructions (Addendum)
F/u in 5.5 month, call if you need me before   Flu vaccine  Today  Congrats on weight loss, keep it up Pls stop juices, eat the fresh or frozen fruit instead, smoothie is fine  Aim 64 ounces water daily  You are referred to Dr Mardelle Matte in [place] for knee pain  New is weekly vitamin D and Astelin nasal spray for allergies as needed  Fasting labs in 5.5  Months  Please aim for normal blood sugar and cholesterol , you are almost get there , you can get there , and you want to be there  Please work on good  health habits so that your health will improve. 1. Commitment to daily physical activity for 30 to 60  minutes, if you are able to do this.  2. Commitment to wise food choices. Aim for half of your  food intake to be vegetable and fruit, one quarter starchy foods, and one quarter protein. Try to eat on a regular schedule  3 meals per day, snacking between meals should be limited to vegetables or fruits or small portions of nuts. 64 ounces of water per day is generally recommended, unless you have specific health conditions, like heart failure or kidney failure where you will need to limit fluid intake.  3. Commitment to sufficient and a  good quality of physical and mental rest daily, generally between 6 to 8 hours per day.  WITH PERSISTANCE AND PERSEVERANCE, THE IMPOSSIBLE , BECOMES THE NORM!  Thanks for choosing [facility], we consider it a privelige to serve you.

## 2014-10-23 NOTE — Assessment & Plan Note (Signed)
1 year h/o worsening pain in both knees requests ortho eval, denies crepitus , or instability, reports benefit from intra articular steroids in the past, will refer to ortho of her choice

## 2014-10-23 NOTE — Assessment & Plan Note (Signed)
Controlled, no change in medication DASH diet and commitment to daily physical activity for a minimum of 30 minutes discussed and encouraged, as a part of hypertension management. The importance of attaining a healthy weight is also discussed.  BP/Weight 10/23/2014 02/10/2014 11/18/2013 09/16/2013 07/01/2013 10/18/2012 5/69/7948  Systolic BP 016 553 748 270 786 754 492  Diastolic BP 80 80 90 84 98 94 90  Wt. (Lbs) 173 178.08 176.8 174 170.12 175.12 175  BMI 28.79 29.63 29.42 28.96 28.31 29.14 29.12

## 2014-10-23 NOTE — Assessment & Plan Note (Signed)
After obtaining informed consent, the vaccine is  administered by LPN.  

## 2014-12-16 ENCOUNTER — Telehealth: Payer: Self-pay | Admitting: Family Medicine

## 2014-12-16 MED ORDER — PREDNISONE 5 MG PO TABS: 5.0000 mg | ORAL_TABLET | Freq: Two times a day (BID) | ORAL | Status: DC

## 2014-12-16 NOTE — Telephone Encounter (Signed)
Left message for patient on her voicemail per request and advised her to call back with any questions

## 2014-12-16 NOTE — Telephone Encounter (Signed)
Patient complaining of Cough, chills, sore throat, denies fever, symptoms started yesterday, please advise?

## 2014-12-16 NOTE — Addendum Note (Signed)
Addended by: Eual Fines on: 12/16/2014 02:42 PM   Modules accepted: Orders

## 2014-12-16 NOTE — Telephone Encounter (Signed)
States her throat has been scratchy and sore and for a few days and she has a dry cough and some sneezing. No fever. Recommended salt water gargles but she wanted to be seen or something called in to [facility]. Please advise

## 2014-12-16 NOTE — Telephone Encounter (Signed)
pls send pred 5 mg one twice daily for 5 days.  Please explain to patient that symptoms are consistent with uncontrolled allergic reaction to environment. May be due to change in temperature, pollen count, or change  In type of vegetation prevalent at the time ( like ragweed) Explain that management and control requires DAILY commitment to use of medication for allergies which are safe to use and over the counter. Claritin , which comes in a generic and affordable form (loratidine) , and zyrtec (certrizine ) are safe to take daily, even with hypertension. Advise addition of OTC  Nasal spray like flonase on a daily basis will also help to control the symptoms is tablet alone does not work Advise use of saline nasal flushes (netty pot) 2 to 3 times daily to help to flush excess mucus from nostrils. Advise if excess runny nose , even with high blood pressure, once it is controlled, OK to take sudafed tablet one daily for 3 to 5 days days . Explain that antibiotics are NOT of any benefit and may be potentially harmful if used when not indicated , as in the case of strictly allergy symptoms , when there is no fever .

## 2014-12-26 ENCOUNTER — Other Ambulatory Visit: Payer: Self-pay | Admitting: Family Medicine

## 2015-03-06 ENCOUNTER — Other Ambulatory Visit: Payer: Self-pay | Admitting: Family Medicine

## 2015-03-12 ENCOUNTER — Other Ambulatory Visit: Payer: Self-pay

## 2015-03-12 DIAGNOSIS — Z1231 Encounter for screening mammogram for malignant neoplasm of breast: Secondary | ICD-10-CM

## 2015-03-31 ENCOUNTER — Encounter (HOSPITAL_COMMUNITY): Payer: Self-pay

## 2015-03-31 ENCOUNTER — Emergency Department (HOSPITAL_COMMUNITY)
Admission: EM | Admit: 2015-03-31 | Discharge: 2015-03-31 | Disposition: A | Discharge status: Home / Self Care | Payer: BLUE CROSS/BLUE SHIELD | Attending: Emergency Medicine | Admitting: Emergency Medicine

## 2015-03-31 DIAGNOSIS — E039 Hypothyroidism, unspecified: Secondary | ICD-10-CM | POA: Diagnosis not present

## 2015-03-31 DIAGNOSIS — Y9289 Other specified places as the place of occurrence of the external cause: Secondary | ICD-10-CM | POA: Insufficient documentation

## 2015-03-31 DIAGNOSIS — M549 Dorsalgia, unspecified: Secondary | ICD-10-CM | POA: Insufficient documentation

## 2015-03-31 DIAGNOSIS — R51 Headache: Secondary | ICD-10-CM | POA: Insufficient documentation

## 2015-03-31 DIAGNOSIS — Y999 Unspecified external cause status: Secondary | ICD-10-CM | POA: Diagnosis not present

## 2015-03-31 DIAGNOSIS — Y9389 Activity, other specified: Secondary | ICD-10-CM | POA: Diagnosis not present

## 2015-03-31 DIAGNOSIS — S46812A Strain of other muscles, fascia and tendons at shoulder and upper arm level, left arm, initial encounter: Secondary | ICD-10-CM

## 2015-03-31 DIAGNOSIS — Z9049 Acquired absence of other specified parts of digestive tract: Secondary | ICD-10-CM | POA: Diagnosis not present

## 2015-03-31 DIAGNOSIS — S299XXA Unspecified injury of thorax, initial encounter: Secondary | ICD-10-CM | POA: Diagnosis present

## 2015-03-31 MED ORDER — CYCLOBENZAPRINE HCL 10 MG PO TABS: ORAL_TABLET | ORAL | Status: DC

## 2015-03-31 MED ORDER — DICLOFENAC SODIUM 75 MG PO TBEC: DELAYED_RELEASE_TABLET | ORAL | Status: DC
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
## 2015-04-08 ENCOUNTER — Ambulatory Visit: Payer: BLUE CROSS/BLUE SHIELD | Admitting: Family Medicine

## 2015-04-08 ENCOUNTER — Telehealth: Payer: Self-pay

## 2015-04-08 DIAGNOSIS — E559 Vitamin D deficiency, unspecified: Secondary | ICD-10-CM

## 2015-04-08 DIAGNOSIS — I10 Essential (primary) hypertension: Secondary | ICD-10-CM

## 2015-04-08 DIAGNOSIS — Z114 Encounter for screening for human immunodeficiency virus [HIV]: Secondary | ICD-10-CM

## 2015-04-08 DIAGNOSIS — E785 Hyperlipidemia, unspecified: Secondary | ICD-10-CM

## 2015-04-08 DIAGNOSIS — Z1159 Encounter for screening for other viral diseases: Secondary | ICD-10-CM

## 2015-04-08 DIAGNOSIS — E038 Other specified hypothyroidism: Secondary | ICD-10-CM

## 2015-04-08 DIAGNOSIS — R7302 Impaired glucose tolerance (oral): Secondary | ICD-10-CM

## 2015-04-08 LAB — CBC
HCT: 38.9 % (ref 36.0–46.0)
HEMOGLOBIN: 12.8 g/dL (ref 12.0–15.0)
MCH: 28.5 pg (ref 26.0–34.0)
MCHC: 32.9 g/dL (ref 30.0–36.0)
MCV: 86.6 fL (ref 78.0–100.0)
MPV: 10.2 fL (ref 8.6–12.4)
PLATELETS: 302 10*3/uL (ref 150–400)
RBC: 4.49 MIL/uL (ref 3.87–5.11)
RDW: 13.7 % (ref 11.5–15.5)
WBC: 4.8 10*3/uL (ref 4.0–10.5)

## 2015-04-08 LAB — COMPREHENSIVE METABOLIC PANEL
ALBUMIN: 4.3 g/dL (ref 3.6–5.1)
ALT: 9 U/L (ref 6–29)
AST: 15 U/L (ref 10–35)
Alkaline Phosphatase: 44 U/L (ref 33–130)
BUN: 9 mg/dL (ref 7–25)
CO2: 26 mmol/L (ref 20–31)
CREATININE: 0.77 mg/dL (ref 0.50–1.05)
Calcium: 9.2 mg/dL (ref 8.6–10.4)
Chloride: 103 mmol/L (ref 98–110)
GLUCOSE: 87 mg/dL (ref 65–99)
Potassium: 3.7 mmol/L (ref 3.5–5.3)
SODIUM: 136 mmol/L (ref 135–146)
Total Bilirubin: 0.9 mg/dL (ref 0.2–1.2)
Total Protein: 6.4 g/dL (ref 6.1–8.1)

## 2015-04-08 LAB — LIPID PANEL
CHOL/HDL RATIO: 2.8 ratio (ref <=5.0)
CHOLESTEROL: 197 mg/dL (ref 125–200)
HDL: 71 mg/dL (ref >=46)
LDL Cholesterol: 115 mg/dL (ref <130)
Triglycerides: 56 mg/dL (ref <150)
VLDL: 11 mg/dL (ref <30)

## 2015-04-08 LAB — TSH: TSH: 2.28 mIU/L

## 2015-04-08 NOTE — Telephone Encounter (Signed)
Labs ordered and given to patient

## 2015-04-09 LAB — HEPATITIS C ANTIBODY: HCV Ab: NEGATIVE

## 2015-04-09 LAB — HEMOGLOBIN A1C
Hgb A1c MFr Bld: 5.7 % — ABNORMAL HIGH (ref <5.7)
Mean Plasma Glucose: 117 mg/dL — ABNORMAL HIGH (ref <117)

## 2015-04-09 LAB — VITAMIN D 25 HYDROXY (VIT D DEFICIENCY, FRACTURES): Vit D, 25-Hydroxy: 39 ng/mL (ref 30–100)

## 2015-04-09 LAB — HIV ANTIBODY (ROUTINE TESTING W REFLEX): HIV: NONREACTIVE

## 2015-04-27 ENCOUNTER — Ambulatory Visit: Payer: BLUE CROSS/BLUE SHIELD | Admitting: Family Medicine

## 2015-05-13 ENCOUNTER — Ambulatory Visit: Payer: BLUE CROSS/BLUE SHIELD | Admitting: Family Medicine

## 2015-05-20 ENCOUNTER — Ambulatory Visit
Admission: RE | Admit: 2015-05-20 | Discharge: 2015-05-20 | Disposition: A | Discharge status: Home / Self Care | Payer: BLUE CROSS/BLUE SHIELD | Source: Ambulatory Visit

## 2015-05-20 DIAGNOSIS — Z1231 Encounter for screening mammogram for malignant neoplasm of breast: Secondary | ICD-10-CM

## 2015-06-20 ENCOUNTER — Encounter (HOSPITAL_COMMUNITY): Payer: Self-pay | Admitting: Emergency Medicine

## 2015-06-20 ENCOUNTER — Emergency Department (HOSPITAL_COMMUNITY): Payer: BLUE CROSS/BLUE SHIELD

## 2015-06-20 ENCOUNTER — Emergency Department (HOSPITAL_COMMUNITY)
Admission: EM | Admit: 2015-06-20 | Discharge: 2015-06-20 | Disposition: A | Discharge status: Home / Self Care | Payer: BLUE CROSS/BLUE SHIELD | Attending: Emergency Medicine | Admitting: Emergency Medicine

## 2015-06-20 DIAGNOSIS — Z7982 Long term (current) use of aspirin: Secondary | ICD-10-CM | POA: Insufficient documentation

## 2015-06-20 DIAGNOSIS — M25561 Pain in right knee: Secondary | ICD-10-CM | POA: Diagnosis present

## 2015-06-20 DIAGNOSIS — E039 Hypothyroidism, unspecified: Secondary | ICD-10-CM | POA: Diagnosis not present

## 2015-06-20 MED ORDER — KETOROLAC TROMETHAMINE 60 MG/2ML IM SOLN
60.0000 mg | Freq: Once | INTRAMUSCULAR | Status: AC
  Administered 2015-06-20: 60 mg via INTRAMUSCULAR
  Filled 2015-06-20: qty 2

## 2015-06-20 MED ORDER — MELOXICAM 15 MG PO TABS
15.0000 mg | ORAL_TABLET | Freq: Every day | ORAL | Status: DC
Start: 2015-06-20 — End: 2015-08-10

## 2015-06-20 NOTE — ED Notes (Signed)
Patient c/o right knee pain and swelling with no known injury. Patient states "It feels tight."

## 2015-06-20 NOTE — ED Notes (Signed)
EDP at bedside  

## 2015-06-20 NOTE — Discharge Instructions (Signed)
How to Use a Knee Brace A knee brace is a device that you wear to support your knee, especially if the knee is healing after an injury or surgery. There are several types of knee braces. Some are designed to prevent an injury (prophylactic brace). These are often worn during sports. Others support an injured knee (functional brace) or keep it still while it heals (rehabilitative brace). People with severe arthritis of the knee may benefit from a brace that takes some pressure off the knee (unloader brace). Most knee braces are made from a combination of cloth and metal or plastic.  You may need to wear a knee brace to:  Relieve knee pain.  Help your knee support your weight (improve stability).  Help you walk farther (improve mobility).  Prevent injury.  Support your knee while it heals from surgery or from an injury. RISKS AND COMPLICATIONS Generally, knee braces are very safe to wear. However, problems may occur, including:  Skin irritation that may lead to infection.  Making your condition worse if you wear the brace in the wrong way. HOW TO USE A KNEE BRACE Different braces will have different instructions for use. Your health care provider will tell you or show you:  How to put on your brace.  How to adjust the brace.  When and how often to wear the brace.  How to remove the brace.  If you will need any assistive devices in addition to the brace, such as crutches or a cane. In general, your brace should:  Have the hinge of the brace line up with the bend of your knee.  Have straps, hooks, or tapes that fasten snugly around your leg.  Not feel too tight or too loose. HOW TO CARE FOR A KNEE BRACE  Check your brace often for signs of damage, such as loose connections or attachments. Your knee brace may get damaged or wear out during normal use.  Wash the fabric parts of your brace with soap and water.  Read the insert that comes with your brace for other specific care  instructions. SEEK MEDICAL CARE IF:  Your knee brace is too loose or too tight and you cannot adjust it.  Your knee brace causes skin redness, swelling, bruising, or irritation.  Your knee brace is not helping.  Your knee brace is making your knee pain worse.   This information is not intended to replace advice given to you by your health care provider. Make sure you discuss any questions you have with your health care provider.   Document Released: 04/09/2003 Document Revised: 10/08/2014 Document Reviewed: 05/12/2014 Elsevier Interactive Patient Education 2016 Else[employer] Inc[employer]  Cryotherapy Cryotherapy is when you put ice on your injury. Ice helps lessen pain and puffiness (swelling) after an injury. Ice works the best when you start using it in the first 24 to 48 hours after an injury. HOME CARE  Put a dry or damp towel between the ice pack and your skin.  You may press gently on the ice pack.  Leave the ice on for no more than 10 to 20 minutes at a time.  Check your skin after 5 minutes to make sure your skin is okay.  Rest at least 20 minutes between ice pack uses.  Stop using ice when your skin loses feeling (numbness).  Do not use ice on someone who cannot tell you when it hurts. This includes small children and people with memory problems (dementia). GET HELP RIGHT AWAY IF:  You have white spots on your skin.  Your skin turns blue or pale.  Your skin feels waxy or hard.  Your puffiness gets worse. MAKE SURE YOU:   Understand these instructions.  Will watch your condition.  Will get help right away if you are not doing well or get worse.   This information is not intended to replace advice given to you by your health care provider. Make sure you discuss any questions you have with your health care provider.   Document Released: 07/06/2007 Document Revised: 04/11/2011 Document Reviewed: 09/09/2010 Elsevier Interactive Patient Education [employer].

## 2015-06-20 NOTE — ED Provider Notes (Signed)
CSN: [id number]     Arrival date & time 06/20/15  [id number] History   First MD Initiated Contact with Patient 06/20/15 [phone]     Chief Complaint  Patient presents with  . Knee Pain     (Consider location/radiation/quality/duration/timing/severity/associated sxs/prior Treatment) HPI   Gail Cox is a 50 y.o. female who presents to the Emergency Department complaining of worsening of her right knee pain for several days.  She states that this is a recurrent problem, And has been told by her orthopedic surgeon that she may need surgery in the near future. She states that for several days her right knee has become more painful and she has noticed swelling to the knee. She denies known injury but states that she works for [employer] and does a lot of walking and standing. She describes the pain as aching and feels tight. She denies known injury, numbness or weakness of the extremity, redness, fever or chills. She's been wearing a elastic knee brace with minimal relief.   Past Medical History  Diagnosis Date  . Allergic rhinitis, seasonal   . Hypothyroidism   . Migraine headache    Past Surgical History  Procedure Laterality Date  . Cholecystectomy  1999  . Ovarian cyst surgery  2003    Removed and uterus  . Thyroidectomy  2006  . Cystectomy  1977    Right arm  . Cystectomy  2007    Left breast  . Colonoscopy  09/21/2011    Procedure: COLONOSCOPY;  Surgeon: Rogene Houston, MD;  Location: [facility];  Service: Endoscopy;  Laterality: N/A;  830  . Spine surgery  2016    cervical spine   Family History  Problem Relation Age of Onset  . Lung cancer Mother   . Diabetes Father   . Coronary artery disease Maternal Grandmother    Social History  Substance Use Topics  . Smoking status: Never Smoker   . Smokeless tobacco: Never Used  . Alcohol Use: No   OB History    No data available     Review of Systems  Constitutional: Negative for fever and chills.  Genitourinary: Negative  for dysuria and difficulty urinating.  Musculoskeletal: Positive for joint swelling and arthralgias (Right knee pain and swelling).  Skin: Negative for color change and wound.  All other systems reviewed and are negative.     Allergies  Hydrocodone-acetaminophen and Tyloxapol  Home Medications   Prior to Admission medications   Medication Sig Start Date End Date Taking? Authorizing Provider  albuterol (PROVENTIL HFA;VENTOLIN HFA) 108 (90 BASE) MCG/ACT inhaler Inhale 2 puffs into the lungs every 6 (six) hours as needed. For shortness of breath    Historical Provider, MD  amLODipine (NORVASC) 5 MG tablet TAKE ONE TABLET BY MOUTH ONCE DAILY 09/30/14   Fayrene Helper, MD  amLODipine (NORVASC) 5 MG tablet TAKE ONE TABLET BY MOUTH ONCE DAILY Patient not taking: Reported on 03/31/2015 12/29/14   Fayrene Helper, MD  Aspirin-Acetaminophen-Caffeine (MIGRAINE RELIEF PO) Take 1 tablet by mouth as needed. For migraine    Historical Provider, MD  azelastine (ASTELIN) 0.1 % nasal spray Place 2 sprays into both nostrils 2 (two) times daily. Use in each nostril as directed 10/23/14   Fayrene Helper, MD  cyclobenzaprine (FLEXERIL) 10 MG tablet 1 at hs and q6h prn spasm pain 03/31/15   Lily Kocher, PA-C  diclofenac (VOLTAREN) 75 MG EC tablet Please take with food 03/31/15   Lily Kocher, PA-C  diphenhydramine-acetaminophen (TYLENOL PM) 25-500 MG TABS Take 1 tablet by mouth at bedtime as needed. For migraine    Historical Provider, MD  Doxylamine Succinate, Sleep, (SLEEP AID PO) Take 1-2 tablets by mouth as needed. For sleep    Historical Provider, MD  predniSONE (DELTASONE) 5 MG tablet Take 1 tablet (5 mg total) by mouth 2 (two) times daily with a meal. Patient not taking: Reported on 03/31/2015 12/16/14   Fayrene Helper, MD  SYNTHROID 125 MCG tablet TAKE ONE TABLET BY MOUTH ONCE DAILY 03/09/15   Fayrene Helper, MD  Vitamin D, Ergocalciferol, (DRISDOL) 50000 UNITS CAPS capsule Take 1 capsule  (50,000 Units total) by mouth every 7 (seven) days. 10/23/14   Fayrene Helper, MD   BP 138/98 mmHg  Pulse 78  Temp(Src) 97.9 F (36.6 C) (Oral)  Resp 18  Ht 5\' 5"  (1.651 m)  Wt 80.74 kg  BMI 29.62 kg/m2  SpO2 100% Physical Exam  Constitutional: She is oriented to person, place, and time. She appears well-developed and well-nourished. No distress.  Cardiovascular: Normal rate, regular rhythm and intact distal pulses.   Pulmonary/Chest: Effort normal and breath sounds normal. No respiratory distress.  Musculoskeletal: She exhibits edema and tenderness.  Diffuse ttp of the anterior right knee.  Mild edema with mild crepitus on range of motion.  No erythema, or step-off deformity.  DP pulse brisk, distal sensation intact. Calf is soft and NT.  Neurological: She is alert and oriented to person, place, and time. She exhibits normal muscle tone. Coordination normal.  Skin: Skin is warm and dry. No erythema.  Nursing note and vitals reviewed.   ED Course  Procedures (including critical care time) Labs Review Labs Reviewed - No data to display  Imaging Review Dg Knee Complete 4 Views Right  06/20/2015  CLINICAL DATA:  50 year old female with a history of edema and pain EXAM: RIGHT KNEE - COMPLETE 4+ VIEW COMPARISON:  03/19/2014 FINDINGS: No acute fracture. Medial greater than lateral joint space narrowing with marginal osteophyte formation and subchondral sclerosis. No joint effusion. Patellofemoral degenerative changes. Overall appearance is similar to comparison. No radiopaque foreign body. IMPRESSION: Negative for acute bony abnormality. Advanced tricompartmental osteoarthritis, worst at the medial compartment. Signed, Dulcy Fanny. Earleen Newport, DO [facility] Electronically Signed   By: Corrie Mckusick D.O.   On: 06/20/2015 09:37   I have personally reviewed and evaluated these images and lab results as part of my medical  decision-making.   EKG Interpretation None      MDM   Final diagnoses:  Knee pain, right    Patient well appearing. Vital signs are stable. Mild to moderate tenderness and mild edema of the anterior knee. Known history of osteoarthritis of the knee patient is currently followed by Dr. Mardelle Matte.  no concerning sx's for septic joint.  NV intact.  She agrees to symptomatic treatment with Ace wrap and knee immobilizer for comfort she will follow-up with Dr. Mardelle Matte.    Kem Parkinson, PA-C 06/20/15 1046  Davonna Belling, MD 06/20/15 [phone]

## 2015-08-10 ENCOUNTER — Ambulatory Visit (INDEPENDENT_AMBULATORY_CARE_PROVIDER_SITE_OTHER): Payer: BLUE CROSS/BLUE SHIELD | Admitting: Family Medicine

## 2015-08-10 ENCOUNTER — Encounter: Payer: Self-pay | Admitting: Family Medicine

## 2015-08-10 VITALS — BP 128/80 | HR 86 | Resp 18 | Ht 65.0 in | Wt 171.0 lb

## 2015-08-10 DIAGNOSIS — E785 Hyperlipidemia, unspecified: Secondary | ICD-10-CM

## 2015-08-10 DIAGNOSIS — I10 Essential (primary) hypertension: Secondary | ICD-10-CM | POA: Diagnosis not present

## 2015-08-10 DIAGNOSIS — E038 Other specified hypothyroidism: Secondary | ICD-10-CM

## 2015-08-10 DIAGNOSIS — M25561 Pain in right knee: Secondary | ICD-10-CM

## 2015-08-10 DIAGNOSIS — J302 Other seasonal allergic rhinitis: Secondary | ICD-10-CM

## 2015-08-10 DIAGNOSIS — R7302 Impaired glucose tolerance (oral): Secondary | ICD-10-CM

## 2015-08-10 DIAGNOSIS — M25562 Pain in left knee: Secondary | ICD-10-CM

## 2015-08-10 DIAGNOSIS — J301 Allergic rhinitis due to pollen: Secondary | ICD-10-CM

## 2015-08-10 DIAGNOSIS — E669 Obesity, unspecified: Secondary | ICD-10-CM

## 2015-08-10 DIAGNOSIS — J0100 Acute maxillary sinusitis, unspecified: Secondary | ICD-10-CM | POA: Insufficient documentation

## 2015-08-10 MED ORDER — AZELASTINE HCL 0.1 % NA SOLN: 2.0000 | Freq: Two times a day (BID) | NASAL | Status: DC

## 2015-08-10 MED ORDER — AZITHROMYCIN 250 MG PO TABS: ORAL_TABLET | ORAL | Status: DC

## 2015-08-10 NOTE — Assessment & Plan Note (Signed)
Uncontrolled, needs to use astelin daily

## 2015-08-10 NOTE — Progress Notes (Signed)
Gail Cox     MRN: [id number]      DOB: September 28, 1965   HPI Gail Cox is here for follow up and re-evaluation of chronic medical conditions, medication management and review of any available recent lab and radiology data.  Preventive health is updated, specifically  Cancer screening and Immunization.   Questions or concerns regarding consultations or procedures which the PT has had in the interim are  addressed. The PT denies any adverse reactions to current medications since the last visit.  C/o sinus pressure , chills and yellowish nasal drainage x 3 days c/o left ear pain , and tender left neck glands, denies cough ROS  Denies chest pains, palpitations and leg swelling Denies abdominal pain, nausea, vomiting,diarrhea or constipation.   Denies dysuria, frequency, hesitancy or incontinence. Denies joint pain, swelling and limitation in mobility. Denies headaches, seizures, numbness, or tingling. Denies depression, anxiety or insomnia. Denies skin break down or rash.   PE  BP 128/80 mmHg  Pulse 86  Resp 18  Ht 5\' 5"  (1.651 m)  Wt 171 lb (77.565 kg)  BMI 28.46 kg/m2  SpO2 99%  Patient alert and oriented and in no cardiopulmonary distress.  HEENT: No facial asymmetry, EOMI,   oropharynx pink and moist.  Neck supple no JVD, no mass.Left TM mildly erythematous , left Tm maxillary sinus tender  Chest: Clear to auscultation bilaterally.  CVS: S1, S2 no murmurs, no S3.Regular rate.  ABD: Soft non tender.   Ext: No edema  MS: Adequate ROM spine, shoulders, hips and knees.  Skin: Intact, no ulcerations or rash noted.  Psych: Good eye contact, normal affect. Memory intact not anxious or depressed appearing.  CNS: CN 2-12 intact, power,  normal throughout.no focal deficits noted.   Assessment & Plan  Essential hypertension Controlled, no change in medication DASH diet and commitment to daily physical activity for a minimum of 30 minutes discussed and encouraged, as  a part of hypertension management. The importance of attaining a healthy weight is also discussed.  BP/Weight 08/10/2015 06/20/2015 03/31/2015 10/23/2014 02/10/2014 11/18/2013 0000000  Systolic BP 0000000 0000000 123XX123 123XX123 Q000111Q [id number] 123456  Diastolic BP 80 65 80 80 80 90 84  Wt. (Lbs) 171 178 170 173 178.08 176.8 174  BMI 28.46 29.62 28.29 28.79 29.63 29.42 28.96        Seasonal allergies Uncontrolled, needs to use astelin daily  Hypothyroidism Controlled, no change in medication Updated lab needed at/ before next visit.   Obesity (BMI 30.0-34.9) Unchnaged. Patient re-educated about  the importance of commitment to a  minimum of 150 minutes of exercise per week.  The importance of healthy food choices with portion control discussed. Encouraged to start a food diary, count calories and to consider  joining a support group. Sample diet sheets offered. Goals set by the patient for the next several months.   Weight /BMI 08/10/2015 06/20/2015 03/31/2015  WEIGHT 171 lb 178 lb 170 lb  HEIGHT 5\' 5"  5\' 5"  5\' 5"   BMI 28.46 kg/m2 29.62 kg/m2 28.29 kg/m2       Hyperlipidemia LDL goal <100 Hyperlipidemia:Low fat diet discussed and encouraged. Updated lab needed at/ before next visit.   Lipid Panel  Lab Results  Component Value Date   CHOL 197 04/08/2015   HDL 71 04/08/2015   LDLCALC 115 04/08/2015   TRIG 56 04/08/2015   CHOLHDL 2.8 04/08/2015        IGT (impaired glucose tolerance) Patient educated about the importance of limiting  Carbohydrate intake , the need to commit to daily physical activity for a minimum of 30 minutes , and to commit weight loss. The fact that changes in all these areas will reduce or eliminate all together the development of diabetes is stressed.   Diabetic Labs Latest Ref Rng 04/08/2015 10/15/2014 02/08/2014 07/01/2013 08/28/2012  HbA1c <5.7 % 5.7(H) 5.7(H) - 5.6 5.0  Chol 125 - 200 mg/dL 197 204(H) 208(H) - 184  HDL >=46 mg/dL 71 65 69 - 63  Calc LDL <130 mg/dL 115  126 127(H) - 106(H)  Triglycerides <150 mg/dL 56 63 62 - 76  Creatinine 0.50 - 1.05 mg/dL 0.77 0.67 0.75 0.71 0.73   BP/Weight 08/10/2015 06/20/2015 03/31/2015 10/23/2014 02/10/2014 11/18/2013 0000000  Systolic BP 0000000 0000000 123XX123 123XX123 Q000111Q [id number] 123456  Diastolic BP 80 65 80 80 80 90 84  Wt. (Lbs) 171 178 170 173 178.08 176.8 174  BMI 28.46 29.62 28.29 28.79 29.63 29.42 28.96   No flowsheet data found.     Maxillary sinusitis, acute Left maxillary sinus tender, and left tM inflamed, z pack presctribed  ALLERGIC RHINITIS, SEASONAL Uncontrolled, needs to commit to daily Astelin, and saline nasal flushes  Bilateral knee pain Improved, with intra articular injections

## 2015-08-10 NOTE — Assessment & Plan Note (Signed)
Uncontrolled, needs to commit to daily Astelin, and saline nasal flushes

## 2015-08-10 NOTE — Assessment & Plan Note (Signed)
Improved, with intra articular injections

## 2015-08-10 NOTE — Patient Instructions (Signed)
F/u early January, call if you need me before  You are treaed for sinus infection, antibiotic sent to pharmacy  Please change eating habits as blood sugar is too high  It is important that you exercise regularly at least 30 minutes 5 times a week. If you develop chest pain, have severe difficulty breathing, or feel very tired, stop exercising immediately and seek medical attention     Start using Astelin daily  Fasting labs in 5 months  Thank you  for choosing [facility]. We consider it a privelige to serve you.  Delivering excellent health care in a caring and  compassionate way is our goal.  Partnering with you,  so that together we can achieve this goal is our strategy.

## 2015-08-10 NOTE — Assessment & Plan Note (Signed)
Unchnaged. Patient re-educated about  the importance of commitment to a  minimum of 150 minutes of exercise per week.  The importance of healthy food choices with portion control discussed. Encouraged to start a food diary, count calories and to consider  joining a support group. Sample diet sheets offered. Goals set by the patient for the next several months.   Weight /BMI 08/10/2015 06/20/2015 03/31/2015  WEIGHT 171 lb 178 lb 170 lb  HEIGHT 5\' 5"  5\' 5"  5\' 5"   BMI 28.46 kg/m2 29.62 kg/m2 28.29 kg/m2

## 2015-08-10 NOTE — Assessment & Plan Note (Signed)
Hyperlipidemia:Low fat diet discussed and encouraged. Updated lab needed at/ before next visit.   Lipid Panel  Lab Results  Component Value Date   CHOL 197 04/08/2015   HDL 71 04/08/2015   LDLCALC 115 04/08/2015   TRIG 56 04/08/2015   CHOLHDL 2.8 04/08/2015

## 2015-08-10 NOTE — Assessment & Plan Note (Signed)
Controlled, no change in medication Updated lab needed at/ before next visit.  

## 2015-08-10 NOTE — Assessment & Plan Note (Signed)
Patient educated about the importance of limiting  Carbohydrate intake , the need to commit to daily physical activity for a minimum of 30 minutes , and to commit weight loss. The fact that changes in all these areas will reduce or eliminate all together the development of diabetes is stressed.   Diabetic Labs Latest Ref Rng 04/08/2015 10/15/2014 02/08/2014 07/01/2013 08/28/2012  HbA1c <5.7 % 5.7(H) 5.7(H) - 5.6 5.0  Chol 125 - 200 mg/dL 197 204(H) 208(H) - 184  HDL >=46 mg/dL 71 65 69 - 63  Calc LDL <130 mg/dL 115 126 127(H) - 106(H)  Triglycerides <150 mg/dL 56 63 62 - 76  Creatinine 0.50 - 1.05 mg/dL 0.77 0.67 0.75 0.71 0.73   BP/Weight 08/10/2015 06/20/2015 03/31/2015 10/23/2014 02/10/2014 11/18/2013 0000000  Systolic BP 0000000 0000000 123XX123 123XX123 Q000111Q [id number] 123456  Diastolic BP 80 65 80 80 80 90 84  Wt. (Lbs) 171 178 170 173 178.08 176.8 174  BMI 28.46 29.62 28.29 28.79 29.63 29.42 28.96   No flowsheet data found.

## 2015-08-10 NOTE — Assessment & Plan Note (Signed)
Controlled, no change in medication DASH diet and commitment to daily physical activity for a minimum of 30 minutes discussed and encouraged, as a part of hypertension management. The importance of attaining a healthy weight is also discussed.  BP/Weight 08/10/2015 06/20/2015 03/31/2015 10/23/2014 02/10/2014 11/18/2013 0000000  Systolic BP 0000000 0000000 123XX123 123XX123 Q000111Q [id number] 123456  Diastolic BP 80 65 80 80 80 90 84  Wt. (Lbs) 171 178 170 173 178.08 176.8 174  BMI 28.46 29.62 28.29 28.79 29.63 29.42 28.96

## 2015-08-10 NOTE — Assessment & Plan Note (Signed)
Left maxillary sinus tender, and left tM inflamed, z pack presctribed

## 2015-08-12 ENCOUNTER — Telehealth: Payer: Self-pay | Admitting: Family Medicine

## 2015-08-12 ENCOUNTER — Other Ambulatory Visit: Payer: Self-pay | Admitting: Family Medicine

## 2015-08-12 MED ORDER — SYNTHROID 125 MCG PO TABS: 125.0000 ug | ORAL_TABLET | Freq: Every day | ORAL | Status: DC

## 2015-08-12 NOTE — Telephone Encounter (Signed)
Gail Cox is calling to request a refill on her SYNTHROID 125 MCG tablet please advise?

## 2015-08-12 NOTE — Telephone Encounter (Signed)
Med refilled.

## 2015-08-17 ENCOUNTER — Telehealth: Payer: Self-pay | Admitting: Family Medicine

## 2015-08-17 MED ORDER — AMPICILLIN 500 MG PO CAPS: 500.0000 mg | ORAL_CAPSULE | Freq: Four times a day (QID) | ORAL | Status: AC

## 2015-08-17 NOTE — Telephone Encounter (Signed)
Pt aware.

## 2015-08-17 NOTE — Telephone Encounter (Signed)
10 day course of ampicillin prescribed

## 2015-08-17 NOTE — Telephone Encounter (Signed)
Please advise 

## 2015-08-17 NOTE — Telephone Encounter (Signed)
Gail Cox is still complaining of a lot of drainage and green mucos, headaches and she states her left ear still hurts, finished the Z-pak , please advise?

## 2015-08-24 ENCOUNTER — Other Ambulatory Visit: Payer: Self-pay | Admitting: Family Medicine

## 2015-10-23 ENCOUNTER — Other Ambulatory Visit: Payer: Self-pay | Admitting: Family Medicine

## 2015-10-27 ENCOUNTER — Ambulatory Visit (INDEPENDENT_AMBULATORY_CARE_PROVIDER_SITE_OTHER): Payer: BLUE CROSS/BLUE SHIELD

## 2015-10-27 DIAGNOSIS — Z23 Encounter for immunization: Secondary | ICD-10-CM

## 2015-12-29 ENCOUNTER — Other Ambulatory Visit: Payer: Self-pay | Admitting: Family Medicine

## 2016-02-26 ENCOUNTER — Ambulatory Visit (INDEPENDENT_AMBULATORY_CARE_PROVIDER_SITE_OTHER): Payer: BLUE CROSS/BLUE SHIELD | Admitting: Family Medicine

## 2016-02-26 ENCOUNTER — Encounter: Payer: Self-pay | Admitting: Family Medicine

## 2016-02-26 VITALS — BP 124/80 | HR 96 | Resp 16 | Ht 65.0 in | Wt 169.0 lb

## 2016-02-26 DIAGNOSIS — M546 Pain in thoracic spine: Secondary | ICD-10-CM

## 2016-02-26 DIAGNOSIS — E038 Other specified hypothyroidism: Secondary | ICD-10-CM | POA: Diagnosis not present

## 2016-02-26 DIAGNOSIS — Z23 Encounter for immunization: Secondary | ICD-10-CM | POA: Diagnosis not present

## 2016-02-26 DIAGNOSIS — J301 Allergic rhinitis due to pollen: Secondary | ICD-10-CM

## 2016-02-26 DIAGNOSIS — I10 Essential (primary) hypertension: Secondary | ICD-10-CM

## 2016-02-26 DIAGNOSIS — E559 Vitamin D deficiency, unspecified: Secondary | ICD-10-CM | POA: Diagnosis not present

## 2016-02-26 DIAGNOSIS — E663 Overweight: Secondary | ICD-10-CM

## 2016-02-26 NOTE — Patient Instructions (Signed)
F/u  In September, call if you need  Me before  Labs todayTSH , chem 7 , vit D   TdAP today  You are referred to therpay for chronic upper back pain
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
## 2016-02-27 LAB — COMPREHENSIVE METABOLIC PANEL
ALT: 10 U/L (ref 6–29)
AST: 16 U/L (ref 10–35)
Albumin: 4.3 g/dL (ref 3.6–5.1)
Alkaline Phosphatase: 41 U/L (ref 33–130)
BUN: 9 mg/dL (ref 7–25)
CALCIUM: 9.3 mg/dL (ref 8.6–10.4)
CO2: 28 mmol/L (ref 20–31)
Chloride: 106 mmol/L (ref 98–110)
Creat: 0.71 mg/dL (ref 0.50–1.05)
Glucose, Bld: 87 mg/dL (ref 65–99)
POTASSIUM: 4 mmol/L (ref 3.5–5.3)
Sodium: 141 mmol/L (ref 135–146)
Total Bilirubin: 0.9 mg/dL (ref 0.2–1.2)
Total Protein: 6.4 g/dL (ref 6.1–8.1)

## 2016-02-27 LAB — TSH: TSH: 1.25 m[IU]/L

## 2016-02-28 NOTE — Assessment & Plan Note (Signed)
Improved. Patient re-educated about  the importance of commitment to a  minimum of 150 minutes of exercise per week.  The importance of healthy food choices with portion control discussed. Encouraged to start a food diary, count calories and to consider  joining a support group. Sample diet sheets offered. Goals set by the patient for the next several months.   Weight /BMI 02/26/2016 08/10/2015 06/20/2015  WEIGHT 169 lb 171 lb 178 lb  HEIGHT 5\' 5"  5\' 5"  5\' 5"   BMI 28.12 kg/m2 28.46 kg/m2 29.62 kg/m2

## 2016-02-28 NOTE — Assessment & Plan Note (Signed)
Controlled, no change in medication DASH diet and commitment to daily physical activity for a minimum of 30 minutes discussed and encouraged, as a part of hypertension management. The importance of attaining a healthy weight is also discussed.  BP/Weight 02/26/2016 08/10/2015 06/20/2015 03/31/2015 10/23/2014 02/10/2014 [id number]  Systolic BP A999333 0000000 0000000 123XX123 123XX123 Q000111Q [id number]  Diastolic BP 80 80 65 80 80 80 90  Wt. (Lbs) 169 171 178 170 173 178.08 176.8  BMI 28.12 28.46 29.62 28.29 28.79 29.63 29.42

## 2016-02-28 NOTE — Assessment & Plan Note (Signed)
Updated lab needed at/ before next visit. Controlled when last assesed

## 2016-02-28 NOTE — Assessment & Plan Note (Signed)
Stable, no current flare 

## 2016-02-28 NOTE — Assessment & Plan Note (Signed)
After obtaining informed consent, the vaccine is  administered by LPN.  

## 2016-02-29 LAB — VITAMIN D 25 HYDROXY (VIT D DEFICIENCY, FRACTURES): VIT D 25 HYDROXY: 39 ng/mL (ref 30–100)

## 2016-03-03 ENCOUNTER — Ambulatory Visit (HOSPITAL_COMMUNITY): Payer: No Typology Code available for payment source | Attending: Family Medicine | Admitting: Physical Therapy

## 2016-03-03 ENCOUNTER — Encounter (HOSPITAL_COMMUNITY): Payer: Self-pay | Admitting: Physical Therapy

## 2016-03-03 DIAGNOSIS — M62830 Muscle spasm of back: Secondary | ICD-10-CM | POA: Insufficient documentation

## 2016-03-03 DIAGNOSIS — M546 Pain in thoracic spine: Secondary | ICD-10-CM | POA: Diagnosis not present
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
## 2016-03-04 ENCOUNTER — Encounter: Payer: Self-pay | Admitting: Internal Medicine

## 2016-03-23 ENCOUNTER — Other Ambulatory Visit: Payer: Self-pay | Admitting: Family Medicine

## 2016-04-15 ENCOUNTER — Other Ambulatory Visit: Payer: Self-pay | Admitting: Family Medicine

## 2016-04-15 DIAGNOSIS — Z1231 Encounter for screening mammogram for malignant neoplasm of breast: Secondary | ICD-10-CM

## 2016-05-25 ENCOUNTER — Ambulatory Visit
Admission: RE | Admit: 2016-05-25 | Discharge: 2016-05-25 | Disposition: A | Discharge status: Home / Self Care | Payer: BLUE CROSS/BLUE SHIELD | Source: Ambulatory Visit | Attending: Family Medicine | Admitting: Family Medicine

## 2016-05-25 DIAGNOSIS — Z1231 Encounter for screening mammogram for malignant neoplasm of breast: Secondary | ICD-10-CM

## 2016-06-01 ENCOUNTER — Telehealth: Payer: Self-pay

## 2016-06-01 DIAGNOSIS — E038 Other specified hypothyroidism: Secondary | ICD-10-CM

## 2016-06-01 DIAGNOSIS — E785 Hyperlipidemia, unspecified: Secondary | ICD-10-CM

## 2016-06-01 DIAGNOSIS — R7301 Impaired fasting glucose: Secondary | ICD-10-CM

## 2016-06-01 DIAGNOSIS — I10 Essential (primary) hypertension: Secondary | ICD-10-CM

## 2016-06-01 NOTE — Telephone Encounter (Signed)
Message left to call back and schedule appt end of august and that I was mailing her fasting labs

## 2016-06-01 NOTE — Telephone Encounter (Signed)
-----   Message from Fayrene Helper, MD sent at 05/27/2016  1:35 PM EDT ----- Regarding: pls contact pt Try to get her to schedule appt for end August/ early Sept  Will need CBC, fasting lipid, chem 7, TSH and hBA1C 3 days before, and hopefully  will get flu vac then has 2 chronic med illnesses, should come in , no appt in system good luck!

## 2016-06-25 ENCOUNTER — Other Ambulatory Visit: Payer: Self-pay | Admitting: Family Medicine

## 2016-06-28 ENCOUNTER — Other Ambulatory Visit: Payer: Self-pay

## 2016-06-28 MED ORDER — AMLODIPINE BESYLATE 5 MG PO TABS
5.0000 mg | ORAL_TABLET | Freq: Every day | ORAL | 1 refills | Status: DC
Start: 2016-06-28 — End: 2016-12-21

## 2016-06-28 NOTE — Telephone Encounter (Signed)
Patient calling re Rx amlodipine. She does not have any left and has not taken the last couple of days.  Please call in @ [facility] in [place]

## 2016-09-09 ENCOUNTER — Encounter: Payer: Self-pay | Admitting: *Deleted

## 2016-09-19 ENCOUNTER — Other Ambulatory Visit: Payer: Self-pay | Admitting: Family Medicine

## 2016-09-19 LAB — CBC
HCT: 39.6 % (ref 35.0–45.0)
Hemoglobin: 13 g/dL (ref 11.7–15.5)
MCH: 29.1 pg (ref 27.0–33.0)
MCHC: 32.8 g/dL (ref 32.0–36.0)
MCV: 88.8 fL (ref 80.0–100.0)
MPV: 10.2 fL (ref 7.5–12.5)
PLATELETS: 298 10*3/uL (ref 140–400)
RBC: 4.46 MIL/uL (ref 3.80–5.10)
RDW: 13.4 % (ref 11.0–15.0)
WBC: 4.1 10*3/uL (ref 3.8–10.8)

## 2016-09-20 LAB — BASIC METABOLIC PANEL
BUN: 10 mg/dL (ref 7–25)
CO2: 24 mmol/L (ref 20–32)
Calcium: 8.8 mg/dL (ref 8.6–10.4)
Chloride: 108 mmol/L (ref 98–110)
Creat: 0.7 mg/dL (ref 0.50–1.05)
GLUCOSE: 95 mg/dL (ref 65–99)
POTASSIUM: 4 mmol/L (ref 3.5–5.3)
Sodium: 141 mmol/L (ref 135–146)

## 2016-09-20 LAB — HEMOGLOBIN A1C
Hgb A1c MFr Bld: 5.3 % (ref <5.7)
Mean Plasma Glucose: 105 mg/dL

## 2016-09-20 LAB — TSH: TSH: 0.73 m[IU]/L

## 2016-09-21 ENCOUNTER — Ambulatory Visit (INDEPENDENT_AMBULATORY_CARE_PROVIDER_SITE_OTHER): Payer: BLUE CROSS/BLUE SHIELD | Admitting: Family Medicine

## 2016-09-21 ENCOUNTER — Encounter: Payer: Self-pay | Admitting: Family Medicine

## 2016-09-21 VITALS — BP 120/80 | HR 77 | Temp 98.6°F | Resp 16 | Ht 65.0 in | Wt 166.2 lb

## 2016-09-21 DIAGNOSIS — E663 Overweight: Secondary | ICD-10-CM | POA: Diagnosis not present

## 2016-09-21 DIAGNOSIS — Z23 Encounter for immunization: Secondary | ICD-10-CM | POA: Diagnosis not present

## 2016-09-21 DIAGNOSIS — E785 Hyperlipidemia, unspecified: Secondary | ICD-10-CM | POA: Diagnosis not present

## 2016-09-21 DIAGNOSIS — E038 Other specified hypothyroidism: Secondary | ICD-10-CM | POA: Diagnosis not present

## 2016-09-21 DIAGNOSIS — I10 Essential (primary) hypertension: Secondary | ICD-10-CM

## 2016-09-21 DIAGNOSIS — J3089 Other allergic rhinitis: Secondary | ICD-10-CM | POA: Diagnosis not present

## 2016-09-21 MED ORDER — ERGOCALCIFEROL 1.25 MG (50000 UT) PO CAPS: 50000.0000 [IU] | ORAL_CAPSULE | ORAL | 1 refills | Status: DC

## 2016-09-21 NOTE — Patient Instructions (Addendum)
F/U in 12 months, call if you need me before  Return 3 stool cards next week for cancer screening      Flu vaccine today   It is important that you exercise regularly at least 30 minutes 5 times a week. If you develop chest pain, have severe difficulty breathing, or feel very tired, stop exercising immediately and seek medical attention   CONGRATS on improved blood sugar , now entirely normal  Congrats on normal excellent blood pressure and  7 pound weight loss   Please work on good  health habits so that your health will improve. 1. Commitment to daily physical activity for 30 to 60  minutes, if you are able to do this.  2. Commitment to wise food choices. Aim for half of your  food intake to be vegetable and fruit, one quarter starchy foods, and one quarter protein. Try to eat on a regular schedule  3 meals per day, snacking between meals should be limited to vegetables or fruits or small portions of nuts. 64 ounces of water per day is generally recommended, unless you have specific health conditions, like heart failure or kidney failure where you will need to limit fluid intake.  3. Commitment to sufficient and a  good quality of physical and mental rest daily, generally between 6 to 8 hours per day.  WITH PERSISTANCE AND PERSEVERANCE, THE IMPOSSIBLE , BECOMES THE NORM!

## 2016-09-22 ENCOUNTER — Ambulatory Visit: Payer: BLUE CROSS/BLUE SHIELD | Admitting: Family Medicine

## 2016-09-22 LAB — LIPID PANEL
Cholesterol: 196 mg/dL (ref <200)
HDL: 73 mg/dL (ref >50)
LDL CALC: 114 mg/dL — AB (ref <100)
Total CHOL/HDL Ratio: 2.7 Ratio (ref <5.0)
Triglycerides: 44 mg/dL (ref <150)
VLDL: 9 mg/dL (ref <30)

## 2016-09-23 ENCOUNTER — Encounter (INDEPENDENT_AMBULATORY_CARE_PROVIDER_SITE_OTHER): Payer: Self-pay | Admitting: *Deleted

## 2016-09-23 NOTE — Assessment & Plan Note (Signed)
Hyperlipidemia:Low fat diet discussed and encouraged.   Lipid Panel  Lab Results  Component Value Date   CHOL 196 09/19/2016   HDL 73 09/19/2016   LDLCALC 114 (H) 09/19/2016   TRIG 44 09/19/2016   CHOLHDL 2.7 09/19/2016   Improving, still not at goal however

## 2016-09-23 NOTE — Assessment & Plan Note (Signed)
Controlled, no change in medication  

## 2016-09-23 NOTE — Assessment & Plan Note (Signed)
Improved Patient re-educated about  the importance of commitment to a  minimum of 150 minutes of exercise per week.  The importance of healthy food choices with portion control discussed. Encouraged to start a food diary, count calories and to consider  joining a support group. Sample diet sheets offered. Goals set by the patient for the next several months.   Weight /BMI 09/21/2016 02/26/2016 08/10/2015  WEIGHT 166 lb 4 oz 169 lb 171 lb  HEIGHT 5\' 5"  5\' 5"  5\' 5"   BMI 27.67 kg/m2 28.12 kg/m2 28.46 kg/m2

## 2016-09-23 NOTE — Assessment & Plan Note (Signed)
Controlled, no change in medication DASH diet and commitment to daily physical activity for a minimum of 30 minutes discussed and encouraged, as a part of hypertension management. The importance of attaining a healthy weight is also discussed.  BP/Weight 09/21/2016 02/26/2016 08/10/2015 06/20/2015 03/31/2015 10/23/2014 10/04/147  Systolic BP 969 249 324 199 144 458 483  Diastolic BP 80 80 80 65 80 80 80  Wt. (Lbs) 166.25 169 171 178 170 173 178.08  BMI 27.67 28.12 28.46 29.62 28.29 28.79 29.63

## 2016-09-23 NOTE — Progress Notes (Signed)
Gail Cox     MRN: [id number]      DOB: [birth year]   HPI Gail Cox is here for follow up and re-evaluation of chronic medical conditions, medication management and review of any available recent lab and radiology data.  Preventive health is updated, specifically  Cancer screening and Immunization.   Questions or concerns regarding consultations or procedures which the PT has had in the interim are  addressed. The PT denies any adverse reactions to current medications since the last visit.  There are no new concerns.  There are no specific complaints   ROS Denies recent fever or chills. Denies sinus pressure, nasal congestion, ear pain or sore throat. Denies chest congestion, productive cough or wheezing. Denies chest pains, palpitations and leg swelling Denies abdominal pain, nausea, vomiting,diarrhea or constipation.   Denies dysuria, frequency, hesitancy or incontinence. Denies joint pain, swelling and limitation in mobility. Denies headaches, seizures, numbness, or tingling. Denies depression, anxiety or insomnia. Denies skin break down or rash.   PE  BP 120/80 (BP Location: Right Arm, Patient Position: Sitting, Cuff Size: Normal)   Pulse 77   Temp 98.6 F (37 C) (Other (Comment))   Resp 16   Ht 5\' 5"  (1.651 m)   Wt 166 lb 4 oz (75.4 kg)   SpO2 98%   BMI 27.67 kg/m   Patient alert and oriented and in no cardiopulmonary distress.  HEENT: No facial asymmetry, EOMI,   oropharynx pink and moist.  Neck supple no JVD, no mass.  Chest: Clear to auscultation bilaterally.  CVS: S1, S2 no murmurs, no S3.Regular rate.  ABD: Soft non tender.   Ext: No edema  MS: Adequate ROM spine, shoulders, hips and knees.  Skin: Intact, no ulcerations or rash noted.  Psych: Good eye contact, normal affect. Memory intact not anxious or depressed appearing.  CNS: CN 2-12 intact, power,  normal throughout.no focal deficits noted.   Assessment & Plan  Essential  hypertension Controlled, no change in medication DASH diet and commitment to daily physical activity for a minimum of 30 minutes discussed and encouraged, as a part of hypertension management. The importance of attaining a healthy weight is also discussed.  BP/Weight 09/21/2016 02/26/2016 08/10/2015 06/20/2015 03/31/2015 10/23/2014 1/47/8295  Systolic BP 621 308 657 846 962 952 841  Diastolic BP 80 80 80 65 80 80 80  Wt. (Lbs) 166.25 169 171 178 170 173 178.08  BMI 27.67 28.12 28.46 29.62 28.29 28.79 29.63       Hyperlipidemia LDL goal <100 Hyperlipidemia:Low fat diet discussed and encouraged.   Lipid Panel  Lab Results  Component Value Date   CHOL 196 09/19/2016   HDL 73 09/19/2016   LDLCALC 114 (H) 09/19/2016   TRIG 44 09/19/2016   CHOLHDL 2.7 09/19/2016   Improving, still not at goal however    Hypothyroidism Controlled, no change in medication   Overweight (BMI 25.0-29.9) Improved Patient re-educated about  the importance of commitment to a  minimum of 150 minutes of exercise per week.  The importance of healthy food choices with portion control discussed. Encouraged to start a food diary, count calories and to consider  joining a support group. Sample diet sheets offered. Goals set by the patient for the next several months.   Weight /BMI 09/21/2016 02/26/2016 08/10/2015  WEIGHT 166 lb 4 oz 169 lb 171 lb  HEIGHT 5\' 5"  5\' 5"  5\' 5"   BMI 27.67 kg/m2 28.12 kg/m2 28.46 kg/m2      Seasonal allergies  Controlled, no change in medication

## 2016-10-06 ENCOUNTER — Encounter (INDEPENDENT_AMBULATORY_CARE_PROVIDER_SITE_OTHER): Payer: Self-pay | Admitting: *Deleted

## 2016-10-06 ENCOUNTER — Telehealth (INDEPENDENT_AMBULATORY_CARE_PROVIDER_SITE_OTHER): Payer: Self-pay | Admitting: *Deleted

## 2016-10-06 NOTE — Telephone Encounter (Signed)
Patient needs trilyte 

## 2016-10-07 MED ORDER — PEG 3350-KCL-NA BICARB-NACL 420 G PO SOLR: 4000.0000 mL | Freq: Once | ORAL | 0 refills | Status: AC

## 2016-10-10 ENCOUNTER — Other Ambulatory Visit (INDEPENDENT_AMBULATORY_CARE_PROVIDER_SITE_OTHER): Payer: Self-pay | Admitting: *Deleted

## 2016-10-10 DIAGNOSIS — Z800 Family history of malignant neoplasm of digestive organs: Secondary | ICD-10-CM

## 2016-10-19 ENCOUNTER — Telehealth (INDEPENDENT_AMBULATORY_CARE_PROVIDER_SITE_OTHER): Payer: Self-pay | Admitting: *Deleted

## 2016-10-19 NOTE — Telephone Encounter (Signed)
Referring MD/PCP: simspon   Procedure: tcs  Reason/Indication:  fam hx colon ca  Has patient had this procedure before?  Yes, 2013  If so, when, by whom and where?    Is there a family history of colon cancer?  Yes, grandfather, uncles & aunts  Who?  What age when diagnosed?    Is patient diabetic?   no      Does patient have prosthetic heart valve or mechanical valve?  no  Do you have a pacemaker?  no  Has patient ever had endocarditis? no  Has patient had joint replacement within last 12 months?  no  Does patient tend to be constipated or take laxatives? yes  Does patient have a history of alcohol/drug use?  no  Is patient on Coumadin, Plavix and/or Aspirin? no  Medications: see epic  Allergies: see epic  Medication Adjustment per Dr Laural Golden:   Procedure date & time: 11/11/16 at 1155

## 2016-10-19 NOTE — Telephone Encounter (Signed)
agree

## 2016-11-11 ENCOUNTER — Ambulatory Visit (HOSPITAL_COMMUNITY)
Admission: RE | Admit: 2016-11-11 | Discharge: 2016-11-11 | Disposition: A | Discharge status: Home / Self Care | Payer: BLUE CROSS/BLUE SHIELD | Source: Ambulatory Visit | Attending: Internal Medicine | Admitting: Internal Medicine

## 2016-11-11 ENCOUNTER — Other Ambulatory Visit: Payer: Self-pay | Admitting: Family Medicine

## 2016-11-11 ENCOUNTER — Encounter (HOSPITAL_COMMUNITY)
Admission: RE | Disposition: A | Discharge status: Home / Self Care | Payer: Self-pay | Source: Ambulatory Visit | Attending: Internal Medicine

## 2016-11-11 ENCOUNTER — Encounter (HOSPITAL_COMMUNITY): Payer: Self-pay | Admitting: *Deleted

## 2016-11-11 DIAGNOSIS — Z888 Allergy status to other drugs, medicaments and biological substances status: Secondary | ICD-10-CM | POA: Diagnosis not present

## 2016-11-11 DIAGNOSIS — K648 Other hemorrhoids: Secondary | ICD-10-CM | POA: Insufficient documentation

## 2016-11-11 DIAGNOSIS — Z800 Family history of malignant neoplasm of digestive organs: Secondary | ICD-10-CM

## 2016-11-11 DIAGNOSIS — Z1211 Encounter for screening for malignant neoplasm of colon: Secondary | ICD-10-CM | POA: Insufficient documentation

## 2016-11-11 DIAGNOSIS — Z8371 Family history of colonic polyps: Secondary | ICD-10-CM | POA: Diagnosis not present

## 2016-11-11 DIAGNOSIS — K5730 Diverticulosis of large intestine without perforation or abscess without bleeding: Secondary | ICD-10-CM

## 2016-11-11 DIAGNOSIS — K921 Melena: Secondary | ICD-10-CM | POA: Insufficient documentation

## 2016-11-11 DIAGNOSIS — Z79899 Other long term (current) drug therapy: Secondary | ICD-10-CM | POA: Insufficient documentation

## 2016-11-11 DIAGNOSIS — Q438 Other specified congenital malformations of intestine: Secondary | ICD-10-CM | POA: Insufficient documentation

## 2016-11-11 DIAGNOSIS — E039 Hypothyroidism, unspecified: Secondary | ICD-10-CM | POA: Diagnosis not present

## 2016-11-11 DIAGNOSIS — Z885 Allergy status to narcotic agent status: Secondary | ICD-10-CM | POA: Insufficient documentation

## 2016-11-11 HISTORY — PX: COLONOSCOPY: SHX5424

## 2016-11-11 SURGERY — COLONOSCOPY
Anesthesia: Moderate Sedation

## 2016-11-11 MED ORDER — SODIUM CHLORIDE 0.9 % IV SOLN
INTRAVENOUS | Status: DC
  Administered 2016-11-11: 1000 mL via INTRAVENOUS

## 2016-11-11 MED ORDER — MIDAZOLAM HCL 5 MG/5ML IJ SOLN
INTRAMUSCULAR | Status: DC | PRN
  Administered 2016-11-11 (×5): 2 mg via INTRAVENOUS

## 2016-11-11 MED ORDER — MEPERIDINE HCL 50 MG/ML IJ SOLN
INTRAMUSCULAR | Status: AC
  Filled 2016-11-11: qty 1

## 2016-11-11 MED ORDER — MEPERIDINE HCL 50 MG/ML IJ SOLN
INTRAMUSCULAR | Status: DC | PRN
  Administered 2016-11-11 (×3): 25 mg via INTRAVENOUS

## 2016-11-11 MED ORDER — MIDAZOLAM HCL 5 MG/5ML IJ SOLN
INTRAMUSCULAR | Status: AC
  Filled 2016-11-11: qty 10

## 2016-11-11 MED ORDER — STERILE WATER FOR IRRIGATION IR SOLN
Status: DC | PRN
  Administered 2016-11-11: 16:00:00

## 2016-11-11 NOTE — H&P (Signed)
Gail Cox is an 51 y.o. female.   Chief Complaint: patient is here for colonoscopy.Gail Cox HPI: patient's 51 year old African-American female who is here for screening colonoscopy. She denies abdominal pain change in bowel habits or rectal bleeding. She got sick last evening when she was taking the pre For she vomited clear fluid within it was blood-tinged. She talk with Dr. [name] were asked and not to drink prep until this morning. She took rest of the prep this morning and did not have any problems. She has  Experienced melena since then. Family history signifcant for CRC in paternal grandfather 2 paternal uncles and a paternal aunt. Her half-brother has had colonic polyps.  Past Medical History:  Diagnosis Date  . Allergic rhinitis, seasonal   . Hypothyroidism   . Migraine headache     Past Surgical History:  Procedure Laterality Date  . CHOLECYSTECTOMY  1999  . COLONOSCOPY  09/21/2011   Procedure: COLONOSCOPY;  Surgeon: Rogene Houston, MD;  [facility];  Service: Endoscopy;  Laterality: N/A;  830  . CYSTECTOMY  1977   Right arm  . CYSTECTOMY  2007   Left breast  . OVARIAN CYST SURGERY  2003   Removed and uterus  . SPINE SURGERY  2016   cervical spine  . THYROIDECTOMY  2006    Family History  Problem Relation Age of Onset  . Lung cancer Mother   . Diabetes Father   . Coronary artery disease Maternal Grandmother    Social History:  reports that she has never smoked. She has never used smokeless tobacco. She reports that she does not drink alcohol or use drugs.  Allergies:  Allergies  Allergen Reactions  . Hydrocodone-Acetaminophen     REACTION: nausea  . Tyloxapol Nausea And Vomiting    Medications Prior to Admission  Medication Sig Dispense Refill  . amLODipine (NORVASC) 5 MG tablet Take 1 tablet (5 mg total) by mouth daily. 90 tablet 1  . Biotin 5000 MCG CAPS Take 10,000 mcg by mouth daily.    . diphenhydramine-acetaminophen (TYLENOL PM) 25-500 MG TABS  Take 2 tablets by mouth at bedtime as needed (achiness or pain). For migraine     . ergocalciferol (VITAMIN D2) 50000 units capsule Take 1 capsule (50,000 Units total) by mouth once a week. One capsule once weekly 12 capsule 1  . SYNTHROID 125 MCG tablet TAKE ONE TABLET BY MOUTH ONCE DAILY (Patient taking differently: TAKE ONE/HALF TABLET (62.5 MCG) BY MOUTH ON TUESDAY, THURSDAY, SATURDAY AND ONE TABLET (125 MCG) ON MONDAY, WEDNESDAY, FRIDAY, SUNDAY ONCE DAILY.) 90 tablet 1  . azelastine (ASTELIN) 0.1 % nasal spray Place 2 sprays into both nostrils 2 (two) times daily. Use in each nostril as directed 30 mL 12    No results found for this or any previous visit (from the past 48 hour(s)). No results found.  ROS  Blood pressure 135/80, pulse 74, temperature 98 F (36.7 C), temperature source Oral, resp. rate 16, height 5\' 5"  (1.651 m), weight 160 lb (72.6 kg), SpO2 99 %. Physical Exam  Constitutional: She appears well-developed and well-nourished.  HENT:  Mouth/Throat: Oropharynx is clear and moist.  Eyes: Conjunctivae are normal. No scleral icterus.  Neck: No thyromegaly present.  Cardiovascular: Normal rate, regular rhythm and normal heart sounds.   No murmur heard. GI: Soft. She exhibits no distension and no mass. There is no tenderness.  Musculoskeletal: She exhibits no edema.  Lymphadenopathy:    She has no cervical adenopathy.  Skin: Skin is warm and dry.     Assessment/Plan High risk screening colonoscopy. Multiple second-degree urinalysis with colon carcinoma.  Hildred Laser, MD 11/11/2016, 4:15 PM

## 2016-11-11 NOTE — Discharge Instructions (Signed)
Resume usual medications and diet. No driving for 24 hours. Next screening colonoscopy in 5 years.   Colonoscopy, Adult, Care After This sheet gives you information about how to care for yourself after your procedure. Your health care provider may also give you more specific instructions. If you have problems or questions, contact your health care provider. What can I expect after the procedure? After the procedure, it is common to have:  A small amount of blood in your stool for 24 hours after the procedure.  Some gas.  Mild abdominal cramping or bloating.  Follow these instructions at home: General instructions   For the first 24 hours after the procedure: ? Do not drive or use machinery. ? Do not sign important documents. ? Do not drink alcohol. ? Do your daily activities at a slower pace than normal. ? Rest often.  Take over-the-counter or prescription medicines only as told by your health care provider.   Drink enough fluid to keep your urine clear or pale yellow.  Resume your normal diet as instructed by your health care provider. Avoid heavy or fried foods that are hard to digest.  Avoid drinking alcohol for as long as instructed by your health care provider. Contact a health care provider if:  You have blood in your stool 2-3 days after the procedure. Get help right away if:  You have more than a small spotting of blood in your stool.  You pass large blood clots in your stool.  Your abdomen is swollen.  You have nausea or vomiting.  You have a fever.  You have increasing abdominal pain that is not relieved with medicine. This information is not intended to replace advice given to you by your health care provider. Make sure you discuss any questions you have with your health care provider. Document Released: 09/01/2003 Document Revised: 10/12/2015 Document Reviewed: 03/31/2015 Elsevier Interactive Patient Education  [employer].

## 2016-11-11 NOTE — Op Note (Signed)
[facility] Patient Name: Gail Cox Procedure Date: 11/11/2016 4:14 PM MRN: [id number] Date of Birth: [birth year] Attending MD: Hildred Laser , MD CSN: [id number] Age: 51 Admit Type: Outpatient Procedure:                Colonoscopy Indications:              Colon cancer screening in patient at increased                            risk: Family history of colorectal cancer in                            multiple 2nd degree relatives Providers:                Hildred Laser, MD, Otis Peak B. Sharon Seller, RN, Randa Spike, Technician Referring MD:             Norwood Levo. Moshe Cipro, MD Medicines:                Meperidine 75 mg IV, Midazolam 10 mg IV Complications:            No immediate complications. Estimated Blood Loss:     Estimated blood loss: none. Procedure:                Pre-Anesthesia Assessment:                           - Prior to the procedure, a History and Physical                            was performed, and patient medications and                            allergies were reviewed. The patient's tolerance of                            previous anesthesia was also reviewed. The risks                            and benefits of the procedure and the sedation                            options and risks were discussed with the patient.                            All questions were answered, and informed consent                            was obtained. Prior Anticoagulants: The patient has                            taken no previous anticoagulant or antiplatelet  agents. ASA Grade Assessment: I - A normal, healthy                            patient. After reviewing the risks and benefits,                            the patient was deemed in satisfactory condition to                            undergo the procedure.                           After obtaining informed consent, the colonoscope                            was  passed under direct vision. Throughout the                            procedure, the patient's blood pressure, pulse, and                            oxygen saturations were monitored continuously. The                            [id number] ([id number]) was introduced through the                            anus and advanced to the the cecum, identified by                            appendiceal orifice and ileocecal valve. The                            colonoscopy was somewhat difficult due to                            significant looping. The patient tolerated the                            procedure well. The quality of the bowel                            preparation was excellent. The ileocecal valve,                            appendiceal orifice, and rectum were photographed. Scope In: 4:28:32 PM Scope Out: 4:59:50 PM Scope Withdrawal Time: 0 hours 6 minutes 5 seconds  Total Procedure Duration: 0 hours 31 minutes 18 seconds  Findings:      The perianal and digital rectal examinations were normal.      A single small-mouthed diverticulum was found in the splenic flexure.      The exam was otherwise without abnormality.      Internal hemorrhoids were found during retroflexion. The hemorrhoids       were small. Impression:               -  Diverticulosis at the splenic flexure.                           - The examination was otherwise normal.                           - Internal hemorrhoids.                           - No specimens collected. Moderate Sedation:      Moderate (conscious) sedation was administered by the endoscopy nurse       and supervised by the endoscopist. The following parameters were       monitored: oxygen saturation, heart rate, blood pressure, CO2       capnography and response to care. Total physician intraservice time was       39 minutes. Recommendation:           - Patient has a contact number available for                            emergencies. The signs  and symptoms of potential                            delayed complications were discussed with the                            patient. Return to normal activities tomorrow.                            Written discharge instructions were provided to the                            patient.                           - Resume previous diet today.                           - Continue present medications.                           - Repeat colonoscopy in 5 years for screening                            purposes. Procedure Code(s):        --- Professional ---                           [phone], Colonoscopy, flexible; diagnostic, including                            collection of specimen(s) by brushing or washing,                            when performed (separate procedure)  27517, Moderate sedation services provided by the                            same physician or other qualified health care                            professional performing the diagnostic or                            therapeutic service that the sedation supports,                            requiring the presence of an independent trained                            observer to assist in the monitoring of the                            patient's level of consciousness and physiological                            status; initial 15 minutes of intraservice time,                            patient age 31 years or older                           [phone], Moderate sedation services; each additional                            15 minutes intraservice time                           99153, Moderate sedation services; each additional                            15 minutes intraservice time Diagnosis Code(s):        --- Professional ---                           Z80.0, Family history of malignant neoplasm of                            digestive organs                           K64.8, Other hemorrhoids                            K57.30, Diverticulosis of large intestine without                            perforation or abscess without bleeding CPT copyright 2016 American Medical Association. All rights reserved. The codes documented in this report are preliminary and upon coder review may  be revised to meet current compliance requirements. [employer]  Laural Golden, MD Hildred Laser, MD 11/11/2016 5:08:36 PM This report has been signed electronically. Number of Addenda: 0

## 2016-11-14 ENCOUNTER — Other Ambulatory Visit: Payer: Self-pay

## 2016-11-14 NOTE — Telephone Encounter (Signed)
Seen 8 [phone] 18

## 2016-11-16 ENCOUNTER — Encounter (HOSPITAL_COMMUNITY): Payer: Self-pay | Admitting: Internal Medicine

## 2016-12-21 ENCOUNTER — Other Ambulatory Visit: Payer: Self-pay | Admitting: Family Medicine

## 2016-12-29 ENCOUNTER — Telehealth: Payer: Self-pay | Admitting: Family Medicine

## 2016-12-29 DIAGNOSIS — Z01818 Encounter for other preprocedural examination: Secondary | ICD-10-CM

## 2016-12-29 DIAGNOSIS — E038 Other specified hypothyroidism: Secondary | ICD-10-CM

## 2016-12-29 DIAGNOSIS — I10 Essential (primary) hypertension: Secondary | ICD-10-CM

## 2016-12-29 NOTE — Telephone Encounter (Signed)
I am really not understyanding why speaking with me the pt thinks will change schedule availablity

## 2016-12-29 NOTE — Telephone Encounter (Signed)
Patient left 2 voicemails requesting to schedule an appointment for surgical clearance. I called her back to schedule, we do not have any 40 min appt time slots in December however brandi said we can schedule her in a spot as long as I dont go over 10 patients am/pm. I attempted to discuss possible appointment options with patient and she said that what I have isnt going to work for her, she says she needs something on her lunch break. I explained that the availability isnt very flexible and she requested to speak directly to Kayenta. [phone]

## 2016-12-30 ENCOUNTER — Other Ambulatory Visit: Payer: Self-pay | Admitting: Family Medicine

## 2016-12-30 DIAGNOSIS — I10 Essential (primary) hypertension: Secondary | ICD-10-CM

## 2016-12-30 DIAGNOSIS — Z01811 Encounter for preprocedural respiratory examination: Secondary | ICD-10-CM

## 2016-12-30 NOTE — Telephone Encounter (Signed)
I am suggesting Dec 13 come for 11:30 am. I have already ordered the CXR.for the Saturday before pls have her get blood work and cCUA at the lab the Saturday before , fasting, let her  drink water please, CBC, chem 7  And tSH  Plas calll and let her know and add her to the schedule at that time also , thanks

## 2016-12-30 NOTE — Telephone Encounter (Signed)
Orders sent and Dusty to schedule

## 2016-12-30 NOTE — Addendum Note (Signed)
Addended by: Eual Fines on: 12/30/2016 12:07 PM   Modules accepted: Orders

## 2017-01-05 LAB — CBC
HCT: 38 % (ref 35.0–45.0)
Hemoglobin: 13.1 g/dL (ref 11.7–15.5)
MCH: 29.5 pg (ref 27.0–33.0)
MCHC: 34.5 g/dL (ref 32.0–36.0)
MCV: 85.6 fL (ref 80.0–100.0)
MPV: 10.9 fL (ref 7.5–12.5)
PLATELETS: 332 10*3/uL (ref 140–400)
RBC: 4.44 10*6/uL (ref 3.80–5.10)
RDW: 12.8 % (ref 11.0–15.0)
WBC: 4.6 10*3/uL (ref 3.8–10.8)

## 2017-01-05 LAB — BASIC METABOLIC PANEL
BUN: 10 mg/dL (ref 7–25)
CHLORIDE: 104 mmol/L (ref 98–110)
CO2: 26 mmol/L (ref 20–32)
Calcium: 9.6 mg/dL (ref 8.6–10.4)
Creat: 0.82 mg/dL (ref 0.50–1.05)
GLUCOSE: 94 mg/dL (ref 65–139)
POTASSIUM: 3.6 mmol/L (ref 3.5–5.3)
Sodium: 139 mmol/L (ref 135–146)

## 2017-01-05 LAB — TSH: TSH: 0.96 mIU/L

## 2017-01-06 ENCOUNTER — Ambulatory Visit (HOSPITAL_COMMUNITY)
Admission: RE | Admit: 2017-01-06 | Discharge: 2017-01-06 | Disposition: A | Discharge status: Home / Self Care | Payer: BLUE CROSS/BLUE SHIELD | Source: Ambulatory Visit | Attending: Family Medicine | Admitting: Family Medicine

## 2017-01-06 DIAGNOSIS — I10 Essential (primary) hypertension: Secondary | ICD-10-CM | POA: Diagnosis not present

## 2017-01-06 DIAGNOSIS — Z01810 Encounter for preprocedural cardiovascular examination: Secondary | ICD-10-CM | POA: Insufficient documentation

## 2017-01-06 DIAGNOSIS — Z01811 Encounter for preprocedural respiratory examination: Secondary | ICD-10-CM

## 2017-01-12 ENCOUNTER — Other Ambulatory Visit: Payer: Self-pay

## 2017-01-12 ENCOUNTER — Ambulatory Visit (INDEPENDENT_AMBULATORY_CARE_PROVIDER_SITE_OTHER): Payer: BLUE CROSS/BLUE SHIELD | Admitting: Family Medicine

## 2017-01-12 ENCOUNTER — Encounter: Payer: Self-pay | Admitting: Family Medicine

## 2017-01-12 VITALS — BP 120/78 | HR 78 | Resp 16 | Ht 65.0 in | Wt 160.0 lb

## 2017-01-12 DIAGNOSIS — E038 Other specified hypothyroidism: Secondary | ICD-10-CM

## 2017-01-12 DIAGNOSIS — R9431 Abnormal electrocardiogram [ECG] [EKG]: Secondary | ICD-10-CM | POA: Diagnosis not present

## 2017-01-12 DIAGNOSIS — Z01818 Encounter for other preprocedural examination: Secondary | ICD-10-CM | POA: Diagnosis not present

## 2017-01-12 DIAGNOSIS — I10 Essential (primary) hypertension: Secondary | ICD-10-CM | POA: Diagnosis not present

## 2017-01-12 LAB — POCT URINALYSIS DIPSTICK
Blood, UA: NEGATIVE
GLUCOSE UA: NEGATIVE
Nitrite, UA: NEGATIVE
Odor: NORMAL
Protein, UA: NEGATIVE
Urobilinogen, UA: 0.2 E.U./dL
pH, UA: 5.5 (ref 5.0–8.0)

## 2017-01-12 NOTE — Patient Instructions (Signed)
F/u in 6 months, we will mail appointment.   we will contact you re cardiology appointment ,I will personally d what I can to get asap appt  [phone] is your cell to call    Be careful l on the road

## 2017-01-12 NOTE — Progress Notes (Signed)
   Gail Cox     MRN: [id number]      DOB: December 18, 1965   HPI Ms. Hoffert is here for clearance for surgery on her left knee, whhich she hopes to get done before the endo of the year since her deductible will have been met by that time She has no complaints pertaining to symptoms suggestive of poor health , and remains active , as much as her knees allow. She does report heart disease in a grandmother , unsure of exactly what this is Her only medical complaint is of bilateral knee pain, left worse than right, and the proposed procedure is on the left  ROS Denies recent fever or chills. Denies sinus pressure, nasal congestion, ear pain or sore throat. Denies chest congestion, productive cough or wheezing. Denies chest pains, palpitations and leg swelling Denies abdominal pain, nausea, vomiting,diarrhea or constipation.   Denies dysuria, frequency, hesitancy or incontinence.  Denies headaches, seizures, numbness, or tingling. Denies depression, anxiety or insomnia. Denies skin break down or rash.   PE  BP 120/78   Pulse 78   Resp 16   Ht '5\' 5"'$  (1.651 m)   Wt 160 lb (72.6 kg)   SpO2 98%   BMI 26.63 kg/m   Patient alert and oriented and in no cardiopulmonary distress.  HEENT: No facial asymmetry, EOMI,   oropharynx pink and moist.  Neck supple no JVD, no mass.  Chest: Clear to auscultation bilaterally. No rep[roducible chest wall pain CXR: clear lung fields and normal heart size  CVS: S1, S2 no murmurs, no S3.Regular rate. EKG: sinus rhythm, no LVH, prolonged QT , no t wave inversion ABD: Soft non tender.   Ext: No edema  Urinalysis: Negative for infection or blood  MS: Adequate ROM spine, shoulders, hips and reduced in both  Knees.left more so than right  Skin: Intact, no ulcerations or rash noted.  Psych: Good eye contact, normal affect. Memory intact not anxious or depressed appearing.  CNS: CN 2-12 intact, power,  normal throughout.no focal deficits  noted.   Assessment & Plan  Pre-op evaluation Pre op evaluation: history negative for infection or symptoms suggestive of heart disease Physical exam , lab tests which include blood and urine are normal. CXR is normal with clear lung fields and a normal heart size. EKG shows a prolonged QT interval and is otherwise normal, I am asking the cardiologist to review this prior to clearing her for knee surgery , and she is aware She does report that she had a grandmother who had some type ogf hear disease, unsure exactly what it was or at what age she presented  Hypothyroidism Controlled, no change in medication   Essential hypertension Controlled, no change in medication

## 2017-01-16 ENCOUNTER — Encounter: Payer: Self-pay | Admitting: Family Medicine

## 2017-01-16 DIAGNOSIS — Z01818 Encounter for other preprocedural examination: Secondary | ICD-10-CM | POA: Insufficient documentation

## 2017-01-16 NOTE — Assessment & Plan Note (Signed)
Pre op evaluation: history negative for infection or symptoms suggestive of heart disease Physical exam , lab tests which include blood and urine are normal. CXR is normal with clear lung fields and a normal heart size. EKG shows a prolonged QT interval and is otherwise normal, I am asking the cardiologist to review this prior to clearing her for knee surgery , and she is aware She does report that she had a grandmother who had some type ogf hear disease, unsure exactly what it was or at what age she presented

## 2017-01-16 NOTE — Assessment & Plan Note (Signed)
Controlled, no change in medication  

## 2017-01-17 ENCOUNTER — Encounter: Payer: Self-pay | Admitting: Cardiovascular Disease

## 2017-01-17 ENCOUNTER — Ambulatory Visit (INDEPENDENT_AMBULATORY_CARE_PROVIDER_SITE_OTHER): Payer: BLUE CROSS/BLUE SHIELD | Admitting: Cardiovascular Disease

## 2017-01-17 DIAGNOSIS — I10 Essential (primary) hypertension: Secondary | ICD-10-CM | POA: Diagnosis not present

## 2017-01-17 DIAGNOSIS — Z01818 Encounter for other preprocedural examination: Secondary | ICD-10-CM | POA: Diagnosis not present

## 2017-01-17 NOTE — Assessment & Plan Note (Signed)
Gail Cox is referred by Dr. Moshe Cipro for preoperative clearance prior to elective total knee replacement by Dr. Mardelle Matte. Her only risk factors treated hyper tension. She's never had a heart attack or stroke. She denies chest pain or shortness of breath. She does not smoke. There is no family history. She'll be clear to low risk for upcoming orthopedic procedure. Her EKG shows a right ventricle conduction delay.

## 2017-01-17 NOTE — Patient Instructions (Signed)
Medication Instructions: Your physician recommends that you continue on your current medications as directed. Please refer to the Current Medication list given to you today.   Follow-Up: You have been cleared for your surgery. We will send a clearance letter. Follow-up with Dr. Gwenlyn Found as needed.

## 2017-01-17 NOTE — Assessment & Plan Note (Signed)
History of essential hypertension blood pressure measured at 120/82. She is on amlodipine. Continue current meds at current dosing

## 2017-01-17 NOTE — Progress Notes (Signed)
01/17/2017 Gail Cox   October 24, 1965  [id number]  Primary Physician Fayrene Helper, MD Primary Cardiologist: Lorretta Harp MD Lupe Carney, [place]  HPI:  Gail Cox is a 51 y.o. 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 She is referred by Dr. Moshe Cipro for cardiovascular clearance before elective total knee replacement by Dr. Mardelle Matte. Risk factors only include true hyper-tension. She does not smoke. There is no history of diabetes. There is no family history. She's never had a heart attack or stroke. She denies chest pain or shortness of breath. She does have an RV conduction delay on her 12-lead EKG.    Current Meds  Medication Sig  . amLODipine (NORVASC) 5 MG tablet TAKE 1 TABLET BY MOUTH ONCE DAILY  . azelastine (ASTELIN) 0.1 % nasal spray Place 2 sprays into both nostrils 2 (two) times daily. Use in each nostril as directed  . Biotin 5000 MCG CAPS Take 10,000 mcg by mouth daily.  . diphenhydramine-acetaminophen (TYLENOL PM) 25-500 MG TABS Take 2 tablets by mouth at bedtime as needed (achiness or pain). For migraine   . ergocalciferol (VITAMIN D2) 50000 units capsule Take 1 capsule (50,000 Units total) by mouth once a week. One capsule once weekly  . SYNTHROID 125 MCG tablet TAKE 1 TABLET BY MOUTH ONCE DAILY     Allergies  Allergen Reactions  . Hydrocodone-Acetaminophen     REACTION: nausea  . Tyloxapol Nausea And Vomiting    Social History   Socioeconomic History  . Marital status: Single    Spouse name: Not on file  . Number of children: Not on file  . Years of education: Not on file  . Highest education level: Not on file  Social Needs  . Financial resource strain: Not on file  . Food insecurity - worry: Not on file  . Food insecurity - inability: Not on file  . Transportation needs - medical: Not on file  . Transportation needs - non-medical: Not on file  Occupational History  . Occupation: Employed   Employer: [employer]  Tobacco Use  . Smoking status: Never Smoker  . Smokeless tobacco: Never Used  Substance and Sexual Activity  . Alcohol use: No  . Drug use: No  . Sexual activity: Not Currently    Birth control/protection: Surgical  Other Topics Concern  . Not on file  Social History Narrative   No children     Review of Systems: General: negative for chills, fever, night sweats or weight changes.  Cardiovascular: negative for chest pain, dyspnea on exertion, edema, orthopnea, palpitations, paroxysmal nocturnal dyspnea or shortness of breath Dermatological: negative for rash Respiratory: negative for cough or wheezing Urologic: negative for hematuria Abdominal: negative for nausea, vomiting, diarrhea, bright red blood per rectum, melena, or hematemesis Neurologic: negative for visual changes, syncope, or dizziness All other systems reviewed and are otherwise negative except as noted above.    Blood pressure 120/82, pulse 70, height 5\' 5"  (1.651 m), weight 160 lb (72.6 kg).  General appearance: alert and no distress Neck: no adenopathy, no carotid bruit, no JVD, supple, symmetrical, trachea midline and thyroid not enlarged, symmetric, no tenderness/mass/nodules Lungs: clear to auscultation bilaterally Heart: regular rate and rhythm, S1, S2 normal, no murmur, click, rub or gallop Extremities: extremities normal, atraumatic, no cyanosis or edema Pulses: 2+ and symmetric Skin: Skin color, texture, turgor normal. No rashes or lesions Neurologic: Alert and oriented X 3, normal strength and tone. Normal symmetric  reflexes. Normal coordination and gait  EKG sinus rhythm at 70 with RSR prime in lead V1 and V2 consistent with an RV conduction delay. I personally reviewed this EKG.  ASSESSMENT AND PLAN:   Essential hypertension History of essential hypertension blood pressure measured at 120/82. She is on amlodipine. Continue current meds at current dosing  Pre-op  evaluation [name] is referred by Dr. Moshe Cipro for preoperative clearance prior to elective total knee replacement by Dr. Mardelle Matte. Her only risk factors treated hyper tension. She's never had a heart attack or stroke. She denies chest pain or shortness of breath. She does not smoke. There is no family history. She'll be clear to low risk for upcoming orthopedic procedure. Her EKG shows a right ventricle conduction delay.      Lorretta Harp MD FACP,FACC,FAHA, [facility] 01/17/2017 2:32 PM

## 2017-04-20 ENCOUNTER — Other Ambulatory Visit: Payer: Self-pay | Admitting: Family Medicine

## 2017-04-20 DIAGNOSIS — Z1231 Encounter for screening mammogram for malignant neoplasm of breast: Secondary | ICD-10-CM

## 2017-05-31 ENCOUNTER — Ambulatory Visit
Admission: RE | Admit: 2017-05-31 | Discharge: 2017-05-31 | Disposition: A | Discharge status: Home / Self Care | Payer: BLUE CROSS/BLUE SHIELD | Source: Ambulatory Visit | Attending: Family Medicine | Admitting: Family Medicine

## 2017-05-31 DIAGNOSIS — Z1231 Encounter for screening mammogram for malignant neoplasm of breast: Secondary | ICD-10-CM

## 2017-06-13 ENCOUNTER — Ambulatory Visit: Payer: BLUE CROSS/BLUE SHIELD

## 2017-06-30 ENCOUNTER — Other Ambulatory Visit: Payer: Self-pay | Admitting: Family Medicine

## 2017-07-05 ENCOUNTER — Other Ambulatory Visit: Payer: Self-pay | Admitting: Family Medicine

## 2017-07-22 ENCOUNTER — Emergency Department (HOSPITAL_COMMUNITY)
Admission: EM | Admit: 2017-07-22 | Discharge: 2017-07-22 | Disposition: A | Discharge status: Home / Self Care | Payer: Managed Care, Other (non HMO) | Attending: Emergency Medicine | Admitting: Emergency Medicine

## 2017-07-22 ENCOUNTER — Other Ambulatory Visit: Payer: Self-pay

## 2017-07-22 ENCOUNTER — Encounter (HOSPITAL_COMMUNITY): Payer: Self-pay | Admitting: Emergency Medicine

## 2017-07-22 ENCOUNTER — Emergency Department (HOSPITAL_COMMUNITY): Payer: Managed Care, Other (non HMO)

## 2017-07-22 DIAGNOSIS — R079 Chest pain, unspecified: Secondary | ICD-10-CM | POA: Diagnosis present

## 2017-07-22 DIAGNOSIS — Z79899 Other long term (current) drug therapy: Secondary | ICD-10-CM | POA: Diagnosis not present

## 2017-07-22 DIAGNOSIS — E039 Hypothyroidism, unspecified: Secondary | ICD-10-CM | POA: Insufficient documentation

## 2017-07-22 DIAGNOSIS — Z041 Encounter for examination and observation following transport accident: Secondary | ICD-10-CM | POA: Insufficient documentation

## 2017-07-22 DIAGNOSIS — J45909 Unspecified asthma, uncomplicated: Secondary | ICD-10-CM | POA: Insufficient documentation

## 2017-07-22 HISTORY — DX: Unspecified asthma, uncomplicated: J45.909

## 2017-07-22 MED ORDER — IBUPROFEN 800 MG PO TABS: 800.0000 mg | ORAL_TABLET | Freq: Three times a day (TID) | ORAL | 0 refills | Status: DC | PRN

## 2017-07-22 NOTE — Discharge Instructions (Addendum)
Follow-up with your family doctor if any problems 

## 2017-07-22 NOTE — ED Notes (Signed)
EDP at bedside updating patient. 
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
## 2017-09-15 ENCOUNTER — Telehealth: Payer: Self-pay

## 2017-09-15 DIAGNOSIS — R7302 Impaired glucose tolerance (oral): Secondary | ICD-10-CM

## 2017-09-15 DIAGNOSIS — E785 Hyperlipidemia, unspecified: Secondary | ICD-10-CM

## 2017-09-15 DIAGNOSIS — I10 Essential (primary) hypertension: Secondary | ICD-10-CM

## 2017-09-15 NOTE — Telephone Encounter (Signed)
Labs ordered.

## 2017-09-18 LAB — COMPLETE METABOLIC PANEL WITH GFR
AG Ratio: 2.4 (calc) (ref 1.0–2.5)
ALKALINE PHOSPHATASE (APISO): 66 U/L (ref 33–130)
ALT: 11 U/L (ref 6–29)
AST: 20 U/L (ref 10–35)
Albumin: 4.4 g/dL (ref 3.6–5.1)
BILIRUBIN TOTAL: 1 mg/dL (ref 0.2–1.2)
BUN: 11 mg/dL (ref 7–25)
CO2: 27 mmol/L (ref 20–32)
Calcium: 9.2 mg/dL (ref 8.6–10.4)
Chloride: 107 mmol/L (ref 98–110)
Creat: 0.77 mg/dL (ref 0.50–1.05)
GFR, Est African American: 102 mL/min/{1.73_m2} (ref >=60)
GFR, Est Non African American: 88 mL/min/{1.73_m2} (ref >=60)
GLUCOSE: 88 mg/dL (ref 65–99)
Globulin: 1.8 g/dL (calc) — ABNORMAL LOW (ref 1.9–3.7)
Potassium: 4.1 mmol/L (ref 3.5–5.3)
Sodium: 140 mmol/L (ref 135–146)
Total Protein: 6.2 g/dL (ref 6.1–8.1)

## 2017-09-18 LAB — LIPID PANEL
CHOL/HDL RATIO: 3.1 (calc) (ref <5.0)
CHOLESTEROL: 200 mg/dL — AB (ref <200)
HDL: 64 mg/dL (ref >50)
LDL CHOLESTEROL (CALC): 121 mg/dL — AB
Non-HDL Cholesterol (Calc): 136 mg/dL (calc) — ABNORMAL HIGH (ref <130)
TRIGLYCERIDES: 61 mg/dL (ref <150)

## 2017-09-18 LAB — HEMOGLOBIN A1C
Hgb A1c MFr Bld: 5.4 %{Hb}
Mean Plasma Glucose: 108 (calc)
eAG (mmol/L): 6 (calc)

## 2017-09-18 LAB — CBC
HCT: 39.1 % (ref 35.0–45.0)
Hemoglobin: 12.7 g/dL (ref 11.7–15.5)
MCH: 28.3 pg (ref 27.0–33.0)
MCHC: 32.5 g/dL (ref 32.0–36.0)
MCV: 87.3 fL (ref 80.0–100.0)
MPV: 11.6 fL (ref 7.5–12.5)
Platelets: 316 10*3/uL (ref 140–400)
RBC: 4.48 Million/uL (ref 3.80–5.10)
RDW: 13 % (ref 11.0–15.0)
WBC: 4.5 10*3/uL (ref 3.8–10.8)

## 2017-09-18 LAB — TSH: TSH: 0.32 mIU/L — ABNORMAL LOW

## 2017-09-18 LAB — VITAMIN D 25 HYDROXY (VIT D DEFICIENCY, FRACTURES): Vit D, 25-Hydroxy: 26 ng/mL — ABNORMAL LOW (ref 30–100)

## 2017-09-20 ENCOUNTER — Encounter: Payer: Self-pay | Admitting: Family Medicine

## 2017-09-20 ENCOUNTER — Ambulatory Visit (INDEPENDENT_AMBULATORY_CARE_PROVIDER_SITE_OTHER): Payer: Managed Care, Other (non HMO) | Admitting: Family Medicine

## 2017-09-20 VITALS — BP 138/82 | HR 63 | Resp 16 | Ht 65.0 in | Wt 169.0 lb

## 2017-09-20 DIAGNOSIS — I10 Essential (primary) hypertension: Secondary | ICD-10-CM

## 2017-09-20 DIAGNOSIS — E038 Other specified hypothyroidism: Secondary | ICD-10-CM

## 2017-09-20 DIAGNOSIS — G4700 Insomnia, unspecified: Secondary | ICD-10-CM | POA: Insufficient documentation

## 2017-09-20 DIAGNOSIS — E785 Hyperlipidemia, unspecified: Secondary | ICD-10-CM

## 2017-09-20 DIAGNOSIS — Z23 Encounter for immunization: Secondary | ICD-10-CM

## 2017-09-20 DIAGNOSIS — G4709 Other insomnia: Secondary | ICD-10-CM | POA: Diagnosis not present

## 2017-09-20 DIAGNOSIS — E663 Overweight: Secondary | ICD-10-CM

## 2017-09-20 DIAGNOSIS — J4520 Mild intermittent asthma, uncomplicated: Secondary | ICD-10-CM

## 2017-09-20 DIAGNOSIS — F321 Major depressive disorder, single episode, moderate: Secondary | ICD-10-CM | POA: Insufficient documentation

## 2017-09-20 DIAGNOSIS — Z1211 Encounter for screening for malignant neoplasm of colon: Secondary | ICD-10-CM

## 2017-09-20 MED ORDER — ALBUTEROL SULFATE HFA 108 (90 BASE) MCG/ACT IN AERS: 2.0000 | INHALATION_SPRAY | Freq: Four times a day (QID) | RESPIRATORY_TRACT | 4 refills | Status: DC | PRN

## 2017-09-20 MED ORDER — LEVOTHYROXINE SODIUM 100 MCG PO TABS
100.0000 ug | ORAL_TABLET | Freq: Every day | ORAL | 3 refills | Status: DC
Start: 2017-09-20 — End: 2018-05-31

## 2017-09-20 MED ORDER — TEMAZEPAM 7.5 MG PO CAPS
7.5000 mg | ORAL_CAPSULE | Freq: Every evening | ORAL | 3 refills | Status: DC | PRN
Start: 2017-09-20 — End: 2017-11-08

## 2017-09-20 NOTE — Assessment & Plan Note (Signed)
Mild ad intermittent, needs refill on rescue

## 2017-09-20 NOTE — Patient Instructions (Addendum)
F/u in early January, call if you need me before  Flu vaccine today  New LOWER dose of synthroid is 100 mcg EVERY DAY  New to help with sleep is restoril 1 as needed  Please reduce fat in your diet  Blood pressure above goal, losing weight will lower your blood pressure  TSH and cologuard test first week in November  Call therapy to de stress when needed

## 2017-09-20 NOTE — Assessment & Plan Note (Signed)
After obtaining informed consent, the vaccine is  administered by LPN.  

## 2017-09-20 NOTE — Assessment & Plan Note (Signed)
Deteriorated. Patient re-educated about  the importance of commitment to a  minimum of 150 minutes of exercise per week.  The importance of healthy food choices with portion control discussed. Encouraged to start a food diary, count calories and to consider  joining a support group. Sample diet sheets offered. Goals set by the patient for the next several months.   Weight /BMI 09/20/2017 07/22/2017 01/17/2017  WEIGHT 169 lb 160 lb 160 lb  HEIGHT 5\' 5"  5\' 5"  5\' 5"   BMI 28.12 kg/m2 26.63 kg/m2 26.63 kg/m2

## 2017-09-20 NOTE — Assessment & Plan Note (Addendum)
Over corrected reduce to 100 daily, rept end November

## 2017-09-20 NOTE — Assessment & Plan Note (Signed)
Not suicidal or homicidal Start Restoril 7.5 mg at bedtime ,  Pt states she will get therapy through her insurance company directly

## 2017-09-20 NOTE — Assessment & Plan Note (Signed)
Hyperlipidemia:Low fat diet discussed and encouraged.   Lipid Panel  Lab Results  Component Value Date   CHOL 200 (H) 09/15/2017   HDL 64 09/15/2017   LDLCALC 121 (H) 09/15/2017   TRIG 61 09/15/2017   CHOLHDL 3.1 09/15/2017   Needs to lower fat intake , uncontrolled, no medication at this tme '
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
## 2017-09-20 NOTE — Assessment & Plan Note (Signed)
Not at goal, no med change , pt to work on lifestyle , DASH diet and commitment to daily physical activity for a minimum of 30 minutes discussed and encouraged, as a part of hypertension management. The importance of attaining a healthy weight is also discussed.  BP/Weight 09/20/2017 07/22/2017 01/17/2017 01/12/2017 11/11/2016 09/21/2016 3/84/6659  Systolic BP 935 701 779 390 300 923 300  Diastolic BP 82 70 82 78 67 80 80  Wt. (Lbs) 169 160 160 160 160 166.25 169  BMI 28.12 26.63 26.63 26.63 26.63 27.67 28.12

## 2017-10-20 ENCOUNTER — Other Ambulatory Visit: Payer: Self-pay | Admitting: Family Medicine

## 2017-10-20 ENCOUNTER — Telehealth: Payer: Self-pay

## 2017-10-20 NOTE — Telephone Encounter (Signed)
Primary Cardiologist: [facility] Pre-operative Risk Assessment    Request for surgical clearance:  1. What type of surgery is being performed? Right partial knee replacement   2. When is this surgery scheduled? 01/18/2018   3. What type of clearance is required (medical clearance vs. Pharmacy clearance to hold med vs. Both)? Medical  4. Are there any medications that need to be held prior to surgery and how long? Not listed   5. Practice name and name of physician performing surgery? [facility]   6. What is your office phone number [phone]    7.   What is your office fax number [phone]  8.   Anesthesia type (None, local, MAC, general) ? Not listed   Lamar Laundry 10/20/2017, 3:45 PM  _________________________________________________________________   (provider comments below)

## 2017-10-23 ENCOUNTER — Telehealth: Payer: Self-pay | Admitting: Cardiovascular Disease

## 2017-10-23 NOTE — Telephone Encounter (Signed)
Follow Up:     Patient is calling for results of the surgical clearance
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
## 2017-10-24 NOTE — Telephone Encounter (Signed)
Pt is scheduled to see Kerin Ransom PA on 11/08/17. Clearance will be addressed at visit

## 2017-10-24 NOTE — Telephone Encounter (Signed)
Pt is scheduled to see Kerin Ransom on 11/08/17. Clearance will be addressed at visit. I have notified requesting office.

## 2017-10-26 LAB — COLOGUARD: COLOGUARD: NEGATIVE

## 2017-11-08 ENCOUNTER — Ambulatory Visit (INDEPENDENT_AMBULATORY_CARE_PROVIDER_SITE_OTHER): Payer: Managed Care, Other (non HMO) | Admitting: Cardiology

## 2017-11-08 ENCOUNTER — Encounter: Payer: Self-pay | Admitting: Cardiology

## 2017-11-08 VITALS — BP 140/88 | HR 68 | Ht 65.0 in | Wt 170.2 lb

## 2017-11-08 DIAGNOSIS — J4520 Mild intermittent asthma, uncomplicated: Secondary | ICD-10-CM

## 2017-11-08 DIAGNOSIS — I10 Essential (primary) hypertension: Secondary | ICD-10-CM

## 2017-11-08 DIAGNOSIS — E785 Hyperlipidemia, unspecified: Secondary | ICD-10-CM

## 2017-11-08 DIAGNOSIS — Z01818 Encounter for other preprocedural examination: Secondary | ICD-10-CM | POA: Insufficient documentation

## 2017-11-08 DIAGNOSIS — R9431 Abnormal electrocardiogram [ECG] [EKG]: Secondary | ICD-10-CM | POA: Diagnosis not present

## 2017-11-08 NOTE — Assessment & Plan Note (Signed)
Repeat by me 130/80-she has not taken her medications yet

## 2017-11-08 NOTE — Patient Instructions (Signed)
Your physician recommends that you schedule a follow-up appointment in: As Needed    

## 2017-11-08 NOTE — Assessment & Plan Note (Signed)
LDL 121 Aug 2019- being followed by Dr Moshe Cipro

## 2017-11-08 NOTE — Assessment & Plan Note (Signed)
Seasonal allergies/asthma

## 2017-11-08 NOTE — Assessment & Plan Note (Signed)
Pt seen by Dr Domenic Polite in 2012 for chest pain- normal stress echo then Seen by Dr Gwenlyn Found Dec 2018 for an abnormal EKG- RV conduction delay- reassured Seen today for pre op clearance, EKG May 2019-prolonged QTC- not seen today.
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
## 2017-11-08 NOTE — Assessment & Plan Note (Signed)
Seen today for pre op clearance-RTKR

## 2017-12-04 ENCOUNTER — Other Ambulatory Visit: Payer: Self-pay

## 2017-12-04 ENCOUNTER — Emergency Department (HOSPITAL_COMMUNITY)
Admission: EM | Admit: 2017-12-04 | Discharge: 2017-12-04 | Disposition: A | Discharge status: Home / Self Care | Payer: Managed Care, Other (non HMO) | Attending: Emergency Medicine | Admitting: Emergency Medicine

## 2017-12-04 ENCOUNTER — Encounter (HOSPITAL_COMMUNITY): Payer: Self-pay | Admitting: Emergency Medicine

## 2017-12-04 DIAGNOSIS — E039 Hypothyroidism, unspecified: Secondary | ICD-10-CM | POA: Insufficient documentation

## 2017-12-04 DIAGNOSIS — J45909 Unspecified asthma, uncomplicated: Secondary | ICD-10-CM | POA: Insufficient documentation

## 2017-12-04 DIAGNOSIS — M545 Low back pain, unspecified: Secondary | ICD-10-CM

## 2017-12-04 DIAGNOSIS — Z79899 Other long term (current) drug therapy: Secondary | ICD-10-CM | POA: Insufficient documentation

## 2017-12-04 MED ORDER — KETOROLAC TROMETHAMINE 30 MG/ML IJ SOLN
30.0000 mg | Freq: Once | INTRAMUSCULAR | Status: AC
  Administered 2017-12-04: 30 mg via INTRAMUSCULAR
  Filled 2017-12-04: qty 1

## 2017-12-04 MED ORDER — IBUPROFEN 600 MG PO TABS: 600.0000 mg | ORAL_TABLET | Freq: Four times a day (QID) | ORAL | 0 refills | Status: DC | PRN

## 2017-12-04 MED ORDER — METHOCARBAMOL 500 MG PO TABS: 500.0000 mg | ORAL_TABLET | Freq: Two times a day (BID) | ORAL | 0 refills | Status: DC

## 2017-12-04 NOTE — Discharge Instructions (Signed)
Take ibuprofen every 6 hours as needed for your pain.  You can alternate with Tylenol as prescribed over-the-counter.  Take Robaxin twice daily as needed for muscle pain or spasms.  Use heat and ice alternating 20 minutes on, 20 minutes off.  Please follow-up with your orthopedic doctor for further management.  Please return the emergency department if you develop any new or worsening symptoms including complete numbness in your legs or groin, loss of bowel or bladder control, or any other concerning symptoms.

## 2017-12-04 NOTE — ED Provider Notes (Signed)
[facility] EMERGENCY DEPARTMENT Provider Note   CSN: [id number] Arrival date & time: 12/04/17  1059     History   Chief Complaint Chief Complaint  Patient presents with  . Back Pain    HPI Gail Cox is a 52 y.o. female with history of hypothyroidism, migraine, asthma who presents with a 1 day history of right low back pain radiating into the right hip and around the leg.  Patient reports her pain began suddenly when she got up from a sitting position to go to the bathroom.  She reports she has had shooting pain from her right low back around towards her knee.  She denies any numbness or tingling.  She took Tylenol at home without significant relief.  She has no history of back pain.  She denies any urinary symptoms, saddle anesthesia, loss of bowel or bladder control, fevers, history of procedure to back, history of IVDU or known cancer.  She is currently being treated by orthopedic doctor for right knee pain and is planning to have a knee replacement.  She wears a brace to her right knee and admits to limping.  HPI  Past Medical History:  Diagnosis Date  . Allergic rhinitis, seasonal   . Asthma   . Hypothyroidism   . Migraine headache     Patient Active Problem List   Diagnosis Date Noted  . Pre-operative clearance 11/08/2017  . Abnormal EKG 11/08/2017  . Insomnia 09/20/2017  . Depression, major, single episode, moderate ([place]) 09/20/2017  . Family history of colon cancer 10/10/2016  . IGT (impaired glucose tolerance) 08/10/2015  . Hyperlipidemia LDL goal <100 02/11/2014  . Need for influenza vaccination 11/18/2013  . Essential hypertension 07/01/2013  . Osteoarthritis of both knees 09/20/2012  . Asthma in adult 02/27/2012  . Seasonal allergies 02/27/2012  . Overweight (BMI 25.0-29.9) 11/05/2011  . Hypothyroidism 06/06/2007  . Migraine with aura 06/06/2007  . ALLERGIC RHINITIS, SEASONAL 06/06/2007    Past Surgical History:  Procedure Laterality Date  . BREAST  CYST EXCISION Left   . CHOLECYSTECTOMY  1999  . COLONOSCOPY  09/21/2011   Procedure: COLONOSCOPY;  Surgeon: Rogene Houston, MD;  [facility]: [facility];  Service: Endoscopy;  Laterality: N/A;  830  . COLONOSCOPY N/A 11/11/2016   Procedure: COLONOSCOPY;  Surgeon: Rogene Houston, MD;  Location: [facility];  Service: Endoscopy;  Laterality: N/A;  1155  . CYSTECTOMY  1977   Right arm  . CYSTECTOMY  2007   Left breast  . OVARIAN CYST SURGERY  2003   Removed and uterus  . SPINE SURGERY  2016   cervical spine  . THYROIDECTOMY  2006     OB History   None      Home Medications    Prior to Admission medications   Medication Sig Start Date End Date Taking? Authorizing Provider  albuterol (PROVENTIL HFA;VENTOLIN HFA) 108 (90 Base) MCG/ACT inhaler Inhale 2 puffs into the lungs every 6 (six) hours as needed for wheezing or shortness of breath. 09/20/17 09/21/18  Fayrene Helper, MD  amLODipine (NORVASC) 5 MG tablet TAKE 1 TABLET BY MOUTH ONCE DAILY 07/04/17   Fayrene Helper, MD  Biotin 5000 MCG CAPS Take 10,000 mcg by mouth daily.    [provider]  diphenhydrAMINE-APAP, sleep, (TYLENOL PM EXTRA STRENGTH PO) Take 2 tablets by mouth at bedtime as needed.    [provider]  ibuprofen (ADVIL,MOTRIN) 600 MG tablet Take 1 tablet (600 mg total) by mouth  every 6 (six) hours as needed. 12/04/17   Searra Carnathan, Bea Graff, PA-C  levothyroxine (SYNTHROID, LEVOTHROID) 100 MCG tablet Take 1 tablet (100 mcg total) by mouth daily. 09/20/17   Fayrene Helper, MD  methocarbamol (ROBAXIN) 500 MG tablet Take 1 tablet (500 mg total) by mouth 2 (two) times daily. 12/04/17   Frederica Kuster, PA-C    Family History Family History  Problem Relation Age of Onset  . Lung cancer Mother   . Diabetes Father   . Coronary artery disease Maternal Grandmother   . Breast cancer Neg Hx     Social History Social History   Tobacco Use  . Smoking status: Never Smoker  . Smokeless tobacco:  Never Used  Substance Use Topics  . Alcohol use: No  . Drug use: No     Allergies   Hydrocodone-acetaminophen and Tyloxapol   Review of Systems Review of Systems  Constitutional: Negative for fever.  Genitourinary: Negative for dysuria and frequency.  Musculoskeletal: Positive for back pain.  Neurological: Negative for numbness.     Physical Exam Updated Vital Signs BP (!) 151/81 (BP Location: Right Arm)   Pulse 94   Temp 97.8 F (36.6 C) (Oral)   Resp 18   Ht 5\' 5"  (1.651 m)   Wt 77.1 kg   SpO2 100%   BMI 28.29 kg/m   Physical Exam  Constitutional: She appears well-developed and well-nourished. No distress.  HENT:  Head: Normocephalic and atraumatic.  Mouth/Throat: Oropharynx is clear and moist. No oropharyngeal exudate.  Eyes: Pupils are equal, round, and reactive to light. Conjunctivae are normal. Right eye exhibits no discharge. Left eye exhibits no discharge. No scleral icterus.  Neck: Normal range of motion. Neck supple. No thyromegaly present.  Cardiovascular: Normal rate, regular rhythm, normal heart sounds and intact distal pulses. Exam reveals no gallop and no friction rub.  No murmur heard. Pulmonary/Chest: Effort normal and breath sounds normal. No stridor. No respiratory distress. She has no wheezes. She has no rales.  Abdominal: Soft. Bowel sounds are normal. She exhibits no distension. There is no tenderness. There is no rebound and no guarding.  Musculoskeletal: She exhibits no edema.  No midline cervical, thoracic, or lumbar tenderness No tenderness to the paraspinal muscles or gluteal muscles, patient is holding her right low back and hip; my palpation does not reproduce the pain Straight leg raise exacerbates pain on the right  Lymphadenopathy:    She has no cervical adenopathy.  Neurological: She is alert. Coordination normal.  5/5 strength to bilateral lower extremities with normal sensation  Skin: Skin is warm and dry. No rash noted. She is  not diaphoretic. No pallor.  Psychiatric: She has a normal mood and affect.  Nursing note and vitals reviewed.    ED Treatments / Results  Labs (all labs ordered are listed, but only abnormal results are displayed) Labs Reviewed - No data to display  EKG None  Radiology No results found.  Procedures Procedures (including critical care time)  Medications Ordered in ED Medications  ketorolac (TORADOL) 30 MG/ML injection 30 mg (30 mg Intramuscular Given 12/04/17 1142)     Initial Impression / Assessment and Plan / ED Course  I have reviewed the triage vital signs and the nursing notes.  Pertinent labs & imaging results that were available during my care of the patient were reviewed by me and considered in my medical decision making (see chart for details).     Patient with back pain, probable radiculopathy considering  distribution.  Question if related to patient's knee pain and disruption of gait.  As patient was getting dressed, I also noticed she wears a heavy belt with a computer scanner attached to her right hip, as she works for [employer].  No neurological deficits and normal neuro exam.  Patient is ambulatory.  No loss of bowel or bladder control.  No concern for cauda equina.  No fever, night sweats, weight loss, h/o cancer, IVDA, no recent procedure to back. No urinary symptoms suggestive of UTI.  Patient got an appointment with her orthopedic doctor after my evaluation and would like to go directly there.  Supportive care and return precaution discussed.  Patient will be discharged home with ibuprofen and Robaxin.  Given Toradol prior to discharge.  Patient understands and agrees with plan.  Patient vitals stable throughout ED course and discharged in satisfactory condition.   Final Clinical Impressions(s) / ED Diagnoses   Final diagnoses:  Acute right-sided low back pain without sciatica    ED Discharge Orders         Ordered    ibuprofen (ADVIL,MOTRIN) 600 MG tablet   Every 6 hours PRN     12/04/17 1158    methocarbamol (ROBAXIN) 500 MG tablet  2 times daily     12/04/17 [address], [place], [place] 12/04/17 1213    Dorie Rank, MD 12/04/17 1531

## 2017-12-04 NOTE — ED Triage Notes (Signed)
Onset of back pain after getting up and going to the bathroom, pain radiating down right leg. Pt is tearful and in pain in triage.

## 2017-12-07 ENCOUNTER — Encounter: Payer: Self-pay | Admitting: Family Medicine

## 2017-12-07 ENCOUNTER — Ambulatory Visit (INDEPENDENT_AMBULATORY_CARE_PROVIDER_SITE_OTHER): Payer: Managed Care, Other (non HMO) | Admitting: Family Medicine

## 2017-12-07 VITALS — BP 134/86 | HR 85 | Resp 12 | Ht 65.0 in | Wt 166.1 lb

## 2017-12-07 DIAGNOSIS — E038 Other specified hypothyroidism: Secondary | ICD-10-CM

## 2017-12-07 DIAGNOSIS — I10 Essential (primary) hypertension: Secondary | ICD-10-CM | POA: Diagnosis not present

## 2017-12-07 DIAGNOSIS — J4520 Mild intermittent asthma, uncomplicated: Secondary | ICD-10-CM

## 2017-12-07 DIAGNOSIS — M5431 Sciatica, right side: Secondary | ICD-10-CM | POA: Diagnosis not present

## 2017-12-07 DIAGNOSIS — E663 Overweight: Secondary | ICD-10-CM

## 2017-12-07 DIAGNOSIS — J302 Other seasonal allergic rhinitis: Secondary | ICD-10-CM

## 2017-12-07 MED ORDER — METHYLPREDNISOLONE ACETATE 80 MG/ML IJ SUSP
120.0000 mg | Freq: Once | INTRAMUSCULAR | Status: AC
  Administered 2017-12-07: 120 mg via INTRAMUSCULAR

## 2017-12-07 NOTE — Assessment & Plan Note (Signed)
Depo medrol 120 mg IM in office , followed by pred 4 mg dose  pack

## 2017-12-07 NOTE — Progress Notes (Signed)
Gail Cox     MRN: [id number]      DOB: 03-01-1965   HPI Gail Cox is here for follow up of ED visit for acute right sciatica, for the first time ever, no specific aggravating factor, however started ongetting up from the commode.  Pain was in low back, right buttock and right groin, right hip joint to upper third anterior thigh. Saw ortho re the pain and he did an xray and assured her that she has sciatic pain, prednisone 4 mg dose pack was also prescribed. Pain is less but persists.Out of work by Ortho until 12/11/2017 Denies weakness , numbness or new incontinence ROS Denies recent fever or chills. Denies sinus pressure, nasal congestion, ear pain or sore throat. Denies chest congestion, productive cough or wheezing. Denies chest pains, palpitations and leg swelling Denies abdominal pain, nausea, vomiting,diarrhea or constipation.   Denies dysuria, frequency, hesitancy or incontinence.  Denies headaches, seizures, numbness, or tingling. Denies depression, anxiety or insomnia. Denies skin break down or rash.   PE  BP 134/86 (BP Location: Right Arm, Patient Position: Sitting, Cuff Size: Normal)   Pulse 85   Resp 12   Ht 5\' 5"  (1.651 m)   Wt 166 lb 1.3 oz (75.3 kg)   SpO2 98% Comment: room air  BMI 27.64 kg/m   Patient alert and oriented and in no cardiopulmonary distress.Pt in pain  HEENT: No facial asymmetry, EOMI,   oropharynx pink and moist.  Neck supple no JVD, no mass.  Chest: Clear to auscultation bilaterally.  CVS: S1, S2 no murmurs, no S3.Regular rate.  ABD: Soft non tender.   Ext: No edema  MG:[id number]  ROM lumbar spine, adequate in shoulders, hips and knees.  Skin: Intact, no ulcerations or rash noted.  Psych: Good eye contact, normal affect. Memory intact not anxious or depressed appearing.  CNS: CN 2-12 intact, power,  normal throughout.no focal deficits noted.   Assessment & Plan  Sciatica of right side Depo medrol 120 mg IM in office ,  followed by pred 4 mg dose  pack  Seasonal allergies Acute onset unprovoked x 5 days, still very painful, depo medrol 120 mg IM in office , later called back for additional steroid  Hypothyroidism Controlled, no change in medication   Essential hypertension Controlled, no change in medication DASH diet and commitment to daily physical activity for a minimum of 30 minutes discussed and encouraged, as a part of hypertension management. The importance of attaining a healthy weight is also discussed.  BP/Weight 12/07/2017 12/04/2017 11/08/2017 09/20/2017 07/22/2017 01/17/2017 32/67/1245  Systolic BP 809 983 382 505 397 673 419  Diastolic BP 86 81 88 82 70 82 78  Wt. (Lbs) 166.08 170 170.2 169 160 160 160  BMI 27.64 28.29 28.32 28.12 26.63 26.63 26.63       Asthma in adult Controlled on chronic meds , continue same  Overweight (BMI 25.0-29.9) Improved Patient re-educated about  the importance of commitment to a  minimum of 150 minutes of exercise per week.  The importance of healthy food choices with portion control discussed. Encouraged to start a food diary, count calories and to consider  joining a support group. Sample diet sheets offered. Goals set by the patient for the next several months.   Weight /BMI 12/07/2017 12/04/2017 11/08/2017  WEIGHT 166 lb 1.3 oz 170 lb 170 lb 3.2 oz  HEIGHT 5\' 5"  5\' 5"  5\' 5"   BMI 27.64 kg/m2 28.29 kg/m2 28.32 kg/m2

## 2017-12-07 NOTE — Patient Instructions (Signed)
F/U in 5 months, call if you need me before  Depo Medrol 120 mg IM  In office , complete the prednisone dose pack 4 mg   We will find the cologuard test result and get back to you

## 2017-12-08 ENCOUNTER — Telehealth: Payer: Self-pay | Admitting: *Deleted

## 2017-12-08 ENCOUNTER — Other Ambulatory Visit: Payer: Self-pay | Admitting: Family Medicine

## 2017-12-08 MED ORDER — PREDNISONE 20 MG PO TABS: 20.0000 mg | ORAL_TABLET | Freq: Two times a day (BID) | ORAL | 0 refills | Status: DC

## 2017-12-08 NOTE — Telephone Encounter (Signed)
Please advise 

## 2017-12-08 NOTE — Telephone Encounter (Signed)
Dr Moshe Cipro asked if she needed more steroids at appt yesterday pt said no but realized after tomorrow she will be out Wanted to know if more could be called in?

## 2017-12-08 NOTE — Telephone Encounter (Signed)
pls let her knoiw I am prescribing and have sent in additional  Oral prednisone

## 2017-12-09 ENCOUNTER — Encounter: Payer: Self-pay | Admitting: Family Medicine

## 2017-12-09 NOTE — Assessment & Plan Note (Signed)
Controlled, no change in medication  

## 2017-12-09 NOTE — Assessment & Plan Note (Signed)
Controlled, no change in medication DASH diet and commitment to daily physical activity for a minimum of 30 minutes discussed and encouraged, as a part of hypertension management. The importance of attaining a healthy weight is also discussed.  BP/Weight 12/07/2017 12/04/2017 11/08/2017 09/20/2017 07/22/2017 01/17/2017 35/46/5681  Systolic BP 275 170 017 494 496 759 163  Diastolic BP 86 81 88 82 70 82 78  Wt. (Lbs) 166.08 170 170.2 169 160 160 160  BMI 27.64 28.29 28.32 28.12 26.63 26.63 26.63

## 2017-12-09 NOTE — Assessment & Plan Note (Signed)
Improved Patient re-educated about  the importance of commitment to a  minimum of 150 minutes of exercise per week.  The importance of healthy food choices with portion control discussed. Encouraged to start a food diary, count calories and to consider  joining a support group. Sample diet sheets offered. Goals set by the patient for the next several months.   Weight /BMI 12/07/2017 12/04/2017 11/08/2017  WEIGHT 166 lb 1.3 oz 170 lb 170 lb 3.2 oz  HEIGHT 5\' 5"  5\' 5"  5\' 5"   BMI 27.64 kg/m2 28.29 kg/m2 28.32 kg/m2

## 2017-12-09 NOTE — Assessment & Plan Note (Signed)
Acute onset unprovoked x 5 days, still very painful, depo medrol 120 mg IM in office , later called back for additional steroid

## 2017-12-09 NOTE — Assessment & Plan Note (Signed)
Controlled on chronic meds , continue same

## 2017-12-11 ENCOUNTER — Telehealth: Payer: Self-pay | Admitting: *Deleted

## 2017-12-11 NOTE — Telephone Encounter (Signed)
Spoke with patient she is aware

## 2017-12-11 NOTE — Telephone Encounter (Signed)
Marcie Bal from pharmacy called to clarify the prednisone 20 mg due to other medications Call back [phone]

## 2017-12-11 NOTE — Telephone Encounter (Signed)
Please advise 

## 2017-12-11 NOTE — Telephone Encounter (Signed)
Clarified with phaarmacy

## 2017-12-14 ENCOUNTER — Emergency Department (HOSPITAL_COMMUNITY)
Admission: EM | Admit: 2017-12-14 | Discharge: 2017-12-14 | Disposition: A | Discharge status: Home / Self Care | Payer: No Typology Code available for payment source | Attending: Emergency Medicine | Admitting: Emergency Medicine

## 2017-12-14 ENCOUNTER — Encounter (HOSPITAL_COMMUNITY): Payer: Self-pay | Admitting: Emergency Medicine

## 2017-12-14 DIAGNOSIS — Y939 Activity, unspecified: Secondary | ICD-10-CM | POA: Diagnosis not present

## 2017-12-14 DIAGNOSIS — E039 Hypothyroidism, unspecified: Secondary | ICD-10-CM | POA: Diagnosis not present

## 2017-12-14 DIAGNOSIS — Y929 Unspecified place or not applicable: Secondary | ICD-10-CM | POA: Insufficient documentation

## 2017-12-14 DIAGNOSIS — Y999 Unspecified external cause status: Secondary | ICD-10-CM | POA: Insufficient documentation

## 2017-12-14 DIAGNOSIS — S91339A Puncture wound without foreign body, unspecified foot, initial encounter: Secondary | ICD-10-CM | POA: Diagnosis present

## 2017-12-14 DIAGNOSIS — J45909 Unspecified asthma, uncomplicated: Secondary | ICD-10-CM | POA: Insufficient documentation

## 2017-12-14 DIAGNOSIS — Z79899 Other long term (current) drug therapy: Secondary | ICD-10-CM | POA: Diagnosis not present

## 2017-12-14 DIAGNOSIS — W450XXA Nail entering through skin, initial encounter: Secondary | ICD-10-CM | POA: Insufficient documentation

## 2017-12-14 DIAGNOSIS — T148XXA Other injury of unspecified body region, initial encounter: Secondary | ICD-10-CM

## 2017-12-14 MED ORDER — TETANUS-DIPHTH-ACELL PERTUSSIS 5-2.5-18.5 LF-MCG/0.5 IM SUSP
0.5000 mL | Freq: Once | INTRAMUSCULAR | Status: DC
  Filled 2017-12-14: qty 0.5

## 2017-12-14 MED ORDER — CIPROFLOXACIN HCL 500 MG PO TABS: 500.0000 mg | ORAL_TABLET | Freq: Two times a day (BID) | ORAL | 0 refills | Status: DC

## 2017-12-14 NOTE — ED Triage Notes (Signed)
Pt states she stepped on nail today at work, states it puncture her skin but did not have any active bleeding. Pt is also requesting tetanus shot.

## 2017-12-14 NOTE — ED Provider Notes (Signed)
[facility] EMERGENCY DEPARTMENT Provider Note   CSN: [id number] Arrival date & time: 12/14/17  2035     History   Chief Complaint Chief Complaint  Patient presents with  . Foot Pain    HPI Gail Cox is a 52 y.o. female.  Presents to the emergency department with a chief complaint of puncture wound to her foot.  She states that she stepped on a nail earlier today.  There was no bleeding, but it did break the skin.  She is uncertain when her last tetanus shot was, but believes that she needs one.  She reports only minimal pain.  Denies any treatments prior to arrival.  She is not diabetic.  She has no allergies to antibiotics.  The history is provided by the patient. No language interpreter was used.    Past Medical History:  Diagnosis Date  . Allergic rhinitis, seasonal   . Asthma   . Hypothyroidism   . Migraine headache     Patient Active Problem List   Diagnosis Date Noted  . Sciatica of right side 12/07/2017  . Abnormal EKG 11/08/2017  . Insomnia 09/20/2017  . Family history of colon cancer 10/10/2016  . IGT (impaired glucose tolerance) 08/10/2015  . Hyperlipidemia LDL goal <100 02/11/2014  . Essential hypertension 07/01/2013  . Osteoarthritis of both knees 09/20/2012  . Asthma in adult 02/27/2012  . Seasonal allergies 02/27/2012  . Overweight (BMI 25.0-29.9) 11/05/2011  . Hypothyroidism 06/06/2007  . Migraine with aura 06/06/2007  . ALLERGIC RHINITIS, SEASONAL 06/06/2007    Past Surgical History:  Procedure Laterality Date  . BREAST CYST EXCISION Left   . CHOLECYSTECTOMY  1999  . COLONOSCOPY  09/21/2011   Procedure: COLONOSCOPY;  Surgeon: Rogene Houston, MD;  [facility];  Service: Endoscopy;  Laterality: N/A;  830  . COLONOSCOPY N/A 11/11/2016   Procedure: COLONOSCOPY;  Surgeon: Rogene Houston, MD;  [facility];  Service: Endoscopy;  Laterality: N/A;  1155  . CYSTECTOMY  1977   Right arm  . CYSTECTOMY   2007   Left breast  . OVARIAN CYST SURGERY  2003   Removed and uterus  . SPINE SURGERY  2016   cervical spine  . THYROIDECTOMY  2006     OB History   None      Home Medications    Prior to Admission medications   Medication Sig Start Date End Date Taking? Authorizing Provider  albuterol (PROVENTIL HFA;VENTOLIN HFA) 108 (90 Base) MCG/ACT inhaler Inhale 2 puffs into the lungs every 6 (six) hours as needed for wheezing or shortness of breath. 09/20/17 09/21/18  Fayrene Helper, MD  amLODipine (NORVASC) 5 MG tablet TAKE 1 TABLET BY MOUTH ONCE DAILY 07/04/17   Fayrene Helper, MD  Biotin 5000 MCG CAPS Take 10,000 mcg by mouth daily.    [provider]  ciprofloxacin (CIPRO) 500 MG tablet Take 1 tablet (500 mg total) by mouth 2 (two) times daily. 12/14/17   Montine Circle, PA-C  ibuprofen (ADVIL,MOTRIN) 600 MG tablet Take 1 tablet (600 mg total) by mouth every 6 (six) hours as needed. 12/04/17   Law, Bea Graff, PA-C  levothyroxine (SYNTHROID, LEVOTHROID) 100 MCG tablet Take 1 tablet (100 mcg total) by mouth daily. 09/20/17   Fayrene Helper, MD  methocarbamol (ROBAXIN) 500 MG tablet Take 1 tablet (500 mg total) by mouth 2 (two) times daily. 12/04/17   Law, Bea Graff, PA-C  methylPREDNISolone (MEDROL DOSEPAK) 4 MG  TBPK tablet Take 1 tablet by mouth as directed. 12/04/17   [provider]  predniSONE (DELTASONE) 20 MG tablet Take 1 tablet (20 mg total) by mouth 2 (two) times daily with a meal. 12/08/17   Fayrene Helper, MD    Family History Family History  Problem Relation Age of Onset  . Lung cancer Mother   . Diabetes Father   . Coronary artery disease Maternal Grandmother   . Breast cancer Neg Hx     Social History Social History   Tobacco Use  . Smoking status: Never Smoker  . Smokeless tobacco: Never Used  Substance Use Topics  . Alcohol use: No  . Drug use: No     Allergies   Hydrocodone-acetaminophen and Tyloxapol   Review of  Systems Review of Systems  All other systems reviewed and are negative.    Physical Exam Updated Vital Signs BP 133/87 (BP Location: Left Arm)   Pulse 95   Temp 98.5 F (36.9 C) (Oral)   Resp 20   Ht 5\' 5"  (1.651 m)   Wt 77.1 kg   SpO2 98%   BMI 28.29 kg/m   Physical Exam  Constitutional: She is oriented to person, place, and time. No distress.  HENT:  Head: Normocephalic and atraumatic.  Eyes: Pupils are equal, round, and reactive to light. Conjunctivae and EOM are normal.  Neck: No tracheal deviation present.  Cardiovascular: Normal rate.  Pulmonary/Chest: Effort normal. No respiratory distress.  Abdominal: Soft.  Musculoskeletal: Normal range of motion.  Neurological: She is alert and oriented to person, place, and time.  Skin: Skin is warm and dry. She is not diaphoretic.  No visible wound, no palpable foreign body  Psychiatric: Judgment normal.  Nursing note and vitals reviewed.    ED Treatments / Results  Labs (all labs ordered are listed, but only abnormal results are displayed) Labs Reviewed - No data to display  EKG None  Radiology No results found.  Procedures Procedures (including critical care time)  Medications Ordered in ED Medications - No data to display   Initial Impression / Assessment and Plan / ED Course  I have reviewed the triage vital signs and the nursing notes.  Pertinent labs & imaging results that were available during my care of the patient were reviewed by me and considered in my medical decision making (see chart for details).     Shin with reported puncture wound to foot.  Last tetanus shot was on 02/26/2016 per medical records.  Patient is not diabetic.  The nail reportedly penetrated through the sole of the shoe and into the foot, however, I am unable to see a clear wound.  Will cover with cipro for increased risk of pseudomonas exposure.  PCP follow-up.  Final Clinical Impressions(s) / ED Diagnoses   Final diagnoses:    Puncture wound    ED Discharge Orders         Ordered    ciprofloxacin (CIPRO) 500 MG tablet  2 times daily     12/14/17 2210           Montine Circle, PA-C 12/14/17 2214    Lacretia Leigh, MD 12/15/17 1659

## 2017-12-14 NOTE — ED Notes (Signed)
Patient verbalizes understanding of discharge instructions. Opportunity for questioning and answers were provided. Armband removed by staff, pt discharged from ED ambulatory.   

## 2017-12-31 ENCOUNTER — Other Ambulatory Visit: Payer: Self-pay | Admitting: Family Medicine

## 2018-01-01 ENCOUNTER — Other Ambulatory Visit: Payer: Self-pay

## 2018-01-01 ENCOUNTER — Telehealth: Payer: Self-pay | Admitting: Family Medicine

## 2018-01-01 DIAGNOSIS — I10 Essential (primary) hypertension: Secondary | ICD-10-CM

## 2018-01-01 MED ORDER — AMLODIPINE BESYLATE 5 MG PO TABS: 5.0000 mg | ORAL_TABLET | Freq: Every day | ORAL | 3 refills | Status: DC

## 2018-01-01 NOTE — Progress Notes (Signed)
Amlodpine reordered

## 2018-01-01 NOTE — Telephone Encounter (Signed)
Pt needs her Amlodipine called in to [facility] in [place],

## 2018-01-22 ENCOUNTER — Encounter (HOSPITAL_COMMUNITY): Payer: Self-pay

## 2018-01-22 ENCOUNTER — Other Ambulatory Visit: Payer: Self-pay

## 2018-01-22 ENCOUNTER — Ambulatory Visit (HOSPITAL_COMMUNITY): Payer: Managed Care, Other (non HMO) | Attending: Orthopedic Surgery

## 2018-01-22 DIAGNOSIS — M25561 Pain in right knee: Secondary | ICD-10-CM | POA: Insufficient documentation

## 2018-01-22 DIAGNOSIS — M25661 Stiffness of right knee, not elsewhere classified: Secondary | ICD-10-CM

## 2018-01-22 DIAGNOSIS — M6281 Muscle weakness (generalized): Secondary | ICD-10-CM | POA: Insufficient documentation

## 2018-01-22 NOTE — Therapy (Signed)
[place], [place], [address] Phone: [phone]   Fax:  [phone]  Physical Therapy Evaluation  Patient Details  Name: Gail Cox MRN: [id number] Date of Birth: March 27, 1965 Referring Provider (PT): Nestor Ramp, MD   Encounter Date: 01/22/2018  PT End of Session - 01/22/18 1254    Visit Number  1    Number of Visits  19    Date for PT Re-Evaluation  03/12/18   mini-reassess due by 02/26/17   Authorization Type  [employer] Managed (no auth required, no visit limit)    Authorization Time Period  01/22/18 - 03/16/18   pt 1st appointment scheduled 02/06/17   Authorization - Visit Number  1    Authorization - Number of Visits  10    PT Start Time  1120    PT Stop Time  1208    PT Time Calculation (min)  48 min    Activity Tolerance  Patient tolerated treatment well;Patient limited by pain    Behavior During Therapy  [facility] for tasks assessed/performed       Past Medical History:  Diagnosis Date  . Allergic rhinitis, seasonal   . Asthma   . Hypothyroidism   . Migraine headache     Past Surgical History:  Procedure Laterality Date  . BREAST CYST EXCISION Left   . CHOLECYSTECTOMY  1999  . COLONOSCOPY  09/21/2011   Procedure: COLONOSCOPY;  Surgeon: Rogene Houston, MD;  [facility];  Service: Endoscopy;  Laterality: N/A;  830  . COLONOSCOPY N/A 11/11/2016   Procedure: COLONOSCOPY;  Surgeon: Rogene Houston, MD;  [facility];  Service: Endoscopy;  Laterality: N/A;  1155  . CYSTECTOMY  1977   Right arm  . CYSTECTOMY  2007   Left breast  . OVARIAN CYST SURGERY  2003   Removed and uterus  . SPINE SURGERY  2016   cervical spine  . THYROIDECTOMY  2006    There were no vitals filed for this visit.   Subjective Assessment - 01/22/18 1130    Subjective  Patient arrive with surgical dressing on Rt knee, ace wrap around Rt LE ankle to thigh and wearing ted hose on bil LE's. She reports  the MD/surgical staff instructed her to wear the ted hose for 2 weeks. She reports her surgery was on 01/18/18 and that she is managing her pain with prescription pain medication and ice. She states she had 1 bout of emesis on Saturday and feels nauseous today but denies and emesis, diarrhea, or fever since Saturday. She believes it may be a reaction to the pain medication as this has happened before. She reports her Lt knee was replaced last year and she recover in about 5 weeks. She did not have any therapy at that time but had a CPM machine at home and used that every day.     Limitations  House hold activities;Walking;Standing;Lifting    How long can you sit comfortably?  pain is constant    How long can you stand comfortably?  pain is constant    How long can you walk comfortably?  pain is constant    Patient Stated Goals  to improve knee mobility and strength, and reduce pain to return to work    Currently in Pain?  Yes    Pain Score  7     Pain Location  Knee    Pain Orientation  Left    Pain  Descriptors / Indicators  Aching;Sore    Pain Type  Acute pain;Surgical pain    Pain Onset  In the past 7 days    Pain Frequency  Constant    Aggravating Factors   nothing specific    Pain Relieving Factors  ice, elevating    Effect of Pain on Daily Activities  moderate limitation         [facility] PT Assessment - 01/22/18 0001      Assessment   Medical Diagnosis  Rt Partial Knee Replacement    Referring Provider (PT)  Nestor Ramp, MD    Onset Date/Surgical Date  01/18/18    Next MD Visit  02/05/2018      Precautions   Precautions  None      Restrictions   Weight Bearing Restrictions  No      Balance Screen   Has the patient fallen in the past 6 months  No    Has the patient had a decrease in activity level because of a fear of falling?   No    Is the patient reluctant to leave their home because of a fear of falling?   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     Leisure  resting; go out to church on sundays, "other than that I'm a loner       Cognition   Overall Cognitive Status  Within Functional Limits for tasks assessed      Observation/Other Assessments   Skin Integrity  surgical dressing intact, bruising along medial side of dresing is visible, joint is warm compared to contralateral LE but no errythema is present    Focus on Therapeutic Outcomes (FOTO)   68% limited      ROM / Strength   AROM / PROM / Strength  AROM;Strength      AROM   AROM Assessment Site  Knee    Right/Left Knee  Right    Right Knee Extension  8    Right Knee Flexion  50   pt limited by pain     Strength   Strength Assessment Site  Hip;Knee;Ankle    Right Hip Flexion  2+/5   pain limited patient in knee   Right Hip Extension  4/5    Right Hip ABduction  4/5    Left Hip Flexion  4+/5    Left Hip Extension  4/5    Left Hip ABduction  4/5    Right/Left Knee  Right;Left    Right Knee Flexion  2+/5   pain   Right Knee Extension  4-/5  pain   Left Knee Flexion  4+/5    Left Knee Extension  5/5    Right Ankle Dorsiflexion  4/5    Left Ankle Dorsiflexion  5/5       Objective measurements completed on examination: See above findings.     [facility] Adult PT Treatment/Exercise - 01/22/18 0001      Exercises    Exercises  Knee/Hip      Knee/Hip Exercises: Stretches   Knee: Self-Stretch to increase Flexion  4 reps;20 seconds;Right    Knee: Self-Stretch Limitations  seated AAROM for knee flex stretch      Modalities   Modalities  Cryotherapy      Cryotherapy   Number Minutes Cryotherapy  10 Minutes    Cryotherapy Location  Knee    Type of Cryotherapy  Ice pack       PT Education - 01/22/18 1212    Education Details  Educated on appropriate POC and importance of HEP participation. Educated on proper elevation technique for edema management and on ted hose use and when it is appropriate versus not appropriate.     Person(s) Educated  Patient    Methods  Explanation;Handout    Comprehension  Verbalized understanding       PT Short Term Goals - 01/22/18 1303      PT SHORT TERM GOAL #1   Title  Pt will demo understanding of HEP, updatd PRN, to imrpove Rt knee mobility and LE strength to imrpove functional mobility to return to PLOF and work.    Time  2    Period  Weeks    Status  New    Target Date  02/05/18      PT SHORT TERM GOAL #2   Title  Patient will improve ROM to 3-100 degrees to demonstrate significant improvement and to imrpove functional gait and moblity to be able to ascend/descend stairs.    Time  4    Period  Weeks    Status  New    Target Date  02/19/18      PT SHORT TERM GOAL #3   Title  Patient will have reduce edema of Rt knee with circumferential measure no greater than 0.5 cm larger than Lt knee at joint line to improve ROM for knee joint and muscle activation.    Time  4    Period  Weeks    Status  New        PT Long Term Goals - 01/22/18 1304      PT LONG TERM GOAL #1   Title  Patient will achieve 0-120 degrees of knee ROM or equal to Lt knee to imrpove functional symmetry of gait and to be able to ascend/descend stairs with reciprocol step pattern.     Time  8    Period  Weeks    Status  New    Target Date  03/19/18      PT LONG TERM GOAL #2   Title   Patient will have 4/5 or greater for all limited muscle groups on bil LE's to demonstrate improved functional LE strength.    Time  8    Period  Weeks    Status  New      PT LONG TERM GOAL #3   Title  Patient will ambulate at 1.0 m/s with no assistive device to demonstrate safe community ambulation speed and improve balance and WB tolerance ot Rt knee to be able to return to work with decreased  fall risk.    Time  8    Period  Weeks    Status  New      PT LONG TERM GOAL #4   Title  Patient will improve FOTO to 39% or less limitation to indicate significant improvement in function with mobility and daily activities and reduced self reported limitation related to Rt knee pain.     Time  8    Period  Weeks    Status  New         Plan - 01/22/18 1256    Clinical Impression Statement  Ms. Hullum presents for physical therapy evaluation following Rt partial knee replacement. She is 4 days s/p surgery and presents with 7/10 pain. Her Rt knee is swollen and warm to touch, but not excessive erythema is present at this time. She has limited AROM form 8-50 degrees with Rt knee and was limited with Rt LE strength at hip and knee due to pain. Objective measures were limited today due to patient pain and nausea and will be continued at next session. She was educated on and provided initial HEP to perform 2x daily until next therapy session. Ms. Semidey will benefit from skilled PT interventions to address impairments and progress Rt knee mobility to improve functional gait, balance, and mobility to return to work and PLOF.    History and Personal Factors relevant to plan of care:  Rt TKA on 01/18/18    Clinical Presentation  Stable    Clinical Presentation due to:  weakness, limite ROM, pain, impraired gait and balance, FOTO, clinical judgement    Clinical Decision Making  Low    Rehab Potential  Good    PT Frequency  3x / week    PT Duration  6 weeks    PT Treatment/Interventions  ADLs/Self Care  Home Management;Aquatic Therapy;Cryotherapy;Electrical Stimulation;Iontophoresis 4mg /ml Dexamethasone;Moist Heat;DME Instruction;Gait training;Stair training;Functional mobility training;Therapeutic activities;Patient/family education;Therapeutic exercise;Balance training;Neuromuscular re-education;Cognitive remediation;Manual techniques;Passive range of motion;Taping    PT Next Visit Plan  Review eval and goals. Review HEP and assess ROM again. Follow up on elevation and ice use for pain. Initiate ROM based exercises and stretching.    PT Home Exercise Plan  Eval: heel slide, seated knee flexion stretch, quad set    Consulted and Agree with Plan of Care  Patient       Patient will benefit from skilled therapeutic intervention in order to improve the following deficits and impairments:  Abnormal gait, Decreased skin integrity, Pain, Decreased mobility, Decreased scar mobility, Decreased strength, Decreased range of motion, Decreased endurance, Decreased activity tolerance, Decreased balance, Difficulty walking, Increased edema, Impaired flexibility, Hypomobility  Visit Diagnosis: Acute pain of right knee  Stiffness of right knee, not elsewhere classified  Muscle weakness (generalized)     Problem List Patient Active Problem List   Diagnosis Date Noted  . Sciatica of right side 12/07/2017  . Abnormal EKG 11/08/2017  . Insomnia 09/20/2017  . Family history of colon cancer 10/10/2016  . IGT (impaired glucose tolerance) 08/10/2015  . Hyperlipidemia LDL goal <100 02/11/2014  . Essential hypertension 07/01/2013  . Osteoarthritis of both knees 09/20/2012  . Asthma in adult 02/27/2012  . Seasonal allergies 02/27/2012  . Overweight (BMI 25.0-29.9) 11/05/2011  . Hypothyroidism 06/06/2007  . Migraine with aura 06/06/2007  . ALLERGIC RHINITIS, SEASONAL 06/06/2007    Kipp Brood, PT, DPT Physical Therapist with [facility]  01/22/2018 1:12 PM    [facility]  [address] [place], [place], [address] Phone: [phone]   Fax:  [phone]  Name: CYNETHIA SCHINDLER MRN: [id number] Date of Birth: 23-Mar-1965

## 2018-01-22 NOTE — Patient Instructions (Signed)
Step 1  Step 2  Seated Knee Flexion Stretch reps: 10  sets: 3  hold: 30 seconds  daily: 2  weekly: 7 Setup  Begin by sitting upright in a chair with your feet positioned shoulder width apart with one foot forward. Movement  Keeping both feet flat on the floor, move your other foot back until you feel a gentle stretch.  Tip  Make sure to keep your foot on the floor and pointing straight forward. Step 1  Step 2  Step 3  Supine Heel Slides reps: 10  sets: 3  hold: 5 seconds  daily: 2  weekly: 7 Setup  Begin lying on your back with your legs bent and your hands resting on your belly. Movement  Take a deep breath in, as you exhale, tighten your abdominal muscles. Continue to breathe normally as you hold the contraction and slowly slide one leg out straight. Slide back to start position, relax contraction and then repeat with the other leg. Tip  Remember to breathe normally through out this exercise as you keep your abdominals tight. Step 1  Step 2  Supine Quad Set reps: 10  sets: 3  hold: 5 seconds  daily: 2  weekly: 7 Setup  Begin lying on your back with one knee bent and your other leg straight with your knee resting on a towel roll. Movement  Gently squeeze your thigh muscles, pushing the back of your knee down into the towel. Tip  Make sure to keep your back flat against the floor during the exercise.

## 2018-01-26 ENCOUNTER — Encounter (HOSPITAL_COMMUNITY): Payer: Self-pay

## 2018-01-26 ENCOUNTER — Ambulatory Visit (HOSPITAL_COMMUNITY): Payer: Managed Care, Other (non HMO)

## 2018-01-26 DIAGNOSIS — M6281 Muscle weakness (generalized): Secondary | ICD-10-CM

## 2018-01-26 DIAGNOSIS — M25561 Pain in right knee: Secondary | ICD-10-CM | POA: Diagnosis not present

## 2018-01-26 DIAGNOSIS — M25661 Stiffness of right knee, not elsewhere classified: Secondary | ICD-10-CM

## 2018-01-26 NOTE — Therapy (Signed)
[place], [place], [address] Phone: [phone]   Fax:  [phone]  Physical Therapy Treatment  Patient Details  Name: Gail Cox MRN: [id number] Date of Birth: 1965/12/08 Referring Provider (PT): Nestor Ramp, MD   Encounter Date: 01/26/2018  PT End of Session - 01/26/18 1355    Visit Number  2    Number of Visits  19    Date for PT Re-Evaluation  03/12/18   Minireassess 02/26/2018   Authorization Type  [employer] Managed (no auth required, no visit limit)    Authorization Time Period  01/22/18 - 03/16/18    Authorization - Visit Number  2    Authorization - Number of Visits  10    PT Start Time  1350    PT Stop Time  1432    PT Time Calculation (min)  42 min    Activity Tolerance  Patient tolerated treatment well;No increased pain    Behavior During Therapy  WFL for tasks assessed/performed       Past Medical History:  Diagnosis Date  . Allergic rhinitis, seasonal   . Asthma   . Hypothyroidism   . Migraine headache     Past Surgical History:  Procedure Laterality Date  . BREAST CYST EXCISION Left   . CHOLECYSTECTOMY  1999  . COLONOSCOPY  09/21/2011   Procedure: COLONOSCOPY;  Surgeon: Rogene Houston, MD;  [facility];  Service: Endoscopy;  Laterality: N/A;  830  . COLONOSCOPY N/A 11/11/2016   Procedure: COLONOSCOPY;  Surgeon: Rogene Houston, MD;  [facility];  Service: Endoscopy;  Laterality: N/A;  1155  . CYSTECTOMY  1977   Right arm  . CYSTECTOMY  2007   Left breast  . OVARIAN CYST SURGERY  2003   Removed and uterus  . SPINE SURGERY  2016   cervical spine  . THYROIDECTOMY  2006    There were no vitals filed for this visit.  Subjective Assessment - 01/26/18 1352    Subjective  No reports of pain currently, when it is painful all on medial aspects.  Reports she has been applying ice intermittently, not every day though.  Stated she has not been as motivated to  do exercises as she was last year with Lt knee surgery.    Patient Stated Goals  to improve knee mobility and strength, and reduce pain to return to work    Currently in Pain?  No/denies                       [facility] Adult PT Treatment/Exercise - 01/26/18 0001      Knee/Hip Exercises: Stretches   Active Hamstring Stretch  2 reps;30 seconds    Active Hamstring Stretch Limitations  supine     Knee: Self-Stretch to increase Flexion  10 seconds    Knee: Self-Stretch Limitations  knee drive on 8in step 71I 10"    Gastroc Stretch  3 reps;30 seconds    Gastroc Stretch Limitations  slant board      Knee/Hip Exercises: Seated   Heel Slides  10 reps    Heel Slides Limitations  seated AAROM for knee flex stretch      Knee/Hip Exercises: Supine   Quad Sets  10 reps    Quad Sets Limitations  towel underneath knee    Short Arc [employer]  10 reps    Short Arc Quad Sets Limitations  5" holds  Heel Slides  10 reps    Knee Extension  AROM    Knee Extension Limitations  8    Knee Flexion  AROM    Knee Flexion Limitations  70      Manual Therapy   Manual Therapy  Edema management    Manual therapy comments  performed separate rest of session    Edema Management  Retro massage wtih LE elevated             PT Education - 01/26/18 1442    Education Details  Reviewed goals, educated importance of compliance wiht HEP, encouraged to apply ice more regulary to assist iwht edema, pain and mobility.    Person(s) Educated  Patient    Methods  Explanation;Demonstration    Comprehension  Verbalized understanding;Returned demonstration;Verbal cues required       PT Short Term Goals - 01/22/18 1303      PT SHORT TERM GOAL #1   Title  Pt will demo understanding of HEP, updatd PRN, to imrpove Rt knee mobility and LE strength to imrpove functional mobility to return to PLOF and work.    Time  2    Period  Weeks    Status  New    Target Date  02/05/18      PT SHORT TERM GOAL #2    Title  Patient will improve ROM to 3-100 degrees to demonstrate significant improvement and to imrpove functional gait and moblity to be able to ascend/descend stairs.    Time  4    Period  Weeks    Status  New    Target Date  02/19/18      PT SHORT TERM GOAL #3   Title  Patient will have reduce edema of Rt knee with circumferential measure no greater than 0.5 cm larger than Lt knee at joint line to improve ROM for knee joint and muscle activation.    Time  4    Period  Weeks    Status  New        PT Long Term Goals - 01/22/18 1304      PT LONG TERM GOAL #1   Title  Patient will achieve 0-120 degrees of knee ROM or equal to Lt knee to imrpove functional symmetry of gait and to be able to ascend/descend stairs with reciprocol step pattern.     Time  8    Period  Weeks    Status  New    Target Date  03/19/18      PT LONG TERM GOAL #2   Title  Patient will have 4/5 or greater for all limited muscle groups on bil LE's to demonstrate improved functional LE strength.    Time  8    Period  Weeks    Status  New      PT LONG TERM GOAL #3   Title  Patient will ambulate at 1.0 m/s with no assistive device to demonstrate safe community ambulation speed and improve balance and WB tolerance ot Rt knee to be able to return to work with decreased fall risk.    Time  8    Period  Weeks    Status  New      PT LONG TERM GOAL #4   Title  Patient will improve FOTO to 39% or less limitation to indicate significant improvement in function with mobility and daily activities and reduced self reported limitation related to Rt knee pain.     Time  8  Period  Weeks    Status  New            Plan - 01/26/18 1447    Clinical Impression Statement  Reviewed goals and checked compliance wiht HEP.  Pt able to recall seated knee flexion exercises but required education on technique with quad sets.  Pt educated importance of compliance with HEP for maximal benefits and encouraged to increased  frequency.  Pt stated she has not been as motivated to complete HEP as she was last year with Lt knee surgery.  Session focus on knee mobility including quad strengthening exercises, stretches for flexion and extension and manual techniques to address edema present proximal knee.  Added knee drives, SAQ, hamstring and gastroc stretches to POC to address mobility.  Improved AROM 8-70 degrees (was 8-50 degrees last session).  Educated pt on benefits of ice application and encouraged to apply every day multiple times PRN.  No reports of pain at EOS.      Rehab Potential  Good    PT Frequency  3x / week    PT Duration  6 weeks    PT Treatment/Interventions  ADLs/Self Care Home Management;Aquatic Therapy;Cryotherapy;Electrical Stimulation;Iontophoresis 4mg /ml Dexamethasone;Moist Heat;DME Instruction;Gait training;Stair training;Functional mobility training;Therapeutic activities;Patient/family education;Therapeutic exercise;Balance training;Neuromuscular re-education;Cognitive remediation;Manual techniques;Passive range of motion;Taping    PT Next Visit Plan  F/U wiht compliance wiht HEP and ice applicaiton daily.  Continue session focus with ROM based exercises and stretching.      PT Home Exercise Plan  Eval: heel slide, seated knee flexion stretch, quad set       Patient will benefit from skilled therapeutic intervention in order to improve the following deficits and impairments:  Abnormal gait, Decreased skin integrity, Pain, Decreased mobility, Decreased scar mobility, Decreased strength, Decreased range of motion, Decreased endurance, Decreased activity tolerance, Decreased balance, Difficulty walking, Increased edema, Impaired flexibility, Hypomobility  Visit Diagnosis: Acute pain of right knee  Stiffness of right knee, not elsewhere classified  Muscle weakness (generalized)     Problem List Patient Active Problem List   Diagnosis Date Noted  . Sciatica of right side 12/07/2017  .  Abnormal EKG 11/08/2017  . Insomnia 09/20/2017  . Family history of colon cancer 10/10/2016  . IGT (impaired glucose tolerance) 08/10/2015  . Hyperlipidemia LDL goal <100 02/11/2014  . Essential hypertension 07/01/2013  . Osteoarthritis of both knees 09/20/2012  . Asthma in adult 02/27/2012  . Seasonal allergies 02/27/2012  . Overweight (BMI 25.0-29.9) 11/05/2011  . Hypothyroidism 06/06/2007  . Migraine with aura 06/06/2007  . ALLERGIC RHINITIS, SEASONAL 06/06/2007   Ihor Austin, LPTA; CBIS [phone]  Aldona Lento 01/26/2018, 3:00 PM  [facility] [address] [place], [place], [address] Phone: [phone]   Fax:  [phone]  Name: Gail Cox MRN: [id number] Date of Birth: [birth year]

## 2018-01-29 ENCOUNTER — Ambulatory Visit (HOSPITAL_COMMUNITY): Payer: Managed Care, Other (non HMO)

## 2018-01-29 ENCOUNTER — Encounter (HOSPITAL_COMMUNITY): Payer: Self-pay

## 2018-01-29 DIAGNOSIS — M25561 Pain in right knee: Secondary | ICD-10-CM | POA: Diagnosis not present

## 2018-01-29 DIAGNOSIS — M25661 Stiffness of right knee, not elsewhere classified: Secondary | ICD-10-CM

## 2018-01-29 DIAGNOSIS — M6281 Muscle weakness (generalized): Secondary | ICD-10-CM

## 2018-01-29 NOTE — Therapy (Signed)
[place], [place], [address] Phone: [phone]   Fax:  [phone]  Physical Therapy Treatment  Patient Details  Name: Gail Cox MRN: [id number] Date of Birth: 1965-06-15 Referring Provider (PT): Nestor Ramp, MD   Encounter Date: 01/29/2018  PT End of Session - 01/29/18 1348    Visit Number  3    Number of Visits  19    Date for PT Re-Evaluation  03/12/18   Minireassess 02/26/2018   Authorization Type  [employer] Managed (no auth required, no visit limit)    Authorization Time Period  01/22/18 - 03/16/18    Authorization - Visit Number  3    Authorization - Number of Visits  10    PT Start Time  5361    PT Stop Time  1426    PT Time Calculation (min)  41 min    Activity Tolerance  Patient tolerated treatment well;No increased pain    Behavior During Therapy  WFL for tasks assessed/performed       Past Medical History:  Diagnosis Date  . Allergic rhinitis, seasonal   . Asthma   . Hypothyroidism   . Migraine headache     Past Surgical History:  Procedure Laterality Date  . BREAST CYST EXCISION Left   . CHOLECYSTECTOMY  1999  . COLONOSCOPY  09/21/2011   Procedure: COLONOSCOPY;  Surgeon: Rogene Houston, MD;  [facility];  Service: Endoscopy;  Laterality: N/A;  830  . COLONOSCOPY N/A 11/11/2016   Procedure: COLONOSCOPY;  Surgeon: Rogene Houston, MD;  [facility];  Service: Endoscopy;  Laterality: N/A;  1155  . CYSTECTOMY  1977   Right arm  . CYSTECTOMY  2007   Left breast  . OVARIAN CYST SURGERY  2003   Removed and uterus  . SPINE SURGERY  2016   cervical spine  . THYROIDECTOMY  2006    There were no vitals filed for this visit.  Subjective Assessment - 01/29/18 1349    Subjective  Pt presents to therapy today with her SPC and states that she is only having intermittent sharp pains in her knee.     Patient Stated Goals  to improve knee mobility and strength, and  reduce pain to return to work    Currently in Pain?  No/denies          [facility] Adult PT Treatment/Exercise - 01/29/18 0001      Exercises   Exercises  Knee/Hip      Knee/Hip Exercises: Stretches   Active Hamstring Stretch  Right;3 reps;30 seconds    Active Hamstring Stretch Limitations  standing 12" step    Knee: Self-Stretch to increase Flexion  Left    Knee: Self-Stretch Limitations  knee drives 12" step 44R15"    Gastroc Stretch  3 reps;30 seconds    Gastroc Stretch Limitations  slant board      Knee/Hip Exercises: Standing   Heel Raises  Both;15 reps;5 seconds    Heel Raises Limitations  heel and toe    Knee Flexion  Right;15 reps    Terminal Knee Extension  Right;10 reps    Theraband Level (Terminal Knee Extension)  Level 2 (Red)    Terminal Knee Extension Limitations  5" holds    Rocker Board  2 minutes    Rocker Board Limitations  R/L       Knee/Hip Exercises: Supine   Quad Sets  10 reps    [employer]  Sets Limitations  good quad set noted    Short [employer]  10 reps    Short Arc Quad Sets Limitations  5" holds    Heel Slides  Right;10 reps    Heel Slides Limitations  5" holds    Knee Extension Limitations  6    Knee Flexion Limitations  61      Manual Therapy   Manual Therapy  Edema management    Manual therapy comments  performed separate rest of session    Edema Management  Retro massage wtih LE elevated            PT Education - 01/29/18 1349    Education Details  exercise technique, continue HEP    Person(s) Educated  Patient    Methods  Explanation;Demonstration    Comprehension  Verbalized understanding;Returned demonstration       PT Short Term Goals - 01/22/18 1303      PT SHORT TERM GOAL #1   Title  Pt will demo understanding of HEP, updatd PRN, to imrpove Rt knee mobility and LE strength to imrpove functional mobility to return to PLOF and work.    Time  2    Period  Weeks    Status  New    Target Date  02/05/18      PT SHORT TERM  GOAL #2   Title  Patient will improve ROM to 3-100 degrees to demonstrate significant improvement and to imrpove functional gait and moblity to be able to ascend/descend stairs.    Time  4    Period  Weeks    Status  New    Target Date  02/19/18      PT SHORT TERM GOAL #3   Title  Patient will have reduce edema of Rt knee with circumferential measure no greater than 0.5 cm larger than Lt knee at joint line to improve ROM for knee joint and muscle activation.    Time  4    Period  Weeks    Status  New        PT Long Term Goals - 01/22/18 1304      PT LONG TERM GOAL #1   Title  Patient will achieve 0-120 degrees of knee ROM or equal to Lt knee to imrpove functional symmetry of gait and to be able to ascend/descend stairs with reciprocol step pattern.     Time  8    Period  Weeks    Status  New    Target Date  03/19/18      PT LONG TERM GOAL #2   Title  Patient will have 4/5 or greater for all limited muscle groups on bil LE's to demonstrate improved functional LE strength.    Time  8    Period  Weeks    Status  New      PT LONG TERM GOAL #3   Title  Patient will ambulate at 1.0 m/s with no assistive device to demonstrate safe community ambulation speed and improve balance and WB tolerance ot Rt knee to be able to return to work with decreased fall risk.    Time  8    Period  Weeks    Status  New      PT LONG TERM GOAL #4   Title  Patient will improve FOTO to 39% or less limitation to indicate significant improvement in function with mobility and daily activities and reduced self reported limitation related to Rt knee pain.  Time  8    Period  Weeks    Status  New            Plan - 01/29/18 1428    Clinical Impression Statement  Continued with established POC focusing on ROM and edema. Pt presenting to therapy with her SPC. Progressed her to more ROM exercises in standing such as rockerboard, TKE, and standing knee flexion. Pt tolerating well, only requiring min  cues for technique. Good quad contraction noted during quad set. Ended with manual for edema. AROM 4-61deg this date. PT strongly encouraged her to work on her flexion more at home and she verbalized understanding. Pt f/u with Dr. Percell Miller Thursday 02/01/18 so forwarded this note to MD. Continue as planned, progressing as able.     Rehab Potential  Good    PT Frequency  3x / week    PT Duration  6 weeks    PT Treatment/Interventions  ADLs/Self Care Home Management;Aquatic Therapy;Cryotherapy;Electrical Stimulation;Iontophoresis 4mg /ml Dexamethasone;Moist Heat;DME Instruction;Gait training;Stair training;Functional mobility training;Therapeutic activities;Patient/family education;Therapeutic exercise;Balance training;Neuromuscular re-education;Cognitive remediation;Manual techniques;Passive range of motion;Taping    PT Next Visit Plan  Continue session focus with ROM based exercises and stretching.  F/U wiht compliance wiht HEP and ice applicaiton daily.    PT Home Exercise Plan  Eval: heel slide, seated knee flexion stretch, quad set    Consulted and Agree with Plan of Care  Patient       Patient will benefit from skilled therapeutic intervention in order to improve the following deficits and impairments:  Abnormal gait, Decreased skin integrity, Pain, Decreased mobility, Decreased scar mobility, Decreased strength, Decreased range of motion, Decreased endurance, Decreased activity tolerance, Decreased balance, Difficulty walking, Increased edema, Impaired flexibility, Hypomobility  Visit Diagnosis: Acute pain of right knee  Stiffness of right knee, not elsewhere classified  Muscle weakness (generalized)     Problem List Patient Active Problem List   Diagnosis Date Noted  . Sciatica of right side 12/07/2017  . Abnormal EKG 11/08/2017  . Insomnia 09/20/2017  . Family history of colon cancer 10/10/2016  . IGT (impaired glucose tolerance) 08/10/2015  . Hyperlipidemia LDL goal <100 02/11/2014   . Essential hypertension 07/01/2013  . Osteoarthritis of both knees 09/20/2012  . Asthma in adult 02/27/2012  . Seasonal allergies 02/27/2012  . Overweight (BMI 25.0-29.9) 11/05/2011  . Hypothyroidism 06/06/2007  . Migraine with aura 06/06/2007  . ALLERGIC RHINITIS, SEASONAL 06/06/2007       Geraldine Solar PT, DPT  [facility] [address] [place], [place], [address] Phone: [phone]   Fax:  [phone]  Name: CHIOMA MUKHERJEE MRN: [id number] Date of Birth: Nov 12, 1965

## 2018-02-02 ENCOUNTER — Encounter

## 2018-02-02 ENCOUNTER — Ambulatory Visit (HOSPITAL_COMMUNITY): Payer: Managed Care, Other (non HMO) | Attending: Orthopedic Surgery

## 2018-02-02 ENCOUNTER — Encounter (HOSPITAL_COMMUNITY): Payer: Self-pay

## 2018-02-02 DIAGNOSIS — M25561 Pain in right knee: Secondary | ICD-10-CM | POA: Diagnosis present

## 2018-02-02 DIAGNOSIS — M25661 Stiffness of right knee, not elsewhere classified: Secondary | ICD-10-CM

## 2018-02-02 DIAGNOSIS — M6281 Muscle weakness (generalized): Secondary | ICD-10-CM | POA: Insufficient documentation

## 2018-02-02 NOTE — Therapy (Signed)
[place], [place], [address] Phone: [phone]   Fax:  [phone]  Physical Therapy Treatment  Patient Details  Name: Gail Cox MRN: [id number] Date of Birth: May 30, 1965 Referring Provider (PT): Nestor Ramp, MD   Encounter Date: 02/02/2018  PT End of Session - 02/02/18 0819    Visit Number  4    Number of Visits  19    Date for PT Re-Evaluation  03/12/18   Minireassess 02/26/2018   Authorization Type  [employer] Managed (no auth required, no visit limit)    Authorization Time Period  01/22/18 - 03/16/18    Authorization - Visit Number  4    Authorization - Number of Visits  10    PT Start Time  0815    PT Stop Time  0102    PT Time Calculation (min)  42 min    Activity Tolerance  Patient tolerated treatment well;No increased pain    Behavior During Therapy  WFL for tasks assessed/performed       Past Medical History:  Diagnosis Date  . Allergic rhinitis, seasonal   . Asthma   . Hypothyroidism   . Migraine headache     Past Surgical History:  Procedure Laterality Date  . BREAST CYST EXCISION Left   . CHOLECYSTECTOMY  1999  . COLONOSCOPY  09/21/2011   Procedure: COLONOSCOPY;  Surgeon: Rogene Houston, MD;  [facility];  Service: Endoscopy;  Laterality: N/A;  830  . COLONOSCOPY N/A 11/11/2016   Procedure: COLONOSCOPY;  Surgeon: Rogene Houston, MD;  [facility];  Service: Endoscopy;  Laterality: N/A;  1155  . CYSTECTOMY  1977   Right arm  . CYSTECTOMY  2007   Left breast  . OVARIAN CYST SURGERY  2003   Removed and uterus  . SPINE SURGERY  2016   cervical spine  . THYROIDECTOMY  2006    There were no vitals filed for this visit.  Subjective Assessment - 02/02/18 0817    Subjective  Pt states that she saw Dr. Debroah Loop physician assistant yesterday who was pleased with everything thus far. She states she wishes she could bouce back from this quicker.     Patient  Stated Goals  to improve knee mobility and strength, and reduce pain to return to work    Currently in Pain?  No/denies             [facility] Adult PT Treatment/Exercise - 02/02/18 0001      Exercises   Exercises  Knee/Hip      Knee/Hip Exercises: Stretches   Active Hamstring Stretch  Right;3 reps;30 seconds    Active Hamstring Stretch Limitations  standing 12" step    Quad Stretch  Right;3 reps;30 seconds    Quad Stretch Limitations  prone with rope    Knee: Self-Stretch to increase Flexion  Left    Knee: Self-Stretch Limitations  knee drives 12" step [id number]"    Gastroc Stretch  3 reps;30 seconds    Gastroc Stretch Limitations  slant board      Knee/Hip Exercises: Standing   Heel Raises  Both;15 reps    Heel Raises Limitations  heel and toe    Knee Flexion  Right;15 reps    Terminal Knee Extension  Right;10 reps    Theraband Level (Terminal Knee Extension)  Level 2 (Red)    Terminal Knee Extension Limitations  5" holds    Rocker Board  2 minutes  Rocker Board Limitations  R/L       Knee/Hip Exercises: Supine   Heel Slides  Right;10 reps    Heel Slides Limitations  5" holds    Knee Extension Limitations  4    Knee Flexion Limitations  61      Knee/Hip Exercises: Prone   Hamstring Curl  15 reps    Hamstring Curl Limitations  2" holds      Manual Therapy   Manual Therapy  Edema management    Manual therapy comments  performed separate rest of session    Edema Management  Retro massage wtih LE elevated            PT Education - 02/02/18 0819    Education Details  exercise technique, continue HEP    Person(s) Educated  Patient    Methods  Explanation;Demonstration    Comprehension  Verbalized understanding;Returned demonstration       PT Short Term Goals - 01/22/18 1303      PT SHORT TERM GOAL #1   Title  Pt will demo understanding of HEP, updatd PRN, to imrpove Rt knee mobility and LE strength to imrpove functional mobility to return to PLOF and work.     Time  2    Period  Weeks    Status  New    Target Date  02/05/18      PT SHORT TERM GOAL #2   Title  Patient will improve ROM to 3-100 degrees to demonstrate significant improvement and to imrpove functional gait and moblity to be able to ascend/descend stairs.    Time  4    Period  Weeks    Status  New    Target Date  02/19/18      PT SHORT TERM GOAL #3   Title  Patient will have reduce edema of Rt knee with circumferential measure no greater than 0.5 cm larger than Lt knee at joint line to improve ROM for knee joint and muscle activation.    Time  4    Period  Weeks    Status  New        PT Long Term Goals - 01/22/18 1304      PT LONG TERM GOAL #1   Title  Patient will achieve 0-120 degrees of knee ROM or equal to Lt knee to imrpove functional symmetry of gait and to be able to ascend/descend stairs with reciprocol step pattern.     Time  8    Period  Weeks    Status  New    Target Date  03/19/18      PT LONG TERM GOAL #2   Title  Patient will have 4/5 or greater for all limited muscle groups on bil LE's to demonstrate improved functional LE strength.    Time  8    Period  Weeks    Status  New      PT LONG TERM GOAL #3   Title  Patient will ambulate at 1.0 m/s with no assistive device to demonstrate safe community ambulation speed and improve balance and WB tolerance ot Rt knee to be able to return to work with decreased fall risk.    Time  8    Period  Weeks    Status  New      PT LONG TERM GOAL #4   Title  Patient will improve FOTO to 39% or less limitation to indicate significant improvement in function with mobility and daily activities and reduced  self reported limitation related to Rt knee pain.     Time  8    Period  Weeks    Status  New            Plan - 02/02/18 8882    Clinical Impression Statement  Continued with established POC focusing on improving R knee ROM and edema. Min cues for form throughout therex but no reports of increased pain. Ended  with manual to address edema. AROM 4 to 61deg this date. strongly encouraged pt to work on her flexion at home and reprinted her HEP for her. Pt verbalizing frustrations with progress thus far and PT reassured pt that she is only 15 days post-op and encouraged her that she will begin to progress, it just takes time and she verbalized understanding.     Rehab Potential  Good    PT Frequency  3x / week    PT Duration  6 weeks    PT Treatment/Interventions  ADLs/Self Care Home Management;Aquatic Therapy;Cryotherapy;Electrical Stimulation;Iontophoresis 4mg /ml Dexamethasone;Moist Heat;DME Instruction;Gait training;Stair training;Functional mobility training;Therapeutic activities;Patient/family education;Therapeutic exercise;Balance training;Neuromuscular re-education;Cognitive remediation;Manual techniques;Passive range of motion;Taping    PT Next Visit Plan  Continue session focus with ROM based exercises and stretching.  F/U wiht compliance wiht HEP and ice applicaiton daily.    PT Home Exercise Plan  Eval: heel slide, seated knee flexion stretch, quad set    Consulted and Agree with Plan of Care  Patient       Patient will benefit from skilled therapeutic intervention in order to improve the following deficits and impairments:  Abnormal gait, Decreased skin integrity, Pain, Decreased mobility, Decreased scar mobility, Decreased strength, Decreased range of motion, Decreased endurance, Decreased activity tolerance, Decreased balance, Difficulty walking, Increased edema, Impaired flexibility, Hypomobility  Visit Diagnosis: Acute pain of right knee  Stiffness of right knee, not elsewhere classified  Muscle weakness (generalized)     Problem List Patient Active Problem List   Diagnosis Date Noted  . Sciatica of right side 12/07/2017  . Abnormal EKG 11/08/2017  . Insomnia 09/20/2017  . Family history of colon cancer 10/10/2016  . IGT (impaired glucose tolerance) 08/10/2015  . Hyperlipidemia  LDL goal <100 02/11/2014  . Essential hypertension 07/01/2013  . Osteoarthritis of both knees 09/20/2012  . Asthma in adult 02/27/2012  . Seasonal allergies 02/27/2012  . Overweight (BMI 25.0-29.9) 11/05/2011  . Hypothyroidism 06/06/2007  . Migraine with aura 06/06/2007  . ALLERGIC RHINITIS, SEASONAL 06/06/2007        Geraldine Solar PT, DPT  [facility] [address] [place], [place], [address] Phone: [phone]   Fax:  [phone]  Name: JAZLIN TAPSCOTT MRN: [id number] Date of Birth: 12/05/1965

## 2018-02-06 ENCOUNTER — Ambulatory Visit (HOSPITAL_COMMUNITY): Payer: Managed Care, Other (non HMO) | Admitting: Physical Therapy

## 2018-02-06 DIAGNOSIS — M25561 Pain in right knee: Secondary | ICD-10-CM | POA: Diagnosis not present

## 2018-02-06 DIAGNOSIS — M25661 Stiffness of right knee, not elsewhere classified: Secondary | ICD-10-CM

## 2018-02-06 DIAGNOSIS — M6281 Muscle weakness (generalized): Secondary | ICD-10-CM

## 2018-02-06 NOTE — Therapy (Signed)
[place] [place], [place], [address] Phone: [phone]   Fax:  [phone]  Physical Therapy Treatment  Patient Details  Name: Gail Cox MRN: [id number] Date of Birth: 07-12-1965 Referring Provider (PT): Nestor Ramp, MD   Encounter Date: 02/06/2018  PT End of Session - 02/06/18 0959    Visit Number  5    Number of Visits  19    Date for PT Re-Evaluation  03/12/18   Minireassess 02/26/2018   Authorization Type  [employer] Managed (no auth required, no visit limit)    Authorization Time Period  01/22/18 - 03/16/18    Authorization - Visit Number  5    Authorization - Number of Visits  10    PT Start Time  0945    PT Stop Time  1030    PT Time Calculation (min)  45 min    Activity Tolerance  Patient tolerated treatment well;No increased pain    Behavior During Therapy  WFL for tasks assessed/performed       Past Medical History:  Diagnosis Date  . Allergic rhinitis, seasonal   . Asthma   . Hypothyroidism   . Migraine headache     Past Surgical History:  Procedure Laterality Date  . BREAST CYST EXCISION Left   . CHOLECYSTECTOMY  1999  . COLONOSCOPY  09/21/2011   Procedure: COLONOSCOPY;  Surgeon: Rogene Houston, MD;  [facility];  Service: Endoscopy;  Laterality: N/A;  830  . COLONOSCOPY N/A 11/11/2016   Procedure: COLONOSCOPY;  Surgeon: Rogene Houston, MD;  [facility];  Service: Endoscopy;  Laterality: N/A;  1155  . CYSTECTOMY  1977   Right arm  . CYSTECTOMY  2007   Left breast  . OVARIAN CYST SURGERY  2003   Removed and uterus  . SPINE SURGERY  2016   cervical spine  . THYROIDECTOMY  2006    There were no vitals filed for this visit.  Subjective Assessment - 02/06/18 0942    Subjective  PT states she is a little stiff today; she is completing her exercises.      Patient Stated Goals  to improve knee mobility and strength, and reduce pain to return to work    Currently  in Pain?  Yes    Pain Score  7     Pain Location  Knee    Pain Orientation  Right    Pain Descriptors / Indicators  Tightness    Pain Type  Acute pain    Pain Onset  More than a month ago    Pain Frequency  Intermittent    Aggravating Factors   weather, activity     Pain Relieving Factors  ice     Effect of Pain on Daily Activities  limits                       [facility] Adult PT Treatment/Exercise - 02/06/18 0001      Exercises   Exercises  Knee/Hip      Knee/Hip Exercises: Stretches   Active Hamstring Stretch  Right;3 reps;30 seconds    Active Hamstring Stretch Limitations  supine     Passive Hamstring Stretch  Right;3 reps;30 seconds    Passive Hamstring Stretch Limitations  standing 12" step     Quad Stretch  Right;3 reps;30 seconds    Quad Stretch Limitations  prone with rope    Knee: Self-Stretch to increase Flexion  Left    Knee: Self-Stretch Limitations  knee drives 12" step [id number]"    Gastroc Stretch  3 reps;30 seconds    Gastroc Stretch Limitations  slant board      Knee/Hip Exercises: Standing   Heel Raises  Both;15 reps    Knee Flexion  Right;15 reps    Terminal Knee Extension  Right;15 reps    Theraband Level (Terminal Knee Extension)  Level 2 (Red)    Terminal Knee Extension Limitations  5" holds    Rocker Board  2 minutes    Rocker Board Limitations  R/L       Knee/Hip Exercises: Supine   Quad Sets  10 reps    Heel Slides  Right;10 reps    Heel Slides Limitations  5" holds    Knee Extension Limitations  4    Knee Flexion Limitations  72      Knee/Hip Exercises: Prone   Hamstring Curl  15 reps    Hamstring Curl Limitations  2" holds    Contract/Relax to Increase Flexion  x5      Modalities   Modalities  Cryotherapy      Cryotherapy   Cryotherapy Location  Knee    Type of Cryotherapy  Ice pack               PT Short Term Goals - 01/22/18 1303      PT SHORT TERM GOAL #1   Title  Pt will demo understanding of HEP, updatd  PRN, to imrpove Rt knee mobility and LE strength to imrpove functional mobility to return to PLOF and work.    Time  2    Period  Weeks    Status  New    Target Date  02/05/18      PT SHORT TERM GOAL #2   Title  Patient will improve ROM to 3-100 degrees to demonstrate significant improvement and to imrpove functional gait and moblity to be able to ascend/descend stairs.    Time  4    Period  Weeks    Status  New    Target Date  02/19/18      PT SHORT TERM GOAL #3   Title  Patient will have reduce edema of Rt knee with circumferential measure no greater than 0.5 cm larger than Lt knee at joint line to improve ROM for knee joint and muscle activation.    Time  4    Period  Weeks    Status  New        PT Long Term Goals - 01/22/18 1304      PT LONG TERM GOAL #1   Title  Patient will achieve 0-120 degrees of knee ROM or equal to Lt knee to imrpove functional symmetry of gait and to be able to ascend/descend stairs with reciprocol step pattern.     Time  8    Period  Weeks    Status  New    Target Date  03/19/18      PT LONG TERM GOAL #2   Title  Patient will have 4/5 or greater for all limited muscle groups on bil LE's to demonstrate improved functional LE strength.    Time  8    Period  Weeks    Status  New      PT LONG TERM GOAL #3   Title  Patient will ambulate at 1.0 m/s with no assistive device to demonstrate safe community ambulation speed and improve balance and WB  tolerance ot Rt knee to be able to return to work with decreased fall risk.    Time  8    Period  Weeks    Status  New      PT LONG TERM GOAL #4   Title  Patient will improve FOTO to 39% or less limitation to indicate significant improvement in function with mobility and daily activities and reduced self reported limitation related to Rt knee pain.     Time  8    Period  Weeks    Status  New            Plan - 02/06/18 1012    Clinical Impression Statement  Therapist used ice during prone  activities as pt states that this seems to do better for her.  Added reps to prone knee flexion and contract relax to increase AROM.  Pt gaining in Active flexion,    Rehab Potential  Good    PT Frequency  3x / week    PT Duration  6 weeks    PT Treatment/Interventions  ADLs/Self Care Home Management;Aquatic Therapy;Cryotherapy;Electrical Stimulation;Iontophoresis 4mg /ml Dexamethasone;Moist Heat;DME Instruction;Gait training;Stair training;Functional mobility training;Therapeutic activities;Patient/family education;Therapeutic exercise;Balance training;Neuromuscular re-education;Cognitive remediation;Manual techniques;Passive range of motion;Taping    PT Next Visit Plan   ROM based exercises and stretching until ROM is improved then begin balance and strengthening.    PT Home Exercise Plan  Eval: heel slide, seated knee flexion stretch, quad set    Consulted and Agree with Plan of Care  Patient       Patient will benefit from skilled therapeutic intervention in order to improve the following deficits and impairments:  Abnormal gait, Decreased skin integrity, Pain, Decreased mobility, Decreased scar mobility, Decreased strength, Decreased range of motion, Decreased endurance, Decreased activity tolerance, Decreased balance, Difficulty walking, Increased edema, Impaired flexibility, Hypomobility  Visit Diagnosis: Acute pain of right knee  Stiffness of right knee, not elsewhere classified  Muscle weakness (generalized)     Problem List Patient Active Problem List   Diagnosis Date Noted  . Sciatica of right side 12/07/2017  . Abnormal EKG 11/08/2017  . Insomnia 09/20/2017  . Family history of colon cancer 10/10/2016  . IGT (impaired glucose tolerance) 08/10/2015  . Hyperlipidemia LDL goal <100 02/11/2014  . Essential hypertension 07/01/2013  . Osteoarthritis of both knees 09/20/2012  . Asthma in adult 02/27/2012  . Seasonal allergies 02/27/2012  . Overweight (BMI 25.0-29.9) 11/05/2011   . Hypothyroidism 06/06/2007  . Migraine with aura 06/06/2007  . ALLERGIC RHINITIS, SEASONAL 06/06/2007   Rayetta Humphrey, PT CLT [phone] 02/06/2018, 10:32 AM  [facility] [address] [place], [place], [address] Phone: [phone]   Fax:  [phone]  Name: Gail Cox MRN: [id number] Date of Birth: 02/14/1965

## 2018-02-07 ENCOUNTER — Ambulatory Visit (HOSPITAL_COMMUNITY): Payer: Managed Care, Other (non HMO)

## 2018-02-07 ENCOUNTER — Encounter (HOSPITAL_COMMUNITY): Payer: Self-pay

## 2018-02-07 DIAGNOSIS — M25661 Stiffness of right knee, not elsewhere classified: Secondary | ICD-10-CM

## 2018-02-07 DIAGNOSIS — M25561 Pain in right knee: Secondary | ICD-10-CM | POA: Diagnosis not present

## 2018-02-07 DIAGNOSIS — M6281 Muscle weakness (generalized): Secondary | ICD-10-CM

## 2018-02-07 NOTE — Therapy (Signed)
[facility] [place], [place], [address] Phone: [phone]   Fax:  [phone]  Physical Therapy Treatment  Patient Details  Name: Gail Cox MRN: [id number] Date of Birth: March 06, 1965 Referring Provider (PT): Nestor Ramp, MD (Surgeon: Marchia Bond)   Encounter Date: 02/07/2018  PT End of Session - 02/07/18 1034    Visit Number  6    Number of Visits  19    Date for PT Re-Evaluation  03/12/18   Minireassess 02/26/2018   Authorization Type  [employer] Managed (no auth required, no visit limit)    Authorization Time Period  01/22/18 - 03/16/18    Authorization - Visit Number  6    Authorization - Number of Visits  10    PT Start Time  [phone]   4' on bike, not included with charges   PT Stop Time  1035   5' on ice, not included in charges   PT Time Calculation (min)  52 min    Activity Tolerance  Patient tolerated treatment well;No increased pain    Behavior During Therapy  WFL for tasks assessed/performed       Past Medical History:  Diagnosis Date  . Allergic rhinitis, seasonal   . Asthma   . Hypothyroidism   . Migraine headache     Past Surgical History:  Procedure Laterality Date  . BREAST CYST EXCISION Left   . CHOLECYSTECTOMY  1999  . COLONOSCOPY  09/21/2011   Procedure: COLONOSCOPY;  Surgeon: Rogene Houston, MD;  [facility];  Service: Endoscopy;  Laterality: N/A;  830  . COLONOSCOPY N/A 11/11/2016   Procedure: COLONOSCOPY;  Surgeon: Rogene Houston, MD;  [facility];  Service: Endoscopy;  Laterality: N/A;  1155  . CYSTECTOMY  1977   Right arm  . CYSTECTOMY  2007   Left breast  . OVARIAN CYST SURGERY  2003   Removed and uterus  . SPINE SURGERY  2016   cervical spine  . THYROIDECTOMY  2006    There were no vitals filed for this visit.  Subjective Assessment - 02/07/18 0948    Subjective  Pt stated she had migraine this morning, took medication and feeling better at  entrance today.  No reports of pain, just stiffness in knee today.      Patient Stated Goals  to improve knee mobility and strength, and reduce pain to return to work    Currently in Pain?  No/denies         [facility] PT Assessment - 02/07/18 0001      Assessment   Medical Diagnosis  Rt Partial Knee Replacement    Referring Provider (PT)  Nestor Ramp, MD   Surgeon: Marchia Bond   Onset Date/Surgical Date  01/18/18    Next MD Visit  02/22/2018                   [facility] PT Treatment/Exercise - 02/07/18 0001      Knee/Hip Exercises: Stretches   Active Hamstring Stretch  Right;3 reps;30 seconds    Active Hamstring Stretch Limitations  supine     Quad Stretch  Right;3 reps;30 seconds    Quad Stretch Limitations  prone with rope    Knee: Self-Stretch to increase Flexion  Right    Knee: Self-Stretch Limitations  knee drives 12" step 56E33"    Gastroc Stretch  3 reps;30 seconds    Gastroc Stretch Limitations  slant board  Other Knee/Hip Stretches  AAROM flexion with towel supine      Knee/Hip Exercises: Aerobic   Stationary Bike  Rocking seat 15 x 4'      Knee/Hip Exercises: Standing   Heel Raises  Both;20 reps;3 seconds    Heel Raises Limitations  toe raises 15x     Terminal Knee Extension  Right;15 reps    Theraband Level (Terminal Knee Extension)  Level 3 (Green)    Terminal Knee Extension Limitations  5" holds    Rocker Board  2 minutes    Rocker Board Limitations  R/L       Knee/Hip Exercises: Supine   Quad Sets  10 reps    Heel Slides  Right;10 reps    Heel Slides Limitations  5" holds    Knee Extension  AROM    Knee Extension Limitations  3   was 4   Knee Flexion  AROM    Knee Flexion Limitations  80   was 72     Knee/Hip Exercises: Prone   Hamstring Curl  15 reps    Hamstring Curl Limitations  2" holds    Contract/Relax to Increase Flexion  x5      Modalities   Modalities  Cryotherapy      Cryotherapy   Cryotherapy Location  Knee     Type of Cryotherapy  Ice pack               PT Short Term Goals - 01/22/18 1303      PT SHORT TERM GOAL #1   Title  Pt will demo understanding of HEP, updatd PRN, to imrpove Rt knee mobility and LE strength to imrpove functional mobility to return to PLOF and work.    Time  2    Period  Weeks    Status  New    Target Date  02/05/18      PT SHORT TERM GOAL #2   Title  Patient will improve ROM to 3-100 degrees to demonstrate significant improvement and to imrpove functional gait and moblity to be able to ascend/descend stairs.    Time  4    Period  Weeks    Status  New    Target Date  02/19/18      PT SHORT TERM GOAL #3   Title  Patient will have reduce edema of Rt knee with circumferential measure no greater than 0.5 cm larger than Lt knee at joint line to improve ROM for knee joint and muscle activation.    Time  4    Period  Weeks    Status  New        PT Long Term Goals - 01/22/18 1304      PT LONG TERM GOAL #1   Title  Patient will achieve 0-120 degrees of knee ROM or equal to Lt knee to imrpove functional symmetry of gait and to be able to ascend/descend stairs with reciprocol step pattern.     Time  8    Period  Weeks    Status  New    Target Date  03/19/18      PT LONG TERM GOAL #2   Title  Patient will have 4/5 or greater for all limited muscle groups on bil LE's to demonstrate improved functional LE strength.    Time  8    Period  Weeks    Status  New      PT LONG TERM GOAL #3   Title  Patient  will ambulate at 1.0 m/s with no assistive device to demonstrate safe community ambulation speed and improve balance and WB tolerance ot Rt knee to be able to return to work with decreased fall risk.    Time  8    Period  Weeks    Status  New      PT LONG TERM GOAL #4   Title  Patient will improve FOTO to 39% or less limitation to indicate significant improvement in function with mobility and daily activities and reduced self reported limitation related to Rt  knee pain.     Time  8    Period  Weeks    Status  New            Plan - 02/07/18 1013    Clinical Impression Statement  Continued session focus with knee mobility.  Added bike rocking for mobility.  Pt. reports ice during stretches are helpful for pain control.  No reports of pain through session.  Did review ice application and encouraged to increase frequency for pain and edema control.  AROM 3-80 degrees (was 4-72 degrees last session).    Rehab Potential  Good    PT Frequency  3x / week    PT Duration  6 weeks    PT Treatment/Interventions  ADLs/Self Care Home Management;Aquatic Therapy;Cryotherapy;Electrical Stimulation;Iontophoresis 4mg /ml Dexamethasone;Moist Heat;DME Instruction;Gait training;Stair training;Functional mobility training;Therapeutic activities;Patient/family education;Therapeutic exercise;Balance training;Neuromuscular re-education;Cognitive remediation;Manual techniques;Passive range of motion;Taping    PT Next Visit Plan   ROM based exercises and stretching until ROM is improved then begin balance and strengthening.    PT Home Exercise Plan  Eval: heel slide, seated knee flexion stretch, quad set    Consulted and Agree with Plan of Care  Patient       Patient will benefit from skilled therapeutic intervention in order to improve the following deficits and impairments:  Abnormal gait, Decreased skin integrity, Pain, Decreased mobility, Decreased scar mobility, Decreased strength, Decreased range of motion, Decreased endurance, Decreased activity tolerance, Decreased balance, Difficulty walking, Increased edema, Impaired flexibility, Hypomobility  Visit Diagnosis: Acute pain of right knee  Stiffness of right knee, not elsewhere classified  Muscle weakness (generalized)     Problem List Patient Active Problem List   Diagnosis Date Noted  . Sciatica of right side 12/07/2017  . Abnormal EKG 11/08/2017  . Insomnia 09/20/2017  . Family history of colon  cancer 10/10/2016  . IGT (impaired glucose tolerance) 08/10/2015  . Hyperlipidemia LDL goal <100 02/11/2014  . Essential hypertension 07/01/2013  . Osteoarthritis of both knees 09/20/2012  . Asthma in adult 02/27/2012  . Seasonal allergies 02/27/2012  . Overweight (BMI 25.0-29.9) 11/05/2011  . Hypothyroidism 06/06/2007  . Migraine with aura 06/06/2007  . ALLERGIC RHINITIS, SEASONAL 06/06/2007   Ihor Austin, LPTA; CBIS [phone] Aldona Lento 02/07/2018, 10:41 AM  [place], [place], [address] Phone: [phone]   Fax:  [phone]  Name: QUINNETTA ROEPKE MRN: [id number] Date of Birth: Feb 18, 1965

## 2018-02-08 ENCOUNTER — Telehealth (HOSPITAL_COMMUNITY): Payer: Self-pay

## 2018-02-08 NOTE — Telephone Encounter (Signed)
Emailed [place] request for Medical Records to [facility] today. NF 02/08/2018

## 2018-02-09 ENCOUNTER — Encounter (HOSPITAL_COMMUNITY): Payer: Self-pay

## 2018-02-09 ENCOUNTER — Ambulatory Visit (HOSPITAL_COMMUNITY): Payer: Managed Care, Other (non HMO)

## 2018-02-09 DIAGNOSIS — M25561 Pain in right knee: Secondary | ICD-10-CM | POA: Diagnosis not present

## 2018-02-09 DIAGNOSIS — M25661 Stiffness of right knee, not elsewhere classified: Secondary | ICD-10-CM

## 2018-02-09 DIAGNOSIS — M6281 Muscle weakness (generalized): Secondary | ICD-10-CM

## 2018-02-09 NOTE — Therapy (Signed)
[place], [place], [address] Phone: [phone]   Fax:  [phone]  Physical Therapy Treatment  Patient Details  Name: Gail Cox MRN: [id number] Date of Birth: 21-Jun-1965 Referring Provider (PT): Nestor Ramp, MD (Surgeon: Marchia Bond)   Encounter Date: 02/09/2018  PT End of Session - 02/09/18 0821    Visit Number  7    Number of Visits  19    Date for PT Re-Evaluation  03/12/18    Authorization Type  [employer] Managed (no auth required, no visit limit)    Authorization Time Period  01/22/18 - 03/16/18    Authorization - Visit Number  7    Authorization - Number of Visits  10    PT Start Time  [phone]   4' on bike, not included with charges   PT Stop Time  0907   8' on ice at [facility], not included with charges   PT Time Calculation (min)  51 min    Activity Tolerance  Patient tolerated treatment well;No increased pain    Behavior During Therapy  WFL for tasks assessed/performed       Past Medical History:  Diagnosis Date  . Allergic rhinitis, seasonal   . Asthma   . Hypothyroidism   . Migraine headache     Past Surgical History:  Procedure Laterality Date  . BREAST CYST EXCISION Left   . CHOLECYSTECTOMY  1999  . COLONOSCOPY  09/21/2011   Procedure: COLONOSCOPY;  Surgeon: Rogene Houston, MD;  [facility];  Service: Endoscopy;  Laterality: N/A;  830  . COLONOSCOPY N/A 11/11/2016   Procedure: COLONOSCOPY;  Surgeon: Rogene Houston, MD;  [facility];  Service: Endoscopy;  Laterality: N/A;  1155  . CYSTECTOMY  1977   Right arm  . CYSTECTOMY  2007   Left breast  . OVARIAN CYST SURGERY  2003   Removed and uterus  . SPINE SURGERY  2016   cervical spine  . THYROIDECTOMY  2006    There were no vitals filed for this visit.  Subjective Assessment - 02/09/18 0821    Subjective  Pt stated she is stiff and increased pain this morning, current pain scale 7/10 Rt knee    Patient  Stated Goals  to improve knee mobility and strength, and reduce pain to return to work    Currently in Pain?  Yes    Pain Score  7     Pain Location  Knee    Pain Orientation  Right    Pain Descriptors / Indicators  Tightness    Pain Type  Acute pain    Pain Onset  More than a month ago    Pain Frequency  Intermittent    Aggravating Factors   weather, activity    Pain Relieving Factors  ice    Effect of Pain on Daily Activities  limits         [facility] PT Assessment - 02/09/18 0001      Assessment   Medical Diagnosis  Rt Partial Knee Replacement    Referring Provider (PT)  Nestor Ramp, MD   Surgeon: Marchia Bond   Onset Date/Surgical Date  01/18/18    Next MD Visit  02/22/2018                   [facility] Adult PT Treatment/Exercise - 02/09/18 0001      Knee/Hip Exercises: Stretches   Active Hamstring Stretch  Right;3  reps;30 seconds    Active Hamstring Stretch Limitations  supine     Quad Stretch  Right;3 reps;30 seconds    Quad Stretch Limitations  prone with rope    Knee: Self-Stretch to increase Flexion  Right    Knee: Self-Stretch Limitations  knee drives 12" step 46T03"    Gastroc Stretch  3 reps;30 seconds    Gastroc Stretch Limitations  slant board    Other Knee/Hip Stretches  AAROM flexion with towel supine (SKTC)      Knee/Hip Exercises: Aerobic   Stationary Bike  Rocking seat 14. x 4'      Knee/Hip Exercises: Standing   Heel Raises  Both;20 reps;3 seconds    Heel Raises Limitations  toe raises 15x     Knee Flexion  Right;15 reps    Terminal Knee Extension  Right;15 reps    Theraband Level (Terminal Knee Extension)  Level 3 (Green)    Terminal Knee Extension Limitations  5" holds    Functional Squat  10 reps    Functional Squat Limitations  front of chair, cueing for mechanics and equal stance phase for mobility    Rocker Board  2 minutes    Rocker Board Limitations  R/L       Knee/Hip Exercises: Seated   Heel Slides  10 reps      Knee/Hip  Exercises: Supine   Heel Slides  Right;10 reps    Heel Slides Limitations  5" holds    Knee Extension  AROM    Knee Extension Limitations  3    Knee Flexion  AROM    Knee Flexion Limitations  94wa   was 80 last session     Knee/Hip Exercises: Prone   Hamstring Curl  15 reps    Hamstring Curl Limitations  2" holds    Contract/Relax to Increase Flexion  x5      Modalities   Modalities  Cryotherapy      Cryotherapy   Cryotherapy Location  Knee    Type of Cryotherapy  Ice pack      Manual Therapy   Manual Therapy  Edema management    Manual therapy comments  performed separate rest of session    Edema Management  Retro massage wtih LE elevated               PT Short Term Goals - 01/22/18 1303      PT SHORT TERM GOAL #1   Title  Pt will demo understanding of HEP, updatd PRN, to imrpove Rt knee mobility and LE strength to imrpove functional mobility to return to PLOF and work.    Time  2    Period  Weeks    Status  New    Target Date  02/05/18      PT SHORT TERM GOAL #2   Title  Patient will improve ROM to 3-100 degrees to demonstrate significant improvement and to imrpove functional gait and moblity to be able to ascend/descend stairs.    Time  4    Period  Weeks    Status  New    Target Date  02/19/18      PT SHORT TERM GOAL #3   Title  Patient will have reduce edema of Rt knee with circumferential measure no greater than 0.5 cm larger than Lt knee at joint line to improve ROM for knee joint and muscle activation.    Time  4    Period  Weeks    Status  New        PT Long Term Goals - 01/22/18 1304      PT LONG TERM GOAL #1   Title  Patient will achieve 0-120 degrees of knee ROM or equal to Lt knee to imrpove functional symmetry of gait and to be able to ascend/descend stairs with reciprocol step pattern.     Time  8    Period  Weeks    Status  New    Target Date  03/19/18      PT LONG TERM GOAL #2   Title  Patient will have 4/5 or greater for all  limited muscle groups on bil LE's to demonstrate improved functional LE strength.    Time  8    Period  Weeks    Status  New      PT LONG TERM GOAL #3   Title  Patient will ambulate at 1.0 m/s with no assistive device to demonstrate safe community ambulation speed and improve balance and WB tolerance ot Rt knee to be able to return to work with decreased fall risk.    Time  8    Period  Weeks    Status  New      PT LONG TERM GOAL #4   Title  Patient will improve FOTO to 39% or less limitation to indicate significant improvement in function with mobility and daily activities and reduced self reported limitation related to Rt knee pain.     Time  8    Period  Weeks    Status  New            Plan - 02/09/18 1058    Clinical Impression Statement  Continued session focus with knee mobility and manual techiques/ice for pain control.  Added minisquats for knee flexion and functional strengthening with some cueing to improve mechanics and equalize weight bearing wiht new task.  Noted some edema present proximal knee on medial and lateral aspect, resumed retro massage for edema and pain control.  Pt improved AROM 3-94 degrees this session (was 3-80 degrees last session.)  Pt reports frustation with knee mobility, reviewed progress and encouraging words as she is progressing well as made gains each session.  EOS with ice for pain control with reports of pain reduced 5/10.    Rehab Potential  Good    PT Frequency  3x / week    PT Duration  6 weeks    PT Treatment/Interventions  ADLs/Self Care Home Management;Aquatic Therapy;Cryotherapy;Electrical Stimulation;Iontophoresis 4mg /ml Dexamethasone;Moist Heat;DME Instruction;Gait training;Stair training;Functional mobility training;Therapeutic activities;Patient/family education;Therapeutic exercise;Balance training;Neuromuscular re-education;Cognitive remediation;Manual techniques;Passive range of motion;Taping    PT Next Visit Plan   ROM based  exercises and stretching until ROM is improved then begin balance and strengthening.    PT Home Exercise Plan  Eval: heel slide, seated knee flexion stretch, quad set       Patient will benefit from skilled therapeutic intervention in order to improve the following deficits and impairments:  Abnormal gait, Decreased skin integrity, Pain, Decreased mobility, Decreased scar mobility, Decreased strength, Decreased range of motion, Decreased endurance, Decreased activity tolerance, Decreased balance, Difficulty walking, Increased edema, Impaired flexibility, Hypomobility  Visit Diagnosis: Acute pain of right knee  Stiffness of right knee, not elsewhere classified  Muscle weakness (generalized)     Problem List Patient Active Problem List   Diagnosis Date Noted  . Sciatica of right side 12/07/2017  . Abnormal EKG 11/08/2017  . Insomnia 09/20/2017  . Family history of colon cancer  10/10/2016  . IGT (impaired glucose tolerance) 08/10/2015  . Hyperlipidemia LDL goal <100 02/11/2014  . Essential hypertension 07/01/2013  . Osteoarthritis of both knees 09/20/2012  . Asthma in adult 02/27/2012  . Seasonal allergies 02/27/2012  . Overweight (BMI 25.0-29.9) 11/05/2011  . Hypothyroidism 06/06/2007  . Migraine with aura 06/06/2007  . ALLERGIC RHINITIS, SEASONAL 06/06/2007   Ihor Austin, LPTA; CBIS [phone]  Aldona Lento 02/09/2018, 11:04 AM  [place], [place], [address] Phone: [phone]   Fax:  [phone]  Name: GIUSEPPINA QUINONES MRN: [id number] Date of Birth: 16-Aug-1965

## 2018-02-12 ENCOUNTER — Encounter (HOSPITAL_COMMUNITY): Payer: Self-pay

## 2018-02-12 ENCOUNTER — Other Ambulatory Visit: Payer: Self-pay

## 2018-02-12 ENCOUNTER — Ambulatory Visit (HOSPITAL_COMMUNITY): Payer: Managed Care, Other (non HMO)

## 2018-02-12 DIAGNOSIS — M25561 Pain in right knee: Secondary | ICD-10-CM | POA: Diagnosis not present

## 2018-02-12 DIAGNOSIS — M25661 Stiffness of right knee, not elsewhere classified: Secondary | ICD-10-CM

## 2018-02-12 DIAGNOSIS — M6281 Muscle weakness (generalized): Secondary | ICD-10-CM

## 2018-02-12 NOTE — Therapy (Signed)
[facility] [place], [place], [address] Phone: [phone]   Fax:  [phone]  Physical Therapy Treatment  Patient Details  Name: Gail Cox MRN: [id number] Date of Birth: May 02, 1965 Referring Provider (PT): Nestor Ramp, MD (Surgeon: Marchia Bond)   Encounter Date: 02/12/2018  PT End of Session - 02/12/18 0833    Visit Number  8    Number of Visits  19    Date for PT Re-Evaluation  03/12/18    Authorization Type  [employer] Managed (no auth required, no visit limit)    Authorization Time Period  01/22/18 - 03/16/18    Authorization - Visit Number  8    Authorization - Number of Visits  10    PT Start Time  9622    PT Stop Time  2979    PT Time Calculation (min)  41 min    Activity Tolerance  Patient tolerated treatment well;No increased pain    Behavior During Therapy  WFL for tasks assessed/performed       Past Medical History:  Diagnosis Date  . Allergic rhinitis, seasonal   . Asthma   . Hypothyroidism   . Migraine headache     Past Surgical History:  Procedure Laterality Date  . BREAST CYST EXCISION Left   . CHOLECYSTECTOMY  1999  . COLONOSCOPY  09/21/2011   Procedure: COLONOSCOPY;  Surgeon: Rogene Houston, MD;  [facility];  Service: Endoscopy;  Laterality: N/A;  830  . COLONOSCOPY N/A 11/11/2016   Procedure: COLONOSCOPY;  Surgeon: Rogene Houston, MD;  [facility];  Service: Endoscopy;  Laterality: N/A;  1155  . CYSTECTOMY  1977   Right arm  . CYSTECTOMY  2007   Left breast  . OVARIAN CYST SURGERY  2003   Removed and uterus  . SPINE SURGERY  2016   cervical spine  . THYROIDECTOMY  2006    There were no vitals filed for this visit.  Subjective Assessment - 02/12/18 0821    Subjective  Patient reports her knee is still very stiff and that she has been walking around her home to try to loosen it up.     Limitations  House hold activities;Walking;Standing;Lifting     Patient Stated Goals  to improve knee mobility and strength, and reduce pain to return to work    Currently in Pain?  Yes    Pain Score  5     Pain Location  Knee    Pain Orientation  Right    Pain Descriptors / Indicators  Tightness    Pain Type  Acute pain;Surgical pain    Pain Onset  More than a month ago    Pain Frequency  Intermittent    Aggravating Factors   walking, getting up and down    Pain Relieving Factors  ice       [facility] Adult PT Treatment/Exercise - 02/12/18 0001      Knee/Hip Exercises: Stretches   Active Hamstring Stretch  Right;3 reps;30 seconds    Active Hamstring Stretch Limitations  12" box    Quad Stretch  Right;3 reps;30 seconds    Quad Stretch Limitations  prone with rope    Knee: Self-Stretch to increase Flexion  Right;3 reps;30 seconds    Knee: Self-Stretch Limitations  12" box    Gastroc Stretch  3 reps;30 seconds    Gastroc Stretch Limitations  slant board      Knee/Hip Exercises: Aerobic  Stationary Bike  AROM for 4 minutes, seat 14, half revolutions      Knee/Hip Exercises: Standing   Heel Raises  Both;20 reps;3 seconds   on incline   Heel Raises Limitations  1x 20 toes raises on decline    Terminal Knee Extension  Right;2 sets;10 reps;Theraband    Theraband Level (Terminal Knee Extension)  Level 4 (Blue)    Terminal Knee Extension Limitations  5" holds    Rocker Board  2 minutes    Rocker Board Limitations  2x 1 minute lateral      Knee/Hip Exercises: Supine   Knee Extension  AROM    Knee Extension Limitations  2    Knee Flexion  AROM    Knee Flexion Limitations  99      Knee/Hip Exercises: Prone   Hamstring Curl  1 set;15 reps    Hamstring Curl Limitations  5" holds      Manual Therapy   Manual Therapy  Edema management    Manual therapy comments  performed separate rest of session    Edema Management  Retro massage with LE elevated        PT Education - 02/12/18 7673    Education Details  educated on exercises throughout  and updated HEP.    Person(s) Educated  Patient    Methods  Explanation;Handout    Comprehension  Verbalized understanding;Returned demonstration       PT Short Term Goals - 01/22/18 1303      PT SHORT TERM GOAL #1   Title  Pt will demo understanding of HEP, updatd PRN, to imrpove Rt knee mobility and LE strength to imrpove functional mobility to return to PLOF and work.    Time  2    Period  Weeks    Status  New    Target Date  02/05/18      PT SHORT TERM GOAL #2   Title  Patient will improve ROM to 3-100 degrees to demonstrate significant improvement and to imrpove functional gait and moblity to be able to ascend/descend stairs.    Time  4    Period  Weeks    Status  New    Target Date  02/19/18      PT SHORT TERM GOAL #3   Title  Patient will have reduce edema of Rt knee with circumferential measure no greater than 0.5 cm larger than Lt knee at joint line to improve ROM for knee joint and muscle activation.    Time  4    Period  Weeks    Status  New        PT Long Term Goals - 01/22/18 1304      PT LONG TERM GOAL #1   Title  Patient will achieve 0-120 degrees of knee ROM or equal to Lt knee to imrpove functional symmetry of gait and to be able to ascend/descend stairs with reciprocol step pattern.     Time  8    Period  Weeks    Status  New    Target Date  03/19/18      PT LONG TERM GOAL #2   Title  Patient will have 4/5 or greater for all limited muscle groups on bil LE's to demonstrate improved functional LE strength.    Time  8    Period  Weeks    Status  New      PT LONG TERM GOAL #3   Title  Patient will ambulate at 1.0  m/s with no assistive device to demonstrate safe community ambulation speed and improve balance and WB tolerance ot Rt knee to be able to return to work with decreased fall risk.    Time  8    Period  Weeks    Status  New      PT LONG TERM GOAL #4   Title  Patient will improve FOTO to 39% or less limitation to indicate significant  improvement in function with mobility and daily activities and reduced self reported limitation related to Rt knee pain.     Time  8    Period  Weeks    Status  New        Plan - 02/12/18 0175    Clinical Impression Statement  Continued this session with focus on Rt knee mobility for fle/ext. Patient continues to be limited with flexion and cannot achieve full revolutions on stationary bike yet however ROM is improving. She performed prone knee flexion stretch and this was added to her HEP. AROM today is 2-99 degrees (last session 3-94) and patient continues to have mild swelling superior to Rt knee joint. Retrograde massage performed at EOS for edema and pain relief. She will continue to benefit from skilled PT interventions to address impairments and progress mobility for improve function and QOL.    Rehab Potential  Good    PT Frequency  3x / week    PT Duration  6 weeks    PT Treatment/Interventions  ADLs/Self Care Home Management;Aquatic Therapy;Cryotherapy;Electrical Stimulation;Iontophoresis 4mg /ml Dexamethasone;Moist Heat;DME Instruction;Gait training;Stair training;Functional mobility training;Therapeutic activities;Patient/family education;Therapeutic exercise;Balance training;Neuromuscular re-education;Cognitive remediation;Manual techniques;Passive range of motion;Taping    PT Next Visit Plan  Continue ROM based exercises and stretching until ROM is improved then begin balance and strengthening. Update HEP with functional strengthening.    PT Home Exercise Plan  Eval: heel slide, seated knee flexion stretch, quad set; 02/12/18 - prone quad stretch    Consulted and Agree with Plan of Care  Patient       Patient will benefit from skilled therapeutic intervention in order to improve the following deficits and impairments:  Abnormal gait, Decreased skin integrity, Pain, Decreased mobility, Decreased scar mobility, Decreased strength, Decreased range of motion, Decreased endurance,  Decreased activity tolerance, Decreased balance, Difficulty walking, Increased edema, Impaired flexibility, Hypomobility  Visit Diagnosis: Acute pain of right knee  Stiffness of right knee, not elsewhere classified  Muscle weakness (generalized)     Problem List Patient Active Problem List   Diagnosis Date Noted  . Sciatica of right side 12/07/2017  . Abnormal EKG 11/08/2017  . Insomnia 09/20/2017  . Family history of colon cancer 10/10/2016  . IGT (impaired glucose tolerance) 08/10/2015  . Hyperlipidemia LDL goal <100 02/11/2014  . Essential hypertension 07/01/2013  . Osteoarthritis of both knees 09/20/2012  . Asthma in adult 02/27/2012  . Seasonal allergies 02/27/2012  . Overweight (BMI 25.0-29.9) 11/05/2011  . Hypothyroidism 06/06/2007  . Migraine with aura 06/06/2007  . ALLERGIC RHINITIS, SEASONAL 06/06/2007    Kipp Brood, PT, DPT Physical Therapist with [facility]  02/12/2018 [facility] [address] [place], [place], [address] Phone: [phone]   Fax:  [phone]  Name: Gail Cox MRN: [id number] Date of Birth: October 26, 1965

## 2018-02-12 NOTE — Patient Instructions (Signed)
Step 1  Step 2  Prone Quadriceps Stretch with Strap reps: 3-5  sets: 1-2  hold: 30 seconds  daily: 1  weekly: 7 Setup  Begin lying on your front with your legs straight, holding the end of a strap that is looped around one foot.  Movement  Pull the end of the strap over your shoulder on the same side of your body, bending your knee, until you feel a gentle stretch in your thigh.  Tip  Do not let your low back arch during the stretch.

## 2018-02-14 ENCOUNTER — Ambulatory Visit (HOSPITAL_COMMUNITY): Payer: Managed Care, Other (non HMO)

## 2018-02-14 ENCOUNTER — Other Ambulatory Visit: Payer: Self-pay

## 2018-02-14 ENCOUNTER — Encounter (HOSPITAL_COMMUNITY): Payer: Self-pay

## 2018-02-14 DIAGNOSIS — M25561 Pain in right knee: Secondary | ICD-10-CM

## 2018-02-14 DIAGNOSIS — M25661 Stiffness of right knee, not elsewhere classified: Secondary | ICD-10-CM

## 2018-02-14 DIAGNOSIS — M6281 Muscle weakness (generalized): Secondary | ICD-10-CM

## 2018-02-14 NOTE — Therapy (Signed)
[facility] [address] [place], [place], [address] Phone: [phone]   Fax:  [phone]  Physical Therapy Treatment/Progress Note  Patient Details  Name: Gail Cox MRN: [id number] Date of Birth: 1965-02-24 Referring Provider (PT): Nestor Ramp, MD   Encounter Date: 02/14/2018   Progress Note Reporting Period 01/22/2018 to 02/14/2018  See note below for Objective Data and Assessment of Progress/Goals.    PT End of Session - 02/14/18 1000    Visit Number  9    Number of Visits  19    Date for PT Re-Evaluation  03/12/18    Authorization Type  [employer] Managed (no auth required, no visit limit)    Authorization Time Period  01/22/18 - 03/16/18    Authorization - Visit Number  9    Authorization - Number of Visits  10    PT Start Time  2355   pt late   PT Stop Time  1030    PT Time Calculation (min)  37 min    Activity Tolerance  Patient tolerated treatment well;No increased pain    Behavior During Therapy  WFL for tasks assessed/performed       Past Medical History:  Diagnosis Date  . Allergic rhinitis, seasonal   . Asthma   . Hypothyroidism   . Migraine headache     Past Surgical History:  Procedure Laterality Date  . BREAST CYST EXCISION Left   . CHOLECYSTECTOMY  1999  . COLONOSCOPY  09/21/2011   Procedure: COLONOSCOPY;  Surgeon: Rogene Houston, MD;  [facility];  Service: Endoscopy;  Laterality: N/A;  830  . COLONOSCOPY N/A 11/11/2016   Procedure: COLONOSCOPY;  Surgeon: Rogene Houston, MD;  [facility];  Service: Endoscopy;  Laterality: N/A;  1155  . CYSTECTOMY  1977   Right arm  . CYSTECTOMY  2007   Left breast  . OVARIAN CYST SURGERY  2003   Removed and uterus  . SPINE SURGERY  2016   cervical spine  . THYROIDECTOMY  2006    There were no vitals filed for this visit.  Subjective Assessment - 02/14/18 0956    Subjective  Patient reports she is doing well and has beent  ryingto stretch today. She reports bout 5/10 pain today.    Limitations  House hold activities;Walking;Standing;Lifting    Patient Stated Goals  to improve knee mobility and strength, and reduce pain to return to work    Currently in Pain?  Yes    Pain Score  5     Pain Location  Knee    Pain Orientation  Right    Pain Descriptors / Indicators  Aching;Tightness    Pain Type  Acute pain;Surgical pain    Pain Onset  More than a month ago    Pain Frequency  Intermittent    Aggravating Factors   walking, transitional movement    Pain Relieving Factors  ice    Effect of Pain on Daily Activities  limited with walking         [facility] PT Assessment - 02/14/18 0001      Assessment   Medical Diagnosis  Rt Partial Knee Replacement    Referring Provider (PT)  Nestor Ramp, MD    Onset Date/Surgical Date  01/18/18    Next MD Visit  02/22/2018      Precautions   Precautions  None      Restrictions   Weight Bearing Restrictions  No  Prior Function   Level of Independence  Independent    Vocation  Full time employment    Vocation Requirements  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     Leisure  resting; go out to church on sundays, "other than that I'm a loner       Cognition   Overall Cognitive Status  Within Functional Limits for tasks assessed      Observation/Other Assessments   Focus on Therapeutic Outcomes (FOTO)   35%   was 68% limited     Observation/Other Assessments-Edema    Edema  --   equal bil, Rt = 15", L t= 15.25"     AROM   Right/Left Knee  Right    Right Knee Extension  2   8   Right Knee Flexion  104   50     Strength   Right Hip Flexion  4+/5    Right Hip Extension  5/5    Right Hip ABduction  5/5    Left Hip Flexion  5/5    Left Hip Extension  5/5    Left Hip ABduction  5/5    Right Knee  Flexion  5/5    Right Knee Extension  5/5    Left Knee Flexion  5/5    Left Knee Extension  5/5    Right Ankle Dorsiflexion  5/5    Left Ankle Dorsiflexion  5/5      Ambulation/Gait   Gait velocity  1.34 m/s        [facility] Adult PT Treatment/Exercise - 02/14/18 0001      Knee/Hip Exercises: Stretches   Active Hamstring Stretch  Right;3 reps;30 seconds    Active Hamstring Stretch Limitations  12" box    Knee: Self-Stretch to increase Flexion  Right;3 reps;30 seconds    Knee: Self-Stretch Limitations  12" box      Knee/Hip Exercises: Aerobic   Stationary Bike  AROM for 4 minutes, seat 14, pt achieved full revolutions today      Knee/Hip Exercises: Standing   Heel Raises  Both;20 reps;3 seconds    Heel Raises Limitations  1x 20 toes raises on decline    Terminal Knee Extension  Right;2 sets;10 reps;Theraband    Theraband Level (Terminal Knee Extension)  Level 3 (Green)    Terminal Knee Extension Limitations  5" holds        PT Education - 02/14/18 1000    Education Details  Educated on progress towards goals and improvements since evaluation.     Person(s) Educated  Patient    Methods  Explanation    Comprehension  Verbalized understanding       PT Short Term Goals - 02/14/18 1004      PT SHORT TERM GOAL #1   Title  Pt will demo understanding of HEP, updatd PRN, to imrpove Rt knee mobility and LE strength to imrpove functional mobility to return to PLOF and work.    Time  2    Period  Weeks    Status  Achieved      PT SHORT TERM GOAL #2   Title  Patient will improve ROM to 3-100 degrees to demonstrate significant improvement and to imrpove functional gait and moblity to be able to ascend/descend stairs.    Baseline  3-104  Time  4    Period  Weeks    Status  Achieved      PT SHORT TERM GOAL #3   Title  Patient will have reduce edema of Rt knee with circumferential measure no greater than 0.5 cm larger than Lt knee at joint line to improve ROM for knee joint and  muscle activation.    Time  4    Period  Weeks    Status  Achieved        PT Long Term Goals - 02/14/18 1004      PT LONG TERM GOAL #1   Title  Patient will achieve 0-120 degrees of knee ROM or equal to Lt knee to imrpove functional symmetry of gait and to be able to ascend/descend stairs with reciprocol step pattern.     Time  8    Period  Weeks    Status  On-going      PT LONG TERM GOAL #2   Title  Patient will have 4/5 or greater for all limited muscle groups on bil LE's to demonstrate improved functional LE strength.    Time  8    Period  Weeks    Status  Achieved      PT LONG TERM GOAL #3   Title  Patient will ambulate at 1.0 m/s with no assistive device to demonstrate safe community ambulation speed and improve balance and WB tolerance ot Rt knee to be able to return to work with decreased fall risk.    Time  8    Period  Weeks    Status  Achieved      PT LONG TERM GOAL #4   Title  Patient will improve FOTO to 39% or less limitation to indicate significant improvement in function with mobility and daily activities and reduced self reported limitation related to Rt knee pain.     Time  8    Period  Weeks    Status  Achieved        Plan - 02/14/18 1000    Clinical Impression Statement  Re-assessment performed today and patient has made excellent progress towards all goals. She demonstrated significant improvement in Rt knee AROM and now has 3-10 degrees compared to 8-50 at evaluation. She also made major improvements in strength for bil LE's and is ambulating at 1.34 m/s indicating she is walking at a normal gait velocity. She has demonstrated excellent compliance with HEP and has progressed functional strengthening with no increase in pain. Her greatest impairment remains limited ROM and she will continue to benefit from skilled PT interventions to address impairments.     Rehab Potential  Good    PT Frequency  2x / week    PT Duration  6 weeks    PT  Treatment/Interventions  ADLs/Self Care Home Management;Aquatic Therapy;Cryotherapy;Electrical Stimulation;Iontophoresis 4mg /ml Dexamethasone;Moist Heat;DME Instruction;Gait training;Stair training;Functional mobility training;Therapeutic activities;Patient/family education;Therapeutic exercise;Balance training;Neuromuscular re-education;Cognitive remediation;Manual techniques;Passive range of motion;Taping    PT Next Visit Plan   Focus on ROM based exercises as this is her greatest limitation. Progress stretching in HEP until ROM is improved then begin balance and strengthening. Update HEP with functional strengthening.    PT Home Exercise Plan  Eval: heel slide, seated knee flexion stretch, quad set; 02/12/18 - prone quad stretch    Consulted and Agree with Plan of Care  Patient       Patient will benefit from skilled therapeutic intervention in order to improve the following deficits and impairments:  Abnormal gait, Decreased skin integrity, Pain, Decreased mobility, Decreased scar mobility, Decreased strength, Decreased range of motion, Decreased endurance, Decreased activity tolerance, Decreased balance, Difficulty walking, Increased edema, Impaired flexibility, Hypomobility  Visit Diagnosis: Acute pain of right knee  Stiffness of right knee, not elsewhere classified  Muscle weakness (generalized)     Problem List Patient Active Problem List   Diagnosis Date Noted  . Sciatica of right side 12/07/2017  . Abnormal EKG 11/08/2017  . Insomnia 09/20/2017  . Family history of colon cancer 10/10/2016  . IGT (impaired glucose tolerance) 08/10/2015  . Hyperlipidemia LDL goal <100 02/11/2014  . Essential hypertension 07/01/2013  . Osteoarthritis of both knees 09/20/2012  . Asthma in adult 02/27/2012  . Seasonal allergies 02/27/2012  . Overweight (BMI 25.0-29.9) 11/05/2011  . Hypothyroidism 06/06/2007  . Migraine with aura 06/06/2007  . ALLERGIC RHINITIS, SEASONAL 06/06/2007    Kipp Brood, PT, DPT, [facility] Physical Therapist with [facility]  02/14/2018 10:31 AM    [facility] [address] [place], [place], [address] Phone: [phone]   Fax:  [phone]  Name: Gail Cox MRN: [id number] Date of Birth: January 31, 1966

## 2018-02-16 ENCOUNTER — Encounter (HOSPITAL_COMMUNITY): Payer: Self-pay | Admitting: Physical Therapy

## 2018-02-19 ENCOUNTER — Encounter (HOSPITAL_COMMUNITY): Payer: Self-pay | Admitting: Physical Therapy

## 2018-02-19 ENCOUNTER — Ambulatory Visit (HOSPITAL_COMMUNITY): Payer: Managed Care, Other (non HMO) | Admitting: Physical Therapy

## 2018-02-19 DIAGNOSIS — M25661 Stiffness of right knee, not elsewhere classified: Secondary | ICD-10-CM

## 2018-02-19 DIAGNOSIS — M25561 Pain in right knee: Secondary | ICD-10-CM | POA: Diagnosis not present

## 2018-02-19 DIAGNOSIS — M6281 Muscle weakness (generalized): Secondary | ICD-10-CM

## 2018-02-19 NOTE — Therapy (Signed)
[place], [place], [address] Phone: [phone]   Fax:  [phone]  Physical Therapy Treatment  Patient Details  Name: Gail Cox MRN: [id number] Date of Birth: January 14, 1966 Referring Provider (PT): Nestor Ramp, MD   Encounter Date: 02/19/2018  PT End of Session - 02/19/18 0904    Visit Number  10    Number of Visits  19    Date for PT Re-Evaluation  03/12/18    Authorization Type  [employer] Managed (no auth required, no visit limit)    Authorization Time Period  01/22/18 - 03/16/18    Authorization - Visit Number  2    Authorization - Number of Visits  10    PT Start Time  0900    PT Stop Time  0942   4 minutes unbilled due to bike   PT Time Calculation (min)  42 min    Activity Tolerance  Patient tolerated treatment well;No increased pain    Behavior During Therapy  WFL for tasks assessed/performed       Past Medical History:  Diagnosis Date  . Allergic rhinitis, seasonal   . Asthma   . Hypothyroidism   . Migraine headache     Past Surgical History:  Procedure Laterality Date  . BREAST CYST EXCISION Left   . CHOLECYSTECTOMY  1999  . COLONOSCOPY  09/21/2011   Procedure: COLONOSCOPY;  Surgeon: Rogene Houston, MD;  [facility];  Service: Endoscopy;  Laterality: N/A;  830  . COLONOSCOPY N/A 11/11/2016   Procedure: COLONOSCOPY;  Surgeon: Rogene Houston, MD;  [facility]: [facility];  Service: Endoscopy;  Laterality: N/A;  1155  . CYSTECTOMY  1977   Right arm  . CYSTECTOMY  2007   Left breast  . OVARIAN CYST SURGERY  2003   Removed and uterus  . SPINE SURGERY  2016   cervical spine  . THYROIDECTOMY  2006    There were no vitals filed for this visit.  Subjective Assessment - 02/19/18 0903    Subjective  Patient stated that her right leg woke her up last night, however she stated she is just stiff presently and denied pain.     Limitations  House hold  activities;Walking;Standing;Lifting    Patient Stated Goals  to improve knee mobility and strength, and reduce pain to return to work    Currently in Pain?  No/denies                       [facility] Adult PT Treatment/Exercise - 02/19/18 0001      Knee/Hip Exercises: Stretches   Active Hamstring Stretch  Right;3 reps;30 seconds    Active Hamstring Stretch Limitations  12" box    Quad Stretch  Right;3 reps;30 seconds    Quad Stretch Limitations  prone with rope    Knee: Self-Stretch to increase Flexion  Right;3 reps;30 seconds    Knee: Self-Stretch Limitations  12" box    Gastroc Stretch  3 reps;30 seconds    Gastroc Stretch Limitations  slant board      Knee/Hip Exercises: Aerobic   Stationary Bike  AROM for 4 minutes, seat 13, pt achieved full revolutions today      Knee/Hip Exercises: Standing   Heel Raises  Both;20 reps;3 seconds    Heel Raises Limitations  1x 20 toes raises on decline    Terminal Knee Extension  Right;2 sets;10 reps;Theraband    Theraband Level (Terminal  Knee Extension)  Level 3 (Green)    Terminal Knee Extension Limitations  5" holds    Lateral Step Up  Right;Left;1 set;15 reps;Hand Hold: 1;Step Height: 4"    Forward Step Up  Right;1 set;15 reps;Hand Hold: 1;Step Height: 4";Left    Step Down  Right;Left;1 set;15 reps;Hand Hold: 2;Step Height: 4"      Knee/Hip Exercises: Prone   Hamstring Curl  1 set;15 reps    Hamstring Curl Limitations  5" holds             PT Education - 02/19/18 0904    Education Details  Discussed purpose and technique of interventions throughout session.     Person(s) Educated  Patient    Methods  Explanation    Comprehension  Verbalized understanding       PT Short Term Goals - 02/14/18 1004      PT SHORT TERM GOAL #1   Title  Pt will demo understanding of HEP, updatd PRN, to imrpove Rt knee mobility and LE strength to imrpove functional mobility to return to PLOF and work.    Time  2    Period  Weeks     Status  Achieved      PT SHORT TERM GOAL #2   Title  Patient will improve ROM to 3-100 degrees to demonstrate significant improvement and to imrpove functional gait and moblity to be able to ascend/descend stairs.    Baseline  3-104    Time  4    Period  Weeks    Status  Achieved      PT SHORT TERM GOAL #3   Title  Patient will have reduce edema of Rt knee with circumferential measure no greater than 0.5 cm larger than Lt knee at joint line to improve ROM for knee joint and muscle activation.    Time  4    Period  Weeks    Status  Achieved        PT Long Term Goals - 02/14/18 1004      PT LONG TERM GOAL #1   Title  Patient will achieve 0-120 degrees of knee ROM or equal to Lt knee to imrpove functional symmetry of gait and to be able to ascend/descend stairs with reciprocol step pattern.     Time  8    Period  Weeks    Status  On-going      PT LONG TERM GOAL #2   Title  Patient will have 4/5 or greater for all limited muscle groups on bil LE's to demonstrate improved functional LE strength.    Time  8    Period  Weeks    Status  Achieved      PT LONG TERM GOAL #3   Title  Patient will ambulate at 1.0 m/s with no assistive device to demonstrate safe community ambulation speed and improve balance and WB tolerance ot Rt knee to be able to return to work with decreased fall risk.    Time  8    Period  Weeks    Status  Achieved      PT LONG TERM GOAL #4   Title  Patient will improve FOTO to 39% or less limitation to indicate significant improvement in function with mobility and daily activities and reduced self reported limitation related to Rt knee pain.     Time  8    Period  Weeks    Status  Achieved  Plan - 02/19/18 0737    Clinical Impression Statement  This session continued with established plan of care. Patient continued to be able to achieve full revolutions on the bicycle today, this time able to perform with seat set on 13. This session progressed  patient with functional strengthening including step-ups onto a 4'' step. Patient did not report any increase in pain throughout session. Patient would benefit from continued skilled physical therapy in order to continue progressing towards functional goals.     Rehab Potential  Good    PT Frequency  2x / week    PT Duration  6 weeks    PT Treatment/Interventions  ADLs/Self Care Home Management;Aquatic Therapy;Cryotherapy;Electrical Stimulation;Iontophoresis 4mg /ml Dexamethasone;Moist Heat;DME Instruction;Gait training;Stair training;Functional mobility training;Therapeutic activities;Patient/family education;Therapeutic exercise;Balance training;Neuromuscular re-education;Cognitive remediation;Manual techniques;Passive range of motion;Taping    PT Next Visit Plan  Consider increasing step height to 6'' next session. Focus on ROM based exercises as this is her greatest limitation. Progress stretching in HEP until ROM is improved then begin balance and strengthening. Update HEP with functional strengthening.    PT Home Exercise Plan  Eval: heel slide, seated knee flexion stretch, quad set; 02/12/18 - prone quad stretch    Consulted and Agree with Plan of Care  Patient       Patient will benefit from skilled therapeutic intervention in order to improve the following deficits and impairments:  Abnormal gait, Decreased skin integrity, Pain, Decreased mobility, Decreased scar mobility, Decreased strength, Decreased range of motion, Decreased endurance, Decreased activity tolerance, Decreased balance, Difficulty walking, Increased edema, Impaired flexibility, Hypomobility  Visit Diagnosis: Acute pain of right knee  Stiffness of right knee, not elsewhere classified  Muscle weakness (generalized)     Problem List Patient Active Problem List   Diagnosis Date Noted  . Sciatica of right side 12/07/2017  . Abnormal EKG 11/08/2017  . Insomnia 09/20/2017  . Family history of colon cancer 10/10/2016  .  IGT (impaired glucose tolerance) 08/10/2015  . Hyperlipidemia LDL goal <100 02/11/2014  . Essential hypertension 07/01/2013  . Osteoarthritis of both knees 09/20/2012  . Asthma in adult 02/27/2012  . Seasonal allergies 02/27/2012  . Overweight (BMI 25.0-29.9) 11/05/2011  . Hypothyroidism 06/06/2007  . Migraine with aura 06/06/2007  . ALLERGIC RHINITIS, SEASONAL 06/06/2007   Clarene Critchley PT, DPT 9:46 AM, 02/19/18 [place] [address] [place], [place], [address] Phone: [phone]   Fax:  [phone]  Name: Gail Cox MRN: [id number] Date of Birth: Oct 05, 1965

## 2018-02-21 ENCOUNTER — Ambulatory Visit (HOSPITAL_COMMUNITY): Payer: Managed Care, Other (non HMO) | Admitting: Physical Therapy

## 2018-02-21 ENCOUNTER — Encounter (HOSPITAL_COMMUNITY): Payer: Self-pay | Admitting: Physical Therapy

## 2018-02-21 DIAGNOSIS — M25561 Pain in right knee: Secondary | ICD-10-CM | POA: Diagnosis not present

## 2018-02-21 DIAGNOSIS — M25661 Stiffness of right knee, not elsewhere classified: Secondary | ICD-10-CM

## 2018-02-21 DIAGNOSIS — M6281 Muscle weakness (generalized): Secondary | ICD-10-CM

## 2018-02-21 NOTE — Therapy (Signed)
[place], [place], [address] Phone: [phone]   Fax:  [phone]  Physical Therapy Treatment  Patient Details  Name: Gail Cox MRN: [id number] Date of Birth: 1965/03/11 Referring Provider (PT): Nestor Ramp, MD   Encounter Date: 02/21/2018  PT End of Session - 02/21/18 0951    Visit Number  11    Number of Visits  19    Date for PT Re-Evaluation  03/12/18    Authorization Type  [employer] Managed (no auth required, no visit limit)    Authorization Time Period  01/22/18 - 03/16/18    Authorization - Visit Number  3    Authorization - Number of Visits  10    PT Start Time  0946    PT Stop Time  1028    PT Time Calculation (min)  42 min    Activity Tolerance  Patient tolerated treatment well;No increased pain    Behavior During Therapy  WFL for tasks assessed/performed       Past Medical History:  Diagnosis Date  . Allergic rhinitis, seasonal   . Asthma   . Hypothyroidism   . Migraine headache     Past Surgical History:  Procedure Laterality Date  . BREAST CYST EXCISION Left   . CHOLECYSTECTOMY  1999  . COLONOSCOPY  09/21/2011   Procedure: COLONOSCOPY;  Surgeon: Rogene Houston, MD;  [facility];  Service: Endoscopy;  Laterality: N/A;  830  . COLONOSCOPY N/A 11/11/2016   Procedure: COLONOSCOPY;  Surgeon: Rogene Houston, MD;  [facility];  Service: Endoscopy;  Laterality: N/A;  1155  . CYSTECTOMY  1977   Right arm  . CYSTECTOMY  2007   Left breast  . OVARIAN CYST SURGERY  2003   Removed and uterus  . SPINE SURGERY  2016   cervical spine  . THYROIDECTOMY  2006    There were no vitals filed for this visit.  Subjective Assessment - 02/21/18 0948    Subjective  Patient stated that she is not having any pain today, just stiffness.     Limitations  House hold activities;Walking;Standing;Lifting    Patient Stated Goals  to improve knee mobility and strength, and reduce  pain to return to work    Currently in Pain?  No/denies                       [facility] Adult PT Treatment/Exercise - 02/21/18 0001      Knee/Hip Exercises: Stretches   Active Hamstring Stretch  Right;3 reps;30 seconds    Active Hamstring Stretch Limitations  12" box    Quad Stretch  Right;3 reps;30 seconds    Quad Stretch Limitations  prone with rope    Knee: Self-Stretch to increase Flexion  Right;3 reps;30 seconds    Knee: Self-Stretch Limitations  12" box    Gastroc Stretch  3 reps;30 seconds    Gastroc Stretch Limitations  slant board      Knee/Hip Exercises: Aerobic   Stationary Bike  AROM for 3 minutes, seat 12; full revolutions forward      Knee/Hip Exercises: Standing   Heel Raises  Both;20 reps;3 seconds    Heel Raises Limitations  1x 20 toes raises on decline    Terminal Knee Extension  Right;2 sets;10 reps;Theraband    Theraband Level (Terminal Knee Extension)  Level 3 (Green)    Terminal Knee Extension Limitations  5" holds  Lateral Step Up  Right;Left;1 set;15 reps;Hand Hold: 1;Step Height: 6"    Forward Step Up  Right;1 set;15 reps;Hand Hold: 1;Left;Step Height: 6"    Step Down  Right;Left;1 set;15 reps;Hand Hold: 2;Step Height: 6"    Functional Squat  10 reps      Knee/Hip Exercises: Supine   Heel Slides  Right;15 reps    Knee Extension  AROM    Knee Extension Limitations  1    Knee Flexion  AROM    Knee Flexion Limitations  115      Knee/Hip Exercises: Prone   Hamstring Curl  1 set;20 reps    Hamstring Curl Limitations  5" holds             PT Education - 02/21/18 0950    Education Details  Discussed purpose and technique of interventions throughout session.     Person(s) Educated  Patient    Methods  Explanation    Comprehension  Verbalized understanding       PT Short Term Goals - 02/14/18 1004      PT SHORT TERM GOAL #1   Title  Pt will demo understanding of HEP, updatd PRN, to imrpove Rt knee mobility and LE strength to  imrpove functional mobility to return to PLOF and work.    Time  2    Period  Weeks    Status  Achieved      PT SHORT TERM GOAL #2   Title  Patient will improve ROM to 3-100 degrees to demonstrate significant improvement and to imrpove functional gait and moblity to be able to ascend/descend stairs.    Baseline  3-104    Time  4    Period  Weeks    Status  Achieved      PT SHORT TERM GOAL #3   Title  Patient will have reduce edema of Rt knee with circumferential measure no greater than 0.5 cm larger than Lt knee at joint line to improve ROM for knee joint and muscle activation.    Time  4    Period  Weeks    Status  Achieved        PT Long Term Goals - 02/14/18 1004      PT LONG TERM GOAL #1   Title  Patient will achieve 0-120 degrees of knee ROM or equal to Lt knee to imrpove functional symmetry of gait and to be able to ascend/descend stairs with reciprocol step pattern.     Time  8    Period  Weeks    Status  On-going      PT LONG TERM GOAL #2   Title  Patient will have 4/5 or greater for all limited muscle groups on bil LE's to demonstrate improved functional LE strength.    Time  8    Period  Weeks    Status  Achieved      PT LONG TERM GOAL #3   Title  Patient will ambulate at 1.0 m/s with no assistive device to demonstrate safe community ambulation speed and improve balance and WB tolerance ot Rt knee to be able to return to work with decreased fall risk.    Time  8    Period  Weeks    Status  Achieved      PT LONG TERM GOAL #4   Title  Patient will improve FOTO to 39% or less limitation to indicate significant improvement in function with mobility and daily activities and reduced self  reported limitation related to Rt knee pain.     Time  8    Period  Weeks    Status  Achieved            Plan - 02/21/18 1039    Clinical Impression Statement  This session continued to progress patient with functional strengthening. This session increased step height to 6  inches. Also, continued with functional squats this session to improve patient's lower extremity strength. Patient's knee AROM ranged from 1-115 degrees this session. Patient with some increased discomfort at end of session, therapist recommending ice following therapy.     Rehab Potential  Good    PT Frequency  2x / week    PT Duration  6 weeks    PT Treatment/Interventions  ADLs/Self Care Home Management;Aquatic Therapy;Cryotherapy;Electrical Stimulation;Iontophoresis 4mg /ml Dexamethasone;Moist Heat;DME Instruction;Gait training;Stair training;Functional mobility training;Therapeutic activities;Patient/family education;Therapeutic exercise;Balance training;Neuromuscular re-education;Cognitive remediation;Manual techniques;Passive range of motion;Taping    PT Next Visit Plan  Focus on knee flexion ROM more than knee extension. Consider adding lunges next session and contract relax to improve ROM. Focus on ROM based exercises as this is her greatest limitation. Progress stretching in HEP until ROM is improved then begin balance and strengthening. Update HEP with functional strengthening.    PT Home Exercise Plan  Eval: heel slide, seated knee flexion stretch, quad set; 02/12/18 - prone quad stretch    Consulted and Agree with Plan of Care  Patient       Patient will benefit from skilled therapeutic intervention in order to improve the following deficits and impairments:  Abnormal gait, Decreased skin integrity, Pain, Decreased mobility, Decreased scar mobility, Decreased strength, Decreased range of motion, Decreased endurance, Decreased activity tolerance, Decreased balance, Difficulty walking, Increased edema, Impaired flexibility, Hypomobility  Visit Diagnosis: Acute pain of right knee  Stiffness of right knee, not elsewhere classified  Muscle weakness (generalized)     Problem List Patient Active Problem List   Diagnosis Date Noted  . Sciatica of right side 12/07/2017  . Abnormal EKG  11/08/2017  . Insomnia 09/20/2017  . Family history of colon cancer 10/10/2016  . IGT (impaired glucose tolerance) 08/10/2015  . Hyperlipidemia LDL goal <100 02/11/2014  . Essential hypertension 07/01/2013  . Osteoarthritis of both knees 09/20/2012  . Asthma in adult 02/27/2012  . Seasonal allergies 02/27/2012  . Overweight (BMI 25.0-29.9) 11/05/2011  . Hypothyroidism 06/06/2007  . Migraine with aura 06/06/2007  . ALLERGIC RHINITIS, SEASONAL 06/06/2007   Clarene Critchley PT, DPT 10:43 AM, 02/21/18 [place] [address] [place], [place], [address] Phone: [phone]   Fax:  [phone]  Name: KENORA SPAYD MRN: [id number] Date of Birth: 1965-09-23

## 2018-02-23 ENCOUNTER — Encounter (HOSPITAL_COMMUNITY): Payer: Self-pay

## 2018-02-26 ENCOUNTER — Ambulatory Visit (HOSPITAL_COMMUNITY): Payer: Managed Care, Other (non HMO)

## 2018-02-26 DIAGNOSIS — M25561 Pain in right knee: Secondary | ICD-10-CM | POA: Diagnosis not present

## 2018-02-26 DIAGNOSIS — M6281 Muscle weakness (generalized): Secondary | ICD-10-CM

## 2018-02-26 DIAGNOSIS — M25661 Stiffness of right knee, not elsewhere classified: Secondary | ICD-10-CM

## 2018-02-26 NOTE — Therapy (Signed)
[place], [place], [address] Phone: [phone]   Fax:  [phone]  Physical Therapy Treatment  Patient Details  Name: Gail Cox MRN: [id number] Date of Birth: Sep 24, 1965 Referring Provider (PT): Nestor Ramp, MD   Encounter Date: 02/26/2018  PT End of Session - 02/26/18 1021    Visit Number  12    Number of Visits  19    Date for PT Re-Evaluation  03/12/18    Authorization Type  [employer] Managed (no auth required, no visit limit)    Authorization Time Period  01/22/18 - 03/16/18    Authorization - Visit Number  4    Authorization - Number of Visits  10    PT Start Time  0948    PT Stop Time  1030    PT Time Calculation (min)  42 min    Activity Tolerance  Patient tolerated treatment well;No increased pain    Behavior During Therapy  WFL for tasks assessed/performed       Past Medical History:  Diagnosis Date  . Allergic rhinitis, seasonal   . Asthma   . Hypothyroidism   . Migraine headache     Past Surgical History:  Procedure Laterality Date  . BREAST CYST EXCISION Left   . CHOLECYSTECTOMY  1999  . COLONOSCOPY  09/21/2011   Procedure: COLONOSCOPY;  Surgeon: Rogene Houston, MD;  [facility];  Service: Endoscopy;  Laterality: N/A;  830  . COLONOSCOPY N/A 11/11/2016   Procedure: COLONOSCOPY;  Surgeon: Rogene Houston, MD;  [facility];  Service: Endoscopy;  Laterality: N/A;  1155  . CYSTECTOMY  1977   Right arm  . CYSTECTOMY  2007   Left breast  . OVARIAN CYST SURGERY  2003   Removed and uterus  . SPINE SURGERY  2016   cervical spine  . THYROIDECTOMY  2006    There were no vitals filed for this visit.  Subjective Assessment - 02/26/18 1020    Subjective  Patient stated that she is not having any pain today, just stiffness again today.    Limitations  House hold activities;Walking;Standing;Lifting    Patient Stated Goals  to improve knee mobility and strength,  and reduce pain to return to work                       [facility] Adult PT Treatment/Exercise - 02/26/18 0001      Knee/Hip Exercises: Stretches   Active Hamstring Stretch  Right;3 reps;30 seconds    Active Hamstring Stretch Limitations  12" box    Quad Stretch  Right;3 reps;30 seconds    Quad Stretch Limitations  prone with rope    Knee: Self-Stretch to increase Flexion  Right;3 reps;30 seconds    Knee: Self-Stretch Limitations  12" box    Gastroc Stretch  3 reps;30 seconds    Gastroc Stretch Limitations  slant board      Knee/Hip Exercises: Aerobic   Stationary Bike  AROM for 3 minutes, seat 11; full revolutions forward      Knee/Hip Exercises: Standing   Heel Raises  Both;20 reps;3 seconds    Heel Raises Limitations  1x 20 toes raises on decline    Terminal Knee Extension  Right;2 sets;10 reps;Theraband    Theraband Level (Terminal Knee Extension)  Level 3 (Green)    Terminal Knee Extension Limitations  5" holds    Lateral Step Up  Right;Left;1 set;15 reps;Hand  Hold: 1;Step Height: 6"    Forward Step Up  Right;1 set;15 reps;Hand Hold: 1;Left;Step Height: 6"    Step Down  Right;Left;1 set;15 reps;Hand Hold: 2;Step Height: 6"    Functional Squat  15 reps      Knee/Hip Exercises: Supine   Heel Slides  Right;15 reps    Knee Extension  AROM    Knee Extension Limitations  4    Knee Flexion  AROM    Knee Flexion Limitations  122      Knee/Hip Exercises: Prone   Hamstring Curl  1 set;20 reps    Hamstring Curl Limitations  5" holds; 2# ankle weight             PT Education - 02/26/18 1231    Education Details  Discussed purpose and technique of interventions throughout session.     Person(s) Educated  Patient    Methods  Explanation    Comprehension  Verbalized understanding       PT Short Term Goals - 02/26/18 1022      PT SHORT TERM GOAL #1   Title  Pt will demo understanding of HEP, updatd PRN, to imrpove Rt knee mobility and LE strength to imrpove  functional mobility to return to PLOF and work.    Time  2    Period  Weeks    Status  Achieved      PT SHORT TERM GOAL #2   Title  Patient will improve ROM to 3-100 degrees to demonstrate significant improvement and to imrpove functional gait and moblity to be able to ascend/descend stairs.    Baseline  3-104    Time  4    Period  Weeks    Status  Achieved      PT SHORT TERM GOAL #3   Title  Patient will have reduce edema of Rt knee with circumferential measure no greater than 0.5 cm larger than Lt knee at joint line to improve ROM for knee joint and muscle activation.    Time  4    Period  Weeks    Status  Achieved        PT Long Term Goals - 02/26/18 1022      PT LONG TERM GOAL #1   Title  Patient will achieve 0-120 degrees of knee ROM or equal to Lt knee to imrpove functional symmetry of gait and to be able to ascend/descend stairs with reciprocol step pattern.     Time  8    Period  Weeks    Status  On-going      PT LONG TERM GOAL #2   Title  Patient will have 4/5 or greater for all limited muscle groups on bil LE's to demonstrate improved functional LE strength.    Time  8    Period  Weeks    Status  Achieved      PT LONG TERM GOAL #3   Title  Patient will ambulate at 1.0 m/s with no assistive device to demonstrate safe community ambulation speed and improve balance and WB tolerance ot Rt knee to be able to return to work with decreased fall risk.    Time  8    Period  Weeks    Status  Achieved      PT LONG TERM GOAL #4   Title  Patient will improve FOTO to 39% or less limitation to indicate significant improvement in function with mobility and daily activities and reduced self reported limitation related to  Rt knee pain.     Time  8    Period  Weeks    Status  Achieved            Plan - 02/26/18 1022    Clinical Impression Statement  This session continued to focus on AROM of right knee, also progressing patient with functional strengthening. This session  continued step height to 6 inches. Also, continued with functional squats this session to improve patient's lower extremity strength. Added 2# ankle weight for prone hamstring curl. Patient's knee AROM ranged from 4-122 degrees this session; was 1-115 last session. Continue with current plan, progressing as able.     Rehab Potential  Good    PT Frequency  2x / week    PT Duration  6 weeks    PT Treatment/Interventions  ADLs/Self Care Home Management;Aquatic Therapy;Cryotherapy;Electrical Stimulation;Iontophoresis 4mg /ml Dexamethasone;Moist Heat;DME Instruction;Gait training;Stair training;Functional mobility training;Therapeutic activities;Patient/family education;Therapeutic exercise;Balance training;Neuromuscular re-education;Cognitive remediation;Manual techniques;Passive range of motion;Taping    PT Next Visit Plan  Focus on knee flexion and extension 4-122 last session. Consider adding lunges next session. VMO strengthening in closed and open chain.  Progress stretching in HEP until ROM is improved then begin balance and strengthening. Update HEP with functional strengthening.    PT Home Exercise Plan  Eval: heel slide, seated knee flexion stretch, quad set; 02/12/18 - prone quad stretch    Consulted and Agree with Plan of Care  Patient       Patient will benefit from skilled therapeutic intervention in order to improve the following deficits and impairments:  Abnormal gait, Decreased skin integrity, Pain, Decreased mobility, Decreased scar mobility, Decreased strength, Decreased range of motion, Decreased endurance, Decreased activity tolerance, Decreased balance, Difficulty walking, Increased edema, Impaired flexibility, Hypomobility  Visit Diagnosis: Acute pain of right knee  Stiffness of right knee, not elsewhere classified  Muscle weakness (generalized)     Problem List Patient Active Problem List   Diagnosis Date Noted  . Sciatica of right side 12/07/2017  . Abnormal EKG  11/08/2017  . Insomnia 09/20/2017  . Family history of colon cancer 10/10/2016  . IGT (impaired glucose tolerance) 08/10/2015  . Hyperlipidemia LDL goal <100 02/11/2014  . Essential hypertension 07/01/2013  . Osteoarthritis of both knees 09/20/2012  . Asthma in adult 02/27/2012  . Seasonal allergies 02/27/2012  . Overweight (BMI 25.0-29.9) 11/05/2011  . Hypothyroidism 06/06/2007  . Migraine with aura 06/06/2007  . ALLERGIC RHINITIS, SEASONAL 06/06/2007    Floria Raveling. Hartnett-Rands, MS, PT [facility] [place] #[id number] 02/26/2018, 12:35 PM  [place] [address] [place], [place], [address] Phone: [phone]   Fax:  [phone]  Name: Gail Cox MRN: [id number] Date of Birth: [birth year]

## 2018-02-28 ENCOUNTER — Ambulatory Visit (HOSPITAL_COMMUNITY): Payer: Managed Care, Other (non HMO)

## 2018-02-28 ENCOUNTER — Encounter (HOSPITAL_COMMUNITY): Payer: Self-pay

## 2018-02-28 ENCOUNTER — Other Ambulatory Visit: Payer: Self-pay

## 2018-02-28 DIAGNOSIS — M25661 Stiffness of right knee, not elsewhere classified: Secondary | ICD-10-CM

## 2018-02-28 DIAGNOSIS — M6281 Muscle weakness (generalized): Secondary | ICD-10-CM

## 2018-02-28 DIAGNOSIS — M25561 Pain in right knee: Secondary | ICD-10-CM | POA: Diagnosis not present

## 2018-02-28 NOTE — Patient Instructions (Signed)
Supine Quad Set reps: 15-20 sets: 2-3 hold: 5 seconds daily: 1  weekly: 7      Exercise image step 1   Exercise image step 2 Setup  Begin lying on your back with one knee bent and your other leg straight with your knee resting on a towel roll. Movement  Gently squeeze your thigh muscles, pushing the back of your knee down into the towel. Tip  Make sure to keep your back flat against the floor during the exercise.

## 2018-02-28 NOTE — Therapy (Signed)
[place], [place], [address] Phone: [phone]   Fax:  [phone]  Physical Therapy Treatment  Patient Details  Name: Gail Cox MRN: [id number] Date of Birth: [birth year] Referring Provider (PT): Nestor Ramp, MD   Encounter Date: 02/28/2018  PT End of Session - 02/28/18 0954    Visit Number  13    Number of Visits  19    Date for PT Re-Evaluation  03/12/18    Authorization Type  [employer] Managed (no auth required, no visit limit)    Authorization Time Period  01/22/18 - 03/16/18    Authorization - Visit Number  5    Authorization - Number of Visits  10    PT Start Time  0950    PT Stop Time  1030    PT Time Calculation (min)  40 min    Activity Tolerance  Patient tolerated treatment well    Behavior During Therapy  [facility] for tasks assessed/performed       Past Medical History:  Diagnosis Date  . Allergic rhinitis, seasonal   . Asthma   . Hypothyroidism   . Migraine headache     Past Surgical History:  Procedure Laterality Date  . BREAST CYST EXCISION Left   . CHOLECYSTECTOMY  1999  . COLONOSCOPY  09/21/2011   Procedure: COLONOSCOPY;  Surgeon: Rogene Houston, MD;  [facility];  Service: Endoscopy;  Laterality: N/A;  830  . COLONOSCOPY N/A 11/11/2016   Procedure: COLONOSCOPY;  Surgeon: Rogene Houston, MD;  [facility];  Service: Endoscopy;  Laterality: N/A;  1155  . CYSTECTOMY  1977   Right arm  . CYSTECTOMY  2007   Left breast  . OVARIAN CYST SURGERY  2003   Removed and uterus  . SPINE SURGERY  2016   cervical spine  . THYROIDECTOMY  2006    There were no vitals filed for this visit.  Subjective Assessment - 02/28/18 0953    Subjective  Patient reports she is doing well and is doing her exercises and walking regularly. She reports her pain is improved but lately she has noticed some pain in her Rt shin. She reports it goes away after stretching and walking  some. It is aching around 5/10 right now.     Limitations  House hold activities;Walking;Standing;Lifting    Patient Stated Goals  to improve knee mobility and strength, and reduce pain to return to work    Currently in Pain?  Yes    Pain Score  5     Pain Location  Leg    Pain Orientation  Anterior;Right;Lower    Pain Descriptors / Indicators  Aching    Pain Type  Acute pain    Pain Onset  In the past 7 days    Pain Frequency  Intermittent    Aggravating Factors   unsure    Pain Relieving Factors  walking, stretching, exercise    Effect of Pain on Daily Activities  minimal       [facility] Adult PT Treatment/Exercise - 02/28/18 0001      Knee/Hip Exercises: Stretches   Active Hamstring Stretch  Right;3 reps;30 seconds    Active Hamstring Stretch Limitations  12" box    Gastroc Stretch  3 reps;30 seconds    Gastroc Stretch Limitations  slant board      Knee/Hip Exercises: Aerobic   Stationary Bike  AROM for 4 minutes, seat 10; full revolutions forward  Knee/Hip Exercises: Machines for Strengthening   Total Gym Leg Press  Body Craft: leg press 1x 12 reps bil LE (50lbs), 1x 12 reps Rt LE (20lbs)      Knee/Hip Exercises: Standing   Forward Lunges  Both;1 set;10 reps;3 seconds    Side Lunges  Both;1 set;10 reps   towel slide   Terminal Knee Extension  Right;Theraband;2 sets;15 reps    Theraband Level (Terminal Knee Extension)  Level 4 (Blue)    Terminal Knee Extension Limitations  5" holds    Lateral Step Up  Both;1 set;15 reps;Hand Hold: 1;Step Height: 6"    Step Down  Both;2 sets;15 reps;Hand Hold: 2;Step Height: 6"    Step Down Limitations  slow eccentric lowering      Knee/Hip Exercises: Supine   Quad Sets  Right;1 set;5 reps    Quad Sets Limitations  for HEP, towel under knee    Knee Extension  AROM    Knee Extension Limitations  4    Knee Flexion  AROM    Knee Flexion Limitations  118       PT Education - 02/28/18 0954    Education Details  Educated on reduced  quad activation and updated HEP to address.     Person(s) Educated  Patient    Methods  Explanation;Handout    Comprehension  Verbalized understanding       PT Short Term Goals - 02/26/18 1022      PT SHORT TERM GOAL #1   Title  Pt will demo understanding of HEP, updatd PRN, to imrpove Rt knee mobility and LE strength to imrpove functional mobility to return to PLOF and work.    Time  2    Period  Weeks    Status  Achieved      PT SHORT TERM GOAL #2   Title  Patient will improve ROM to 3-100 degrees to demonstrate significant improvement and to imrpove functional gait and moblity to be able to ascend/descend stairs.    Baseline  3-104    Time  4    Period  Weeks    Status  Achieved      PT SHORT TERM GOAL #3   Title  Patient will have reduce edema of Rt knee with circumferential measure no greater than 0.5 cm larger than Lt knee at joint line to improve ROM for knee joint and muscle activation.    Time  4    Period  Weeks    Status  Achieved        PT Long Term Goals - 02/26/18 1022      PT LONG TERM GOAL #1   Title  Patient will achieve 0-120 degrees of knee ROM or equal to Lt knee to imrpove functional symmetry of gait and to be able to ascend/descend stairs with reciprocol step pattern.     Time  8    Period  Weeks    Status  On-going      PT LONG TERM GOAL #2   Title  Patient will have 4/5 or greater for all limited muscle groups on bil LE's to demonstrate improved functional LE strength.    Time  8    Period  Weeks    Status  Achieved      PT LONG TERM GOAL #3   Title  Patient will ambulate at 1.0 m/s with no assistive device to demonstrate safe community ambulation speed and improve balance and WB tolerance ot Rt knee to be  able to return to work with decreased fall risk.    Time  8    Period  Weeks    Status  Achieved      PT LONG TERM GOAL #4   Title  Patient will improve FOTO to 39% or less limitation to indicate significant improvement in function with  mobility and daily activities and reduced self reported limitation related to Rt knee pain.     Time  8    Period  Weeks    Status  Achieved        Plan - 02/28/18 0956    Clinical Impression Statement  Continued to progress ROM exercise and functional strengthening this session. Patient initiated forward and lateral lunges with minimal cuing required. She had difficulty with Lt lateral lunge/Rt SL squat due to reduced eccentric control. She continued with step downs for eccentric quad strengthening to address this. With ROM measurement patient noted to have reduced superior patella translation indicating inadequate Rt quadriceps activation. This improved with quad set using towel under knee for tactile cues. Added exercise to HEP to facilitate improved knee extension. She will continue to benefit from skilled PT interventions to address impairments and improve mobility to return to work.    Rehab Potential  Good    PT Frequency  2x / week    PT Duration  6 weeks    PT Treatment/Interventions  ADLs/Self Care Home Management;Aquatic Therapy;Cryotherapy;Electrical Stimulation;Iontophoresis 4mg /ml Dexamethasone;Moist Heat;DME Instruction;Gait training;Stair training;Functional mobility training;Therapeutic activities;Patient/family education;Therapeutic exercise;Balance training;Neuromuscular re-education;Cognitive remediation;Manual techniques;Passive range of motion;Taping    PT Next Visit Plan  Continue with ROM exercise and perform exercise to facilitate Rt quad activation. Progress lunges next session to step. VMO strengthening in closed and open chain.  Progress stretching in HEP until ROM is improved then begin balance and strengthening. Update HEP with functional strengthening.    PT Home Exercise Plan  Eval: heel slide, seated knee flexion stretch, quad set; 02/12/18 - prone quad stretch    Consulted and Agree with Plan of Care  Patient       Patient will benefit from skilled therapeutic  intervention in order to improve the following deficits and impairments:  Abnormal gait, Decreased skin integrity, Pain, Decreased mobility, Decreased scar mobility, Decreased strength, Decreased range of motion, Decreased endurance, Decreased activity tolerance, Decreased balance, Difficulty walking, Increased edema, Impaired flexibility, Hypomobility  Visit Diagnosis: Acute pain of right knee  Stiffness of right knee, not elsewhere classified  Muscle weakness (generalized)     Problem List Patient Active Problem List   Diagnosis Date Noted  . Sciatica of right side 12/07/2017  . Abnormal EKG 11/08/2017  . Insomnia 09/20/2017  . Family history of colon cancer 10/10/2016  . IGT (impaired glucose tolerance) 08/10/2015  . Hyperlipidemia LDL goal <100 02/11/2014  . Essential hypertension 07/01/2013  . Osteoarthritis of both knees 09/20/2012  . Asthma in adult 02/27/2012  . Seasonal allergies 02/27/2012  . Overweight (BMI 25.0-29.9) 11/05/2011  . Hypothyroidism 06/06/2007  . Migraine with aura 06/06/2007  . ALLERGIC RHINITIS, SEASONAL 06/06/2007    Kipp Brood, PT, DPT, [facility] Physical Therapist with [facility]  02/28/2018 12:11 PM    [facility] [place] [address] [place], [place], [address] Phone: [phone]   Fax:  [phone]  Name: Gail Cox MRN: [id number] Date of Birth: December 03, 1965

## 2018-03-02 ENCOUNTER — Encounter (HOSPITAL_COMMUNITY): Payer: Self-pay

## 2018-03-02 ENCOUNTER — Telehealth (HOSPITAL_COMMUNITY): Payer: Self-pay

## 2018-03-02 NOTE — Telephone Encounter (Signed)
Pt will go back to work on Monday per MD and request to be put on hold. She will call us back to let us know when to D/C

## 2018-03-05 ENCOUNTER — Encounter (HOSPITAL_COMMUNITY): Payer: Self-pay

## 2018-03-07 ENCOUNTER — Encounter (HOSPITAL_COMMUNITY): Payer: Self-pay

## 2018-03-09 ENCOUNTER — Encounter (HOSPITAL_COMMUNITY): Payer: Self-pay

## 2018-03-12 ENCOUNTER — Encounter (HOSPITAL_COMMUNITY): Payer: Self-pay

## 2018-03-14 ENCOUNTER — Encounter (HOSPITAL_COMMUNITY): Payer: Self-pay

## 2018-03-16 ENCOUNTER — Encounter (HOSPITAL_COMMUNITY): Payer: Self-pay

## 2018-03-20 ENCOUNTER — Encounter: Payer: Self-pay | Admitting: Family Medicine

## 2018-03-20 ENCOUNTER — Ambulatory Visit (INDEPENDENT_AMBULATORY_CARE_PROVIDER_SITE_OTHER): Payer: Managed Care, Other (non HMO) | Admitting: Family Medicine

## 2018-03-20 ENCOUNTER — Ambulatory Visit (HOSPITAL_COMMUNITY)
Admission: RE | Admit: 2018-03-20 | Discharge: 2018-03-20 | Disposition: A | Discharge status: Home / Self Care | Payer: Managed Care, Other (non HMO) | Source: Ambulatory Visit | Attending: Family Medicine | Admitting: Family Medicine

## 2018-03-20 ENCOUNTER — Telehealth (HOSPITAL_COMMUNITY): Payer: Self-pay

## 2018-03-20 ENCOUNTER — Other Ambulatory Visit (HOSPITAL_COMMUNITY)
Admission: RE | Admit: 2018-03-20 | Discharge: 2018-03-20 | Disposition: A | Discharge status: Home / Self Care | Payer: Managed Care, Other (non HMO) | Attending: *Deleted | Admitting: *Deleted

## 2018-03-20 VITALS — BP 144/82 | HR 77 | Resp 12 | Ht 65.0 in | Wt 174.0 lb

## 2018-03-20 DIAGNOSIS — E663 Overweight: Secondary | ICD-10-CM

## 2018-03-20 DIAGNOSIS — R3989 Other symptoms and signs involving the genitourinary system: Secondary | ICD-10-CM | POA: Insufficient documentation

## 2018-03-20 DIAGNOSIS — R319 Hematuria, unspecified: Secondary | ICD-10-CM | POA: Diagnosis present

## 2018-03-20 DIAGNOSIS — R7302 Impaired glucose tolerance (oral): Secondary | ICD-10-CM | POA: Diagnosis not present

## 2018-03-20 DIAGNOSIS — I10 Essential (primary) hypertension: Secondary | ICD-10-CM

## 2018-03-20 DIAGNOSIS — E785 Hyperlipidemia, unspecified: Secondary | ICD-10-CM

## 2018-03-20 DIAGNOSIS — E038 Other specified hypothyroidism: Secondary | ICD-10-CM

## 2018-03-20 LAB — POCT URINALYSIS DIP (CLINITEK)
Bilirubin, UA: NEGATIVE
Glucose, UA: NEGATIVE mg/dL
Ketones, POC UA: NEGATIVE mg/dL
NITRITE UA: NEGATIVE
SPEC GRAV UA: 1.02 (ref 1.010–1.025)
UROBILINOGEN UA: 0.2 U/dL
pH, UA: 7 (ref 5.0–8.0)

## 2018-03-20 MED ORDER — CIPROFLOXACIN HCL 500 MG PO TABS: 500.0000 mg | ORAL_TABLET | Freq: Two times a day (BID) | ORAL | 0 refills | Status: DC

## 2018-03-20 NOTE — Patient Instructions (Addendum)
Keep April appt as before  Please get fasting labs 1 week prior to appt , lipid, cmp and eGFr,TSH and hBA1C  You are treated for presumed urinary tract infection, 3 day course of ciprofloxacin is at your pharmacy.Drink a lot of water  we will call with results of urine test as they become available  Pls get Xray today , this may show up a kidney stone, you do have blood in the urine  If no clear answer and recovery, you will be referred to a Urologist

## 2018-03-20 NOTE — Telephone Encounter (Signed)
Pt came by to pay her bill and request medical records of her care here from 12/23/1/29/2020. NF 03/20/2018

## 2018-03-20 NOTE — Progress Notes (Signed)
Gail Cox     MRN: [id number]      DOB: 1965/12/07   HPI Gail Cox is here for follow up and re-evaluation of chronic medical conditions, medication management and review of any available recent lab and radiology data.  Preventive health is updated, specifically  Cancer screening and Immunization.   Had left knee surgery last fall and is back at work. The PT denies any adverse reactions to current medications since the last visit.  Last week Thursday ( 6 days ago) when she voided that night she noted her urine was dark, this happened for on Thursday and Friday, eased up on Saturday and Sunday, however yesterday and today again dark   C/o left flank pain intermittently, has past h/o kidney stone on the right  ROS Denies recent fever or chills. Denies sinus pressure, nasal congestion, ear pain or sore throat. Denies chest congestion, productive cough or wheezing. Denies chest pains, palpitations and leg swelling Denies  nausea, vomiting,diarrhea or constipation.   Denies dysuria, frequency, hesitancy or incontinence.c/o dark urine Denies joint pain, swelling and limitation in mobility. Denies headaches, seizures, numbness, or tingling. Denies depression, anxiety or insomnia. Denies skin break down or rash.   PE  BP 140/82   Pulse 77   Resp 12   Ht 5\' 5"  (1.651 m)   Wt 174 lb 0.6 oz (78.9 kg)   SpO2 97% Comment: room air  BMI 28.96 kg/m   Patient alert and oriented and in no cardiopulmonary distress.  HEENT: No facial asymmetry, EOMI,   oropharynx pink and moist.  Neck supple no JVD, no mass.  Chest: Clear to auscultation bilaterally.  CVS: S1, S2 no murmurs, no S3.Regular rate.  ABD: Soft no renal angle or suprapubic tenderness Ext: No edema  MS: Adequate ROM spine, shoulders, hips and knees.  Skin: Intact, no ulcerations or rash noted.  Psych: Good eye contact, normal affect. Memory intact not anxious or depressed appearing.  CNS: CN 2-12 intact, power,   normal throughout.no focal deficits noted.   Assessment & Plan  Hematuria 1 week h/o intermittent hematuria, denies significant dysuria and frequency, CCUA positive for  Blood, will send  For c/s , cipro x 3 days likely kidney stone, abdominal x ray ordered  Overweight (BMI 25.0-29.9) Deteriorated.  Patient re-educated about  the importance of commitment to a  minimum of 150 minutes of exercise per week as able.  The importance of healthy food choices with portion control discussed, as well as eating regularly and within a 12 hour window most days. The need to choose "clean , green" food 50 to 75% of the time is discussed, as well as to make water the primary drink and set a goal of 64 ounces water daily.  Encouraged to start a food diary,  and to consider  joining a support group. Sample diet sheets offered. Goals set by the patient for the next several months.   Weight /BMI 03/20/2018 12/14/2017 12/07/2017  WEIGHT 174 lb 0.6 oz 170 lb 166 lb 1.3 oz  HEIGHT 5\' 5"  5\' 5"  5\' 5"   BMI 28.96 kg/m2 28.29 kg/m2 27.64 kg/m2      Essential hypertension Controlled, no change in medication DASH diet and commitment to daily physical activity for a minimum of 30 minutes discussed and encouraged, as a part of hypertension management. The importance of attaining a healthy weight is also discussed.  BP/Weight 03/20/2018 12/14/2017 12/07/2017 12/04/2017 11/08/2017 09/20/2017 0/17/4944  Systolic BP 967 591 638 466 140  830 940  Diastolic BP 82 87 86 81 88 82 70  Wt. (Lbs) 174.04 170 166.08 170 170.2 169 160  BMI 28.96 28.29 27.64 28.29 28.32 28.12 26.63   Elevated at visit, no med change at this time    Hypothyroidism Updated lab needed at/ before next visit.

## 2018-03-20 NOTE — Assessment & Plan Note (Addendum)
1 week h/o intermittent hematuria, denies significant dysuria and frequency, CCUA positive for  Blood, will send  For c/s , cipro x 3 days likely kidney stone, abdominal x ray ordered

## 2018-03-21 ENCOUNTER — Encounter: Payer: Self-pay | Admitting: Family Medicine

## 2018-03-21 ENCOUNTER — Other Ambulatory Visit: Payer: Self-pay | Admitting: Family Medicine

## 2018-03-21 DIAGNOSIS — R310 Gross hematuria: Secondary | ICD-10-CM

## 2018-03-21 DIAGNOSIS — N201 Calculus of ureter: Secondary | ICD-10-CM

## 2018-03-21 NOTE — Assessment & Plan Note (Signed)
Controlled, no change in medication DASH diet and commitment to daily physical activity for a minimum of 30 minutes discussed and encouraged, as a part of hypertension management. The importance of attaining a healthy weight is also discussed.  BP/Weight 03/20/2018 12/14/2017 12/07/2017 12/04/2017 11/08/2017 09/20/2017 1/49/7026  Systolic BP 378 588 502 774 128 786 767  Diastolic BP 82 87 86 81 88 82 70  Wt. (Lbs) 174.04 170 166.08 170 170.2 169 160  BMI 28.96 28.29 27.64 28.29 28.32 28.12 26.63   Elevated at visit, no med change at this time

## 2018-03-21 NOTE — Assessment & Plan Note (Signed)
Deteriorated.  Patient re-educated about  the importance of commitment to a  minimum of 150 minutes of exercise per week as able.  The importance of healthy food choices with portion control discussed, as well as eating regularly and within a 12 hour window most days. The need to choose "clean , green" food 50 to 75% of the time is discussed, as well as to make water the primary drink and set a goal of 64 ounces water daily.  Encouraged to start a food diary,  and to consider  joining a support group. Sample diet sheets offered. Goals set by the patient for the next several months.   Weight /BMI 03/20/2018 12/14/2017 12/07/2017  WEIGHT 174 lb 0.6 oz 170 lb 166 lb 1.3 oz  HEIGHT 5\' 5"  5\' 5"  5\' 5"   BMI 28.96 kg/m2 28.29 kg/m2 27.64 kg/m2

## 2018-03-21 NOTE — Assessment & Plan Note (Signed)
Updated lab needed at/ before next visit.   

## 2018-03-22 ENCOUNTER — Other Ambulatory Visit: Payer: Self-pay | Admitting: Urology

## 2018-03-22 LAB — URINE CULTURE: Culture: <10000 — AB

## 2018-03-28 NOTE — H&P (Signed)
Office Visit Report     03/22/2018   --------------------------------------------------------------------------------   Gail Cox  MRN: [id number]  PRIMARY CARE:  Gail Nakayama, MD  DOB: 04-10-1965, 53 year old Female  REFERRING:  Gail Nakayama, MD  SSN:   PROVIDER:  Nicolette Bang, M.D.    LOCATION:  [facility] [phone]   --------------------------------------------------------------------------------   CC: I have kidney stones.  HPI: Gail Cox is a 53 year-old female patient who was referred by Dr. Tula Nakayama, MD who is here for renal calculi.  The problem is on the left side. She first stated noticing pain on approximately 03/15/2018. This is not her first kidney stone. Her first stone was approximately 03/03/2012. She has had 1 stones prior to getting this one. She is not currently having flank pain, back pain, groin pain, nausea, vomiting, fever or chills. She has not caught a stone in her urine strainer since her symptoms began.   She has never had surgical treatment for calculi in the past.   03/22/2018: 1 week ago she developed gross hematuria and had a KUB which shows a probably 66m left UPJ calculus. CT today confirmed a left 783mUPJ calculus. No currently flank pain. she is having intermittent gross hematuria. no dysuira     ALLERGIES: Oxycodone Tylox Vicodin    MEDICATIONS: Amlodipine Besilate  Biotin  Ciprofloxacin  Synthroid 100 mcg tablet     GU PSH: None   NON-GU PSH: Hysterectomy - 2003 Knee replacement - 03/22/2017 Neck Surgery - 2016 Thyroid Surgery - 2008    GU PMH: None   NON-GU PMH: Asthma Hypercholesterolemia Hypertension    FAMILY HISTORY: Colon Cancer - Aunt, Uncle Diabetes - Father   SOCIAL HISTORY: Marital Status: Single Preferred Language: English; Ethnicity: Not Hispanic Or Latino; Race: Black or African American Current Smoking Status: Patient has never smoked.   Tobacco Use Assessment  Completed: Used Tobacco in last 30 days? Does not use smokeless tobacco. Does not drink anymore.  Does not use drugs. Drinks 1 caffeinated drink per day. Patient's occupation isEducational psychologist   REVIEW OF SYSTEMS:    GU Review Female:   Patient reports frequent urination, get up at night to urinate, and trouble starting your stream. Patient denies hard to postpone urination, burning /pain with urination, leakage of urine, stream starts and stops, have to strain to urinate, and being pregnant.  Gastrointestinal (Upper):   Patient denies nausea, vomiting, and indigestion/ heartburn.  Gastrointestinal (Lower):   Patient denies diarrhea and constipation.  Constitutional:   Patient reports night sweats. Patient denies fatigue, fever, and weight loss.  Skin:   Patient denies skin rash/ lesion and itching.  Eyes:   Patient denies blurred vision and double vision.  Ears/ Nose/ Throat:   Patient denies sore throat and sinus problems.  Hematologic/Lymphatic:   Patient denies swollen glands and easy bruising.  Cardiovascular:   Patient denies leg swelling and chest pains.  Respiratory:   Patient denies cough and shortness of breath.  Endocrine:   Patient denies excessive thirst.  Musculoskeletal:   Patient denies back pain and joint pain.  Neurological:   Patient denies headaches and dizziness.  Psychologic:   Patient denies depression and anxiety.   Notes: blood in urine-sexually transmitted disease-weak stream    VITAL SIGNS:      03/22/2018 10:07 AM  Weight 170 lb / 77.11 kg  Height 65 in / 165.1 cm  BP 132/90 mmHg  Pulse 81 /min  Temperature  81.0 F / 27.2 C  BMI 28.3 kg/m   MULTI-SYSTEM PHYSICAL EXAMINATION:    Constitutional: Well-nourished. No physical deformities. Normally developed. Good grooming.  Neck: Neck symmetrical, not swollen. Normal tracheal position.  Respiratory: No labored breathing, no use of accessory muscles.   Cardiovascular: Normal temperature, normal extremity  pulses, no swelling, no varicosities.  Lymphatic: No enlargement of neck, axillae, groin.  Skin: No paleness, no jaundice, no cyanosis. No lesion, no ulcer, no rash.  Neurologic / Psychiatric: Oriented to time, oriented to place, oriented to person. No depression, no anxiety, no agitation.  Gastrointestinal: No mass, no tenderness, no rigidity, non obese abdomen.  Eyes: Normal conjunctivae. Normal eyelids.  Ears, Nose, Mouth, and Throat: Left ear no scars, no lesions, no masses. Right ear no scars, no lesions, no masses. Nose no scars, no lesions, no masses. Normal hearing. Normal lips.  Musculoskeletal: Normal gait and station of head and neck.     PAST DATA REVIEWED:  Source Of History:  Patient   PROCEDURES:         C.T. Urogram - [id number]      Patient confirmed No Neulasta OnPro Device.           Urinalysis w/Scope Dipstick Dipstick Cont'd Micro  Color: Amber Bilirubin: Neg mg/dL WBC/hpf: 0 - 5/hpf  Appearance: Cloudy Ketones: Neg mg/dL RBC/hpf: >60/hpf  Specific Gravity: 1.020 Blood: 3+ ery/uL Bacteria: Rare (0-9/hpf)  pH: 6.0 Protein: 1+ mg/dL Cystals: NS (Not Seen)  Glucose: Neg mg/dL Urobilinogen: 0.2 mg/dL Casts: NS (Not Seen)    Nitrites: Neg Trichomonas: Not Present    Leukocyte Esterase: Trace leu/uL Mucous: Present      Epithelial Cells: 0 - 5/hpf      Yeast: NS (Not Seen)      Sperm: Not Present    ASSESSMENT:      ICD-10 Details  1 GU:   Renal calculus - N20.0    PLAN:            Medications New Meds: Percocet 5 mg-325 mg tablet 1 tablet PO Q 4 H PRN   #30  0 Refill(s)  Tamsulosin Hcl 0.4 mg capsule 1 capsule PO Q HS   #30  0 Refill(s)            Orders X-Rays: [facility] Protocol Without Contrast          Schedule         Document Letter(s):  Created for Patient: Clinical Summary   Created for Gail Nakayama, MD         Notes:   Left UPJ calculus:  -We dsicussed MET versus URS versus ESWL and the patient elects fro ESWL. Rtc for percocet and  flomax given    * Signed by Nicolette Bang, M.D. on 03/22/18 at 11:12 AM (EST)*

## 2018-03-29 ENCOUNTER — Encounter (HOSPITAL_COMMUNITY): Payer: Self-pay | Admitting: General Practice

## 2018-03-29 ENCOUNTER — Ambulatory Visit (HOSPITAL_COMMUNITY): Payer: Managed Care, Other (non HMO)

## 2018-03-29 ENCOUNTER — Ambulatory Visit (HOSPITAL_COMMUNITY)
Admission: RE | Admit: 2018-03-29 | Discharge: 2018-03-29 | Disposition: A | Discharge status: Home / Self Care | Payer: Managed Care, Other (non HMO) | Source: Other Acute Inpatient Hospital | Attending: Urology | Admitting: Urology

## 2018-03-29 ENCOUNTER — Encounter (HOSPITAL_COMMUNITY)
Admission: RE | Disposition: A | Discharge status: Home / Self Care | Payer: Self-pay | Source: Other Acute Inpatient Hospital | Attending: Urology

## 2018-03-29 DIAGNOSIS — E890 Postprocedural hypothyroidism: Secondary | ICD-10-CM | POA: Insufficient documentation

## 2018-03-29 DIAGNOSIS — Z79899 Other long term (current) drug therapy: Secondary | ICD-10-CM | POA: Diagnosis not present

## 2018-03-29 DIAGNOSIS — N202 Calculus of kidney with calculus of ureter: Secondary | ICD-10-CM | POA: Diagnosis present

## 2018-03-29 DIAGNOSIS — Z79890 Hormone replacement therapy (postmenopausal): Secondary | ICD-10-CM | POA: Diagnosis not present

## 2018-03-29 DIAGNOSIS — Z96653 Presence of artificial knee joint, bilateral: Secondary | ICD-10-CM | POA: Diagnosis not present

## 2018-03-29 DIAGNOSIS — Z885 Allergy status to narcotic agent status: Secondary | ICD-10-CM | POA: Insufficient documentation

## 2018-03-29 DIAGNOSIS — N201 Calculus of ureter: Secondary | ICD-10-CM

## 2018-03-29 DIAGNOSIS — I10 Essential (primary) hypertension: Secondary | ICD-10-CM | POA: Insufficient documentation

## 2018-03-29 HISTORY — PX: EXTRACORPOREAL SHOCK WAVE LITHOTRIPSY: SHX1557

## 2018-03-29 HISTORY — DX: Personal history of urinary calculi: Z87.442

## 2018-03-29 SURGERY — LITHOTRIPSY, ESWL
Anesthesia: LOCAL | Laterality: Left

## 2018-03-29 MED ORDER — DIAZEPAM 5 MG PO TABS
10.0000 mg | ORAL_TABLET | ORAL | Status: AC
  Administered 2018-03-29: 10 mg via ORAL
  Filled 2018-03-29: qty 2

## 2018-03-29 MED ORDER — DIPHENHYDRAMINE HCL 25 MG PO CAPS
25.0000 mg | ORAL_CAPSULE | ORAL | Status: AC
  Administered 2018-03-29: 25 mg via ORAL
  Filled 2018-03-29: qty 1

## 2018-03-29 MED ORDER — CIPROFLOXACIN HCL 500 MG PO TABS
500.0000 mg | ORAL_TABLET | ORAL | Status: AC
  Administered 2018-03-29: 500 mg via ORAL
  Filled 2018-03-29: qty 1

## 2018-03-29 MED ORDER — SODIUM CHLORIDE 0.9 % IV SOLN
INTRAVENOUS | Status: DC
  Administered 2018-03-29: 07:00:00 via INTRAVENOUS

## 2018-03-29 NOTE — Op Note (Signed)
See [facility] operative note scanned into chart. Also because of the size, density, location and other factors that cannot be anticipated I feel this will likely be a staged procedure. This fact supersedes any indication in the scanned [facility] operative note to the contrary.  

## 2018-03-29 NOTE — Interval H&P Note (Signed)
History and Physical Interval Note:  03/29/2018 8:12 AM  Gail Cox  has presented today for surgery, with the diagnosis of LEFT URETERAL CALCULUS  The various methods of treatment have been discussed with the patient and family. After consideration of risks, benefits and other options for treatment, the patient has consented to  Procedure(s): EXTRACORPOREAL SHOCK WAVE LITHOTRIPSY (ESWL) (Left) as a surgical intervention .  The patient's history has been reviewed, patient examined, no change in status, stable for surgery.  I have reviewed the patient's chart and labs.  Questions were answered to the patient's satisfaction.     [employer]

## 2018-03-29 NOTE — Discharge Instructions (Signed)
1. You should strain your urine and collect all fragments and bring them to your follow up appointment.  °2. You should take your pain medication as needed.  Please call if your pain is severe to the point that it is not controlled with your pain medication. °3. You should call if you develop fever > 101 or persistent nausea or vomiting. °4. Your doctor may prescribe tamsulosin to take to help facilitate stone passage. °

## 2018-03-30 ENCOUNTER — Encounter (HOSPITAL_COMMUNITY): Payer: Self-pay | Admitting: Urology

## 2018-04-19 ENCOUNTER — Encounter (HOSPITAL_COMMUNITY): Payer: Self-pay

## 2018-04-19 NOTE — Therapy (Signed)
[place], [place], [address] Phone: [phone]   Fax:  [phone]  Patient Details  Name: Gail Cox MRN: [id number] Date of Birth: 1965/03/05 Referring Provider:  No ref. provider found  Encounter Date: 04/19/2018   PHYSICAL THERAPY DISCHARGE SUMMARY  Visits from Start of Care: 13  Current functional level related to goals / functional outcomes: Patient is being discharged from therapy episode as she has not returned since her last visit on 02/28/18. She called on 03/02/18 requesting to be placed on hold because she was returning to work and that she would call back to reschedule or let us know if she wanted to be discharged. She has met all goals with exception of her ROM of the Rt knee and had AROM from 4-118 at last visit. Since not returning she will be discharged and will require a new referral to return to therapy.    Remaining deficits: See last treatment note  And below goal status from last visit.       PT Short Term Goals - 02/26/18 1022            PT SHORT TERM GOAL #1   Title  Pt will demo understanding of HEP, updatd PRN, to imrpove Rt knee mobility and LE strength to imrpove functional mobility to return to PLOF and work.    Time  2    Period  Weeks    Status  Achieved        PT SHORT TERM GOAL #2   Title  Patient will improve ROM to 3-100 degrees to demonstrate significant improvement and to imrpove functional gait and moblity to be able to ascend/descend stairs.    Baseline  3-104    Time  4    Period  Weeks    Status  Achieved        PT SHORT TERM GOAL #3   Title  Patient will have reduce edema of Rt knee with circumferential measure no greater than 0.5 cm larger than Lt knee at joint line to improve ROM for knee joint and muscle activation.    Time  4    Period  Weeks    Status  Achieved       PT Long Term Goals - 02/26/18 1022            PT LONG TERM  GOAL #1   Title  Patient will achieve 0-120 degrees of knee ROM or equal to Lt knee to imrpove functional symmetry of gait and to be able to ascend/descend stairs with reciprocol step pattern.     Time  8    Period  Weeks    Status  On-going        PT LONG TERM GOAL #2   Title  Patient will have 4/5 or greater for all limited muscle groups on bil LE's to demonstrate improved functional LE strength.    Time  8    Period  Weeks    Status  Achieved        PT LONG TERM GOAL #3   Title  Patient will ambulate at 1.0 m/s with no assistive device to demonstrate safe community ambulation speed and improve balance and WB tolerance ot Rt knee to be able to return to work with decreased fall risk.    Time  8    Period  Weeks    Status  Achieved        PT LONG  TERM GOAL #4   Title  Patient will improve FOTO to 39% or less limitation to indicate significant improvement in function with mobility and daily activities and reduced self reported limitation related to Rt knee pain.     Time  8    Period  Weeks    Status  Achieved       Education / Equipment: Educated on [employer] periodically through therapy bout.   Plan: Patient agrees to discharge.  Patient goals were met. Patient is being discharged due to not returning since the last visit.  ?????      Kipp Brood, PT, DPT, [facility] Physical Therapist with [facility]  04/19/2018 1:30 PM    [place] [address] [place], [place], [address] Phone: [phone]   Fax:  [phone]

## 2018-05-29 LAB — COMPLETE METABOLIC PANEL WITH GFR
AG Ratio: 2.2 (calc) (ref 1.0–2.5)
ALBUMIN MSPROF: 4.7 g/dL (ref 3.6–5.1)
ALKALINE PHOSPHATASE (APISO): 60 U/L (ref 37–153)
ALT: 9 U/L (ref 6–29)
AST: 13 U/L (ref 10–35)
BILIRUBIN TOTAL: 0.6 mg/dL (ref 0.2–1.2)
BUN: 11 mg/dL (ref 7–25)
CHLORIDE: 107 mmol/L (ref 98–110)
CO2: 27 mmol/L (ref 20–32)
Calcium: 9.9 mg/dL (ref 8.6–10.4)
Creat: 0.77 mg/dL (ref 0.50–1.05)
GFR, Est African American: 101 mL/min/{1.73_m2} (ref >=60)
GFR, Est Non African American: 88 mL/min/{1.73_m2} (ref >=60)
GLUCOSE: 99 mg/dL (ref 65–99)
Globulin: 2.1 g/dL (calc) (ref 1.9–3.7)
Potassium: 3.9 mmol/L (ref 3.5–5.3)
Sodium: 139 mmol/L (ref 135–146)
Total Protein: 6.8 g/dL (ref 6.1–8.1)

## 2018-05-29 LAB — LIPID PANEL
CHOL/HDL RATIO: 3.2 (calc) (ref <5.0)
Cholesterol: 205 mg/dL — ABNORMAL HIGH (ref <200)
HDL: 64 mg/dL (ref >=50)
LDL CHOLESTEROL (CALC): 123 mg/dL — AB
NON-HDL CHOLESTEROL (CALC): 141 mg/dL — AB (ref <130)
TRIGLYCERIDES: 85 mg/dL (ref <150)

## 2018-05-29 LAB — HEMOGLOBIN A1C
EAG (MMOL/L): 6.3 (calc)
Hgb A1c MFr Bld: 5.6 % of total Hgb (ref <5.7)
Mean Plasma Glucose: 114 (calc)

## 2018-05-29 LAB — TSH: TSH: 0.24 m[IU]/L — AB

## 2018-05-31 ENCOUNTER — Other Ambulatory Visit: Payer: Self-pay

## 2018-05-31 ENCOUNTER — Ambulatory Visit (INDEPENDENT_AMBULATORY_CARE_PROVIDER_SITE_OTHER): Payer: Managed Care, Other (non HMO) | Admitting: Family Medicine

## 2018-05-31 ENCOUNTER — Encounter: Payer: Self-pay | Admitting: Family Medicine

## 2018-05-31 VITALS — BP 123/82 | Ht 65.0 in | Wt 174.0 lb

## 2018-05-31 DIAGNOSIS — E038 Other specified hypothyroidism: Secondary | ICD-10-CM | POA: Diagnosis not present

## 2018-05-31 DIAGNOSIS — I10 Essential (primary) hypertension: Secondary | ICD-10-CM | POA: Diagnosis not present

## 2018-05-31 DIAGNOSIS — J4520 Mild intermittent asthma, uncomplicated: Secondary | ICD-10-CM | POA: Diagnosis not present

## 2018-05-31 DIAGNOSIS — E785 Hyperlipidemia, unspecified: Secondary | ICD-10-CM | POA: Diagnosis not present

## 2018-05-31 DIAGNOSIS — R7302 Impaired glucose tolerance (oral): Secondary | ICD-10-CM

## 2018-05-31 MED ORDER — LEVOTHYROXINE SODIUM 100 MCG PO TABS: ORAL_TABLET | ORAL | 3 refills | Status: DC

## 2018-05-31 NOTE — Progress Notes (Signed)
Virtual Visit via Telephone Note  I connected with Gail Cox on 05/31/18 at 10:20 AM EDT by telephone and verified that I am speaking with the correct person using two identifiers.  Location: Patient:parked  in vehicle getting food Provider: office    I discussed the limitations, risks, security and privacy concerns of performing an evaluation and management service by telephone and the availability of in person appointments. I also discussed with the patient that there may be a patient responsible charge related to this service. The patient expressed understanding and agreed to proceed.   History of Present Illness: F/u chronic problems and lab review Recovered well from lithotripsy[sy, most recent Xray still sees possible ureteral sludge so she has another trip to Urology , denies any current hematuria or  Pain Denies recent fever or chills. Denies sinus pressure, nasal congestion, ear pain or sore throat. Denies chest congestion, productive cough or wheezing. Denies chest pains, palpitations and leg swelling Denies abdominal pain, nausea, vomiting,diarrhea or constipation.   Denies dysuria, frequency, hesitancy or incontinence. Denies joint pain, swelling and limitation in mobility. Denies headaches, seizures, numbness, or tingling. 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       Observations/Objective: BP 123/82   Ht 5\' 5"  (1.651 m)   Wt 174 lb (78.9 kg)   BMI 28.96 kg/m  Good communication with no confusion and intact memory. Alert and oriented x 3 No signs of respiratory distress during sppech    Assessment and Plan: Hypothyroidism Over corrected, dose reduction with rept lab in 6 weeks  Hyperlipidemia LDL goal <100 Hyperlipidemia:Low fat diet discussed and encouraged.   Lipid Panel  Lab Results  Component Value  Date   CHOL 205 (H) 05/28/2018   HDL 64 05/28/2018   LDLCALC 123 (H) 05/28/2018   TRIG 85 05/28/2018   CHOLHDL 3.2 05/28/2018   Needs to reduce fatty food   Essential hypertension Controlled, no change in medication DASH diet and commitment to daily physical activity for a minimum of 30 minutes discussed and encouraged, as a part of hypertension management. The importance of attaining a healthy weight is also discussed.  BP/Weight 05/31/2018 03/29/2018 03/20/2018 12/14/2017 12/07/2017 12/04/2017 76/02/9507  Systolic BP 326 712 458 099 833 825 053  Diastolic BP 82 80 82 87 86 81 88  Wt. (Lbs) 174 174.04 174.04 170 166.08 170 170.2  BMI 28.96 28.96 28.96 28.29 27.64 28.29 28.32       Asthma in adult Controlled with infrequent flares, rescue inhaler only  IGT (impaired glucose tolerance) Patient educated about the importance of limiting  Carbohydrate intake , the need to commit to daily physical activity for a minimum of 30 minutes , and to commit weight loss. The fact that changes in all these areas will reduce or eliminate all together the development of diabetes is stressed.  Within normal a but has increased, dligence with die is stressed  Diabetic Labs Latest Ref Rng & Units 05/28/2018 09/15/2017 01/05/2017 09/19/2016 02/26/2016  HbA1c <5.7 % of total Hgb 5.6 5.4 - 5.3 -  Chol <200 mg/dL 205(H) 200(H) - 196 -  HDL > OR = 50 mg/dL 64 64 - 73 -  Calc LDL mg/dL (calc) 123(H) 121(H) - 114(H) -  Triglycerides <150 mg/dL 85 61 - 44 -  Creatinine 0.50 - 1.05 mg/dL 0.77 0.77 0.82 0.70 0.71  BP/Weight 05/31/2018 03/29/2018 03/20/2018 12/14/2017 12/07/2017 12/04/2017 30/02/6008  Systolic BP 932 355 732 202 542 706 237  Diastolic BP 82 80 82 87 86 81 88  Wt. (Lbs) 174 174.04 174.04 170 166.08 170 170.2  BMI 28.96 28.96 28.96 28.29 27.64 28.29 28.32   No flowsheet data found.      Follow Up Instructions:    I discussed the assessment and treatment plan with the patient. The patient was  provided an opportunity to ask questions and all were answered. The patient agreed with the plan and demonstrated an understanding of the instructions.   The patient was advised to call back or seek an in-person evaluation if the symptoms worsen or if the condition fails to improve as anticipated.  I provided 21 minutes of non-face-to-face time during this encounter.   Tula Nakayama, MD

## 2018-05-31 NOTE — Patient Instructions (Signed)
F/u in office in 6 months, call if you need me before  Please get non fasting TSH and CBC week of August 17  Please REDUCE synthroid dose. Take HALF tablet every Monday, Wednesday, Friday and Sunday, and one whole tablet every Tuesday, Thursday and Saturday  Social distancing. Frequent hand washing with soap and water Keeping your hands off of your face.Wear face mask when outside of your home, gloves on your job, and maintain a 6 ft distance   These 3 practices will help to keep both you and your community healthy during this time. Please practice them faithfully!  Please reduce fried and fatty foods, also sugary food and starches, increase vegetables, your cholesterol is higher than  It should be for good health, and your blood sugar has increased slightly ,but is still normal  Thankful that kidney stone problem is much better, you do need to keep your 1 month f/u with Urology as we discussed  It is important that you exercise regularly at least 30 minutes 5 times a week. If you develop chest pain, have severe difficulty breathing, or feel very tired, stop exercising immediately and seek medical attention  Thanks for choosing [facility], we consider it a privelige to serve you.

## 2018-06-01 ENCOUNTER — Other Ambulatory Visit: Payer: Self-pay | Admitting: Family Medicine

## 2018-06-01 DIAGNOSIS — Z1231 Encounter for screening mammogram for malignant neoplasm of breast: Secondary | ICD-10-CM

## 2018-06-03 ENCOUNTER — Encounter: Payer: Self-pay | Admitting: Family Medicine

## 2018-06-03 NOTE — Assessment & Plan Note (Signed)
Patient educated about the importance of limiting  Carbohydrate intake , the need to commit to daily physical activity for a minimum of 30 minutes , and to commit weight loss. The fact that changes in all these areas will reduce or eliminate all together the development of diabetes is stressed.  Within normal a but has increased, dligence with die is stressed  Diabetic Labs Latest Ref Rng & Units 05/28/2018 09/15/2017 01/05/2017 09/19/2016 02/26/2016  HbA1c <5.7 % of total Hgb 5.6 5.4 - 5.3 -  Chol <200 mg/dL 205(H) 200(H) - 196 -  HDL > OR = 50 mg/dL 64 64 - 73 -  Calc LDL mg/dL (calc) 123(H) 121(H) - 114(H) -  Triglycerides <150 mg/dL 85 61 - 44 -  Creatinine 0.50 - 1.05 mg/dL 0.77 0.77 0.82 0.70 0.71   BP/Weight 05/31/2018 03/29/2018 03/20/2018 12/14/2017 12/07/2017 12/04/2017 88/03/8001  Systolic BP 491 791 505 697 948 016 553  Diastolic BP 82 80 82 87 86 81 88  Wt. (Lbs) 174 174.04 174.04 170 166.08 170 170.2  BMI 28.96 28.96 28.96 28.29 27.64 28.29 28.32   No flowsheet data found.

## 2018-06-03 NOTE — Assessment & Plan Note (Signed)
Hyperlipidemia:Low fat diet discussed and encouraged.   Lipid Panel  Lab Results  Component Value Date   CHOL 205 (H) 05/28/2018   HDL 64 05/28/2018   LDLCALC 123 (H) 05/28/2018   TRIG 85 05/28/2018   CHOLHDL 3.2 05/28/2018   Needs to reduce fatty food

## 2018-06-03 NOTE — Assessment & Plan Note (Signed)
Controlled with infrequent flares, rescue inhaler only

## 2018-06-03 NOTE — Assessment & Plan Note (Signed)
Over corrected, dose reduction with rept lab in 6 weeks

## 2018-06-03 NOTE — Assessment & Plan Note (Signed)
Controlled, no change in medication DASH diet and commitment to daily physical activity for a minimum of 30 minutes discussed and encouraged, as a part of hypertension management. The importance of attaining a healthy weight is also discussed.  BP/Weight 05/31/2018 03/29/2018 03/20/2018 12/14/2017 12/07/2017 12/04/2017 44/07/2861  Systolic BP 817 711 657 903 833 383 291  Diastolic BP 82 80 82 87 86 81 88  Wt. (Lbs) 174 174.04 174.04 170 166.08 170 170.2  BMI 28.96 28.96 28.96 28.29 27.64 28.29 28.32

## 2018-07-30 ENCOUNTER — Other Ambulatory Visit: Payer: Self-pay | Admitting: Internal Medicine

## 2018-07-30 ENCOUNTER — Other Ambulatory Visit: Payer: Self-pay

## 2018-07-30 DIAGNOSIS — Z20822 Contact with and (suspected) exposure to covid-19: Secondary | ICD-10-CM

## 2018-08-04 LAB — NOVEL CORONAVIRUS, NAA: SARS-CoV-2, NAA: NOT DETECTED

## 2018-09-11 ENCOUNTER — Ambulatory Visit: Payer: Managed Care, Other (non HMO) | Admitting: Family Medicine

## 2018-09-19 ENCOUNTER — Encounter (INDEPENDENT_AMBULATORY_CARE_PROVIDER_SITE_OTHER): Payer: Self-pay

## 2018-09-19 ENCOUNTER — Ambulatory Visit (INDEPENDENT_AMBULATORY_CARE_PROVIDER_SITE_OTHER): Payer: Managed Care, Other (non HMO)

## 2018-09-19 ENCOUNTER — Other Ambulatory Visit: Payer: Self-pay

## 2018-09-19 DIAGNOSIS — Z23 Encounter for immunization: Secondary | ICD-10-CM

## 2018-09-19 NOTE — Progress Notes (Signed)
Patient came in for flu vaccine. Given in left deltoid.

## 2018-09-21 ENCOUNTER — Other Ambulatory Visit: Payer: Self-pay

## 2018-09-21 ENCOUNTER — Ambulatory Visit
Admission: RE | Admit: 2018-09-21 | Discharge: 2018-09-21 | Disposition: A | Discharge status: Home / Self Care | Payer: Managed Care, Other (non HMO) | Source: Ambulatory Visit | Attending: Family Medicine | Admitting: Family Medicine

## 2018-09-21 DIAGNOSIS — Z1231 Encounter for screening mammogram for malignant neoplasm of breast: Secondary | ICD-10-CM

## 2018-10-30 ENCOUNTER — Other Ambulatory Visit: Payer: Self-pay | Admitting: Family Medicine

## 2018-12-03 ENCOUNTER — Ambulatory Visit: Payer: Managed Care, Other (non HMO) | Admitting: Family Medicine

## 2018-12-08 ENCOUNTER — Other Ambulatory Visit: Payer: Self-pay | Admitting: Family Medicine

## 2018-12-28 ENCOUNTER — Other Ambulatory Visit: Payer: Self-pay | Admitting: Family Medicine

## 2018-12-28 DIAGNOSIS — I10 Essential (primary) hypertension: Secondary | ICD-10-CM

## 2018-12-31 ENCOUNTER — Other Ambulatory Visit: Payer: Self-pay

## 2018-12-31 DIAGNOSIS — I10 Essential (primary) hypertension: Secondary | ICD-10-CM

## 2018-12-31 MED ORDER — AMLODIPINE BESYLATE 5 MG PO TABS: 5.0000 mg | ORAL_TABLET | Freq: Every day | ORAL | 0 refills | Status: DC

## 2019-02-21 ENCOUNTER — Ambulatory Visit (INDEPENDENT_AMBULATORY_CARE_PROVIDER_SITE_OTHER): Payer: Managed Care, Other (non HMO) | Admitting: Family Medicine

## 2019-02-21 ENCOUNTER — Other Ambulatory Visit: Payer: Self-pay

## 2019-02-21 ENCOUNTER — Encounter: Payer: Self-pay | Admitting: Family Medicine

## 2019-02-21 VITALS — BP 124/70 | Ht 65.0 in | Wt 170.0 lb

## 2019-02-21 DIAGNOSIS — J4520 Mild intermittent asthma, uncomplicated: Secondary | ICD-10-CM | POA: Diagnosis not present

## 2019-02-21 DIAGNOSIS — I10 Essential (primary) hypertension: Secondary | ICD-10-CM | POA: Diagnosis not present

## 2019-02-21 DIAGNOSIS — R0981 Nasal congestion: Secondary | ICD-10-CM | POA: Diagnosis not present

## 2019-02-21 DIAGNOSIS — U071 COVID-19: Secondary | ICD-10-CM

## 2019-02-21 NOTE — Progress Notes (Signed)
Virtual Visit via Telephone Note  I connected with Gail Cox on 02/21/19 at  2:00 PM EST by telephone and verified that I am speaking with the correct person using two identifiers.  Location: Patient: work Provider: office    I discussed the limitations, risks, security and privacy concerns of performing an evaluation and management service by telephone and the availability of in person appointments. I also discussed with the patient that there may be a patient responsible charge related to this service. The patient expressed understanding and agreed to proceed.   History of Present Illness:   4 day h/o stuffy head no pressure, cough at times, no fever or chills, clear  Nasal drainage , had ear and throat pain, no known covid exposure Denies shortness of breath Observations/Objective: BP 124/70   Ht 5\' 5"  (1.651 m)   Wt 170 lb (77.1 kg)   BMI 28.29 kg/m  Good communication with no confusion and intact memory. Alert and oriented x 3 No signs of respiratory distress during speech    Assessment and Plan: Sinus congestion Currently uncontrolled use sudafed one daily for next 3 to 5 days and commit to daily allergy medication  Asthma in adult No current flare  Essential hypertension Controlled, no change in medication DASH diet and commitment to daily physical activity for a minimum of 30 minutes discussed and encouraged, as a part of hypertension management. The importance of attaining a healthy weight is also discussed.  BP/Weight 02/21/2019 05/31/2018 03/29/2018 03/20/2018 12/14/2017 12/07/2017 0000000  Systolic BP A999333 [id number] 123456 Q000111Q Q000111Q [id number]  Diastolic BP 70 82 80 82 87 86 81  Wt. (Lbs) 170 174 174.04 174.04 170 166.08 170  BMI 28.29 28.96 28.96 28.96 28.29 27.64 28.29       Lab test positive for detection of COVID-19 virus Call on 02/24/2019 pt is covid positive Will have work excuse x 2 weeks, needs tele f/u visit 5 day course of prednisone and albuterol  prescribed Daily temp checks to start. She is to have infusion this week Wednesday    Follow Up Instructions:    I discussed the assessment and treatment plan with the patient. The patient was provided an opportunity to ask questions and all were answered. The patient agreed with the plan and demonstrated an understanding of the instructions.   The patient was advised to call back or seek an in-person evaluation if the symptoms worsen or if the condition fails to improve as anticipated.  I provided 12 minutes of non-face-to-face time during this encounter.   Tula Nakayama, MD

## 2019-02-21 NOTE — Patient Instructions (Signed)
F/u in office with MD in 6 months, call if you need me sooner  Use tylenol pm one at bedtime for head congestion,as long as you need it.  You may take OTC sudafed 1 daly if needed, for excess drainage for no more than 3 days in a row  Thanks for choosing  Primary Care, we consider it a privelige to serve you.     Thanks for choosing [facility], we consider it a privelige to serve you.

## 2019-02-22 ENCOUNTER — Ambulatory Visit: Payer: Managed Care, Other (non HMO) | Attending: Internal Medicine

## 2019-02-22 ENCOUNTER — Other Ambulatory Visit: Payer: Self-pay

## 2019-02-22 DIAGNOSIS — Z20822 Contact with and (suspected) exposure to covid-19: Secondary | ICD-10-CM

## 2019-02-23 LAB — NOVEL CORONAVIRUS, NAA: SARS-CoV-2, NAA: DETECTED — AB

## 2019-02-24 ENCOUNTER — Other Ambulatory Visit: Payer: Self-pay | Admitting: Adult Health

## 2019-02-24 ENCOUNTER — Telehealth: Payer: Self-pay | Admitting: Family Medicine

## 2019-02-24 ENCOUNTER — Encounter: Payer: Self-pay | Admitting: Family Medicine

## 2019-02-24 DIAGNOSIS — U071 COVID-19: Secondary | ICD-10-CM | POA: Insufficient documentation

## 2019-02-24 DIAGNOSIS — R0981 Nasal congestion: Secondary | ICD-10-CM | POA: Insufficient documentation

## 2019-02-24 MED ORDER — PREDNISONE 5 MG PO TABS: 5.0000 mg | ORAL_TABLET | Freq: Two times a day (BID) | ORAL | 0 refills | Status: AC

## 2019-02-24 MED ORDER — ALBUTEROL SULFATE HFA 108 (90 BASE) MCG/ACT IN AERS: 2.0000 | INHALATION_SPRAY | Freq: Four times a day (QID) | RESPIRATORY_TRACT | 0 refills | Status: AC | PRN

## 2019-02-24 NOTE — Assessment & Plan Note (Signed)
No current flare 

## 2019-02-24 NOTE — Assessment & Plan Note (Signed)
Call on 02/24/2019 pt is covid positive Will have work excuse x 2 weeks, needs tele f/u visit 5 day course of prednisone and albuterol prescribed Daily temp checks to start. She is to have infusion this week Wednesday

## 2019-02-24 NOTE — Telephone Encounter (Signed)
Pt called in to report covid positive lab result, and is scheduled to get iV infusion in next 3 days as she is hypertensive, she has agreed to do this. She denies shortness of breath and has not had known fever. Requests albuterol and prednisone due to her asthma, short course prednisone oi is prescribed. Self care at home and monitoring including daily temp checks discussed Needs phone f/u and will need 2 week work excuse  Pls call pt and schedule a phone f/u Covid 19 infection first week in  Feb , the Wednesday / Thursday

## 2019-02-24 NOTE — Assessment & Plan Note (Signed)
Currently uncontrolled use sudafed one daily for next 3 to 5 days and commit to daily allergy medication

## 2019-02-24 NOTE — Assessment & Plan Note (Signed)
Controlled, no change in medication DASH diet and commitment to daily physical activity for a minimum of 30 minutes discussed and encouraged, as a part of hypertension management. The importance of attaining a healthy weight is also discussed.  BP/Weight 02/21/2019 05/31/2018 03/29/2018 03/20/2018 12/14/2017 12/07/2017 0000000  Systolic BP A999333 [id number] 123456 Q000111Q Q000111Q [id number]  Diastolic BP 70 82 80 82 87 86 81  Wt. (Lbs) 170 174 174.04 174.04 170 166.08 170  BMI 28.29 28.96 28.96 28.96 28.29 27.64 28.29

## 2019-02-24 NOTE — Progress Notes (Signed)
  I connected by phone with [name] on 02/24/2019 at 9:52 AM to discuss the potential use of an new treatment for mild to moderate COVID-19 viral infection in non-hospitalized patients.  This patient is a 54 y.o. female that meets the FDA criteria for Emergency Use Authorization of bamlanivimab or casirivimab\imdevimab.  Has a (+) direct SARS-CoV-2 viral test result  Has mild or moderate COVID-19   Is ? 54 years of age and weighs ? 40 kg  Is NOT hospitalized due to COVID-19  Is NOT requiring oxygen therapy or requiring an increase in baseline oxygen flow rate due to COVID-19  Is within 10 days of symptom onset  Has at least one of the high risk factor(s) for progression to severe COVID-19 and/or hospitalization as defined in EUA.  Specific high risk criteria : Hypertension   I have spoken and communicated the following to the patient or parent/caregiver:  1. FDA has authorized the emergency use of bamlanivimab and casirivimab\imdevimab for the treatment of mild to moderate COVID-19 in adults and pediatric patients with positive results of direct SARS-CoV-2 viral testing who are 68 years of age and older weighing at least 40 kg, and who are at high risk for progressing to severe COVID-19 and/or hospitalization.  2. The significant known and potential risks and benefits of bamlanivimab and casirivimab\imdevimab, and the extent to which such potential risks and benefits are unknown.  3. Information on available alternative treatments and the risks and benefits of those alternatives, including clinical trials.  4. Patients treated with bamlanivimab and casirivimab\imdevimab should continue to self-isolate and use infection control measures (e.g., wear mask, isolate, social distance, avoid sharing personal items, clean and disinfect "high touch" surfaces, and frequent handwashing) according to CDC guidelines.   5. The patient or parent/caregiver has the option to accept or refuse  bamlanivimab or casirivimab\imdevimab .  After reviewing this information with the patient, The patient agreed to proceed with receiving the bamlanimivab infusion and will be provided a copy of the Fact sheet prior to receiving the infusion.Lynelle Smoke Omara Alcon 02/24/2019 9:52 AM

## 2019-02-26 NOTE — Telephone Encounter (Signed)
Pt has appt 03-06-19 at 9:40

## 2019-02-27 ENCOUNTER — Ambulatory Visit (HOSPITAL_COMMUNITY)
Admission: RE | Admit: 2019-02-27 | Discharge: 2019-02-27 | Disposition: A | Discharge status: Home / Self Care | Payer: Managed Care, Other (non HMO) | Source: Ambulatory Visit | Attending: Pulmonary Disease | Admitting: Pulmonary Disease

## 2019-02-27 DIAGNOSIS — U071 COVID-19: Secondary | ICD-10-CM

## 2019-02-27 MED ORDER — SODIUM CHLORIDE 0.9 % IV SOLN
700.0000 mg | Freq: Once | INTRAVENOUS | Status: AC
  Administered 2019-02-27: 700 mg via INTRAVENOUS
  Filled 2019-02-27: qty 20

## 2019-02-27 MED ORDER — EPINEPHRINE 0.3 MG/0.3ML IJ SOAJ: 0.3000 mg | Freq: Once | INTRAMUSCULAR | Status: DC | PRN

## 2019-02-27 MED ORDER — ALBUTEROL SULFATE HFA 108 (90 BASE) MCG/ACT IN AERS: 2.0000 | INHALATION_SPRAY | Freq: Once | RESPIRATORY_TRACT | Status: DC | PRN

## 2019-02-27 MED ORDER — SODIUM CHLORIDE 0.9 % IV SOLN
INTRAVENOUS | Status: DC | PRN
  Administered 2019-02-27: 250 mL via INTRAVENOUS

## 2019-02-27 MED ORDER — FAMOTIDINE IN NACL 20-0.9 MG/50ML-% IV SOLN: 20.0000 mg | Freq: Once | INTRAVENOUS | Status: DC | PRN

## 2019-02-27 MED ORDER — DIPHENHYDRAMINE HCL 50 MG/ML IJ SOLN: 50.0000 mg | Freq: Once | INTRAMUSCULAR | Status: DC | PRN

## 2019-02-27 MED ORDER — METHYLPREDNISOLONE SODIUM SUCC 125 MG IJ SOLR: 125.0000 mg | Freq: Once | INTRAMUSCULAR | Status: DC | PRN

## 2019-02-27 NOTE — Progress Notes (Signed)
  Diagnosis: COVID-19  Physician: Dr. Tula Nakayama  Procedure: [facility] Med: bamlanivimab infusion - Provided patient with bamlanimivab fact sheet for patients, parents and caregivers prior to infusion.  Complications: No immediate complications noted.  Discharge: Discharged home   Gail Cox L 02/27/2019

## 2019-02-27 NOTE — Discharge Instructions (Signed)
What types of side effects do monoclonal antibody drugs cause?  °Common side effects ° °In general, the more common side effects caused by monoclonal antibody drugs include: °• Allergic reactions, such as hives or itching °• Flu-like signs and symptoms, including chills, fatigue, fever, and muscle aches and pains °• Nausea, vomiting °• Diarrhea °• Skin rashes °• Low blood pressure ° ° °The CDC is recommending patients who receive monoclonal antibody treatments wait at least 90 days before being vaccinated. ° °Currently, there are no data on the safety and efficacy of mRNA COVID-19 vaccines in persons who received monoclonal antibodies or convalescent plasma as part of COVID-19 treatment. Based on the estimated half-life of such therapies as well as evidence suggesting that reinfection is uncommon in the 90 days after initial infection, vaccination should be deferred for at least 90 days, as a precautionary measure until additional information becomes available, to avoid interference of the antibody treatment with vaccine-induced immune responses. ° ° °10 Things You Can Do to Manage Your COVID-19 Symptoms at Home °If you have possible or confirmed COVID-19: °1. Stay home from work and school. And stay away from other public places. If you must go out, avoid using any kind of public transportation, ridesharing, or taxis. °2. Monitor your symptoms carefully. If your symptoms get worse, call your healthcare provider immediately. °3. Get rest and stay hydrated. °4. If you have a medical appointment, call the healthcare provider ahead of time and tell them that you have or may have COVID-19. °5. For medical emergencies, call 911 and notify the dispatch personnel that you have or may have COVID-19. °6. Cover your cough and sneezes with a tissue or use the inside of your elbow. °7. Wash your hands often with soap and water for at least 20 seconds or clean your hands with an alcohol-based hand sanitizer that contains at  least 60% alcohol. °8. As much as possible, stay in a specific room and away from other people in your home. Also, you should use a separate bathroom, if available. If you need to be around other people in or outside of the home, wear a mask. °9. Avoid sharing personal items with other people in your household, like dishes, towels, and bedding. °10. Clean all surfaces that are touched often, like counters, tabletops, and doorknobs. Use household cleaning sprays or wipes according to the label instructions. °cdc.gov/coronavirus °08/01/2018 °This information is not intended to replace advice given to you by your health care provider. Make sure you discuss any questions you have with your health care provider. °Document Revised: 01/03/2019 Document Reviewed: 01/03/2019 °Elsevier Patient Education © 2020 [employer] °What types of side effects do monoclonal antibody drugs cause?  °Common side effects ° °In general, the more common side effects caused by monoclonal antibody drugs include: °• Allergic reactions, such as hives or itching °• Flu-like signs and symptoms, including chills, fatigue, fever, and muscle aches and pains °• Nausea, vomiting °• Diarrhea °• Skin rashes °• Low blood pressure ° ° °The CDC is recommending patients who receive monoclonal antibody treatments wait at least 90 days before being vaccinated. ° °Currently, there are no data on the safety and efficacy of mRNA COVID-19 vaccines in persons who received monoclonal antibodies or convalescent plasma as part of COVID-19 treatment. Based on the estimated half-life of such therapies as well as evidence suggesting that reinfection is uncommon in the 90 days after initial infection, vaccination should be deferred for at least 90 days, as a precautionary measure until   additional information becomes available, to avoid interference of the antibody treatment with vaccine-induced immune responses. °

## 2019-02-27 NOTE — Progress Notes (Signed)
  Diagnosis: COVID-19  Physician: Dr. Joya Gaskins  Procedure: [facility] Med: bamlanivimab infusion - Provided patient with bamlanimivab fact sheet for patients, parents and caregivers prior to infusion.  Complications: No immediate complications noted.  Discharge: Discharged home   Gail Cox 02/27/2019

## 2019-03-05 ENCOUNTER — Ambulatory Visit (INDEPENDENT_AMBULATORY_CARE_PROVIDER_SITE_OTHER): Payer: Managed Care, Other (non HMO) | Admitting: Family Medicine

## 2019-03-05 ENCOUNTER — Other Ambulatory Visit: Payer: Self-pay

## 2019-03-05 ENCOUNTER — Encounter: Payer: Self-pay | Admitting: Family Medicine

## 2019-03-05 VITALS — BP 135/82 | Temp 98.4°F | Ht 65.0 in | Wt 170.0 lb

## 2019-03-05 DIAGNOSIS — I10 Essential (primary) hypertension: Secondary | ICD-10-CM | POA: Diagnosis not present

## 2019-03-05 DIAGNOSIS — R7302 Impaired glucose tolerance (oral): Secondary | ICD-10-CM

## 2019-03-05 DIAGNOSIS — E785 Hyperlipidemia, unspecified: Secondary | ICD-10-CM | POA: Diagnosis not present

## 2019-03-05 DIAGNOSIS — E038 Other specified hypothyroidism: Secondary | ICD-10-CM | POA: Diagnosis not present

## 2019-03-05 DIAGNOSIS — J4520 Mild intermittent asthma, uncomplicated: Secondary | ICD-10-CM

## 2019-03-05 DIAGNOSIS — U071 COVID-19: Secondary | ICD-10-CM

## 2019-03-05 DIAGNOSIS — E559 Vitamin D deficiency, unspecified: Secondary | ICD-10-CM

## 2019-03-05 DIAGNOSIS — F321 Major depressive disorder, single episode, moderate: Secondary | ICD-10-CM

## 2019-03-05 MED ORDER — PANTOPRAZOLE SODIUM 20 MG PO TBEC: DELAYED_RELEASE_TABLET | ORAL | 0 refills | Status: DC

## 2019-03-05 NOTE — Progress Notes (Signed)
Virtual Visit via Telephone Note  I connected with [name] on 03/05/19 at  2:00 PM EST by telephone and verified that I am speaking with the correct person using two identifiers.  Location: Patient: home Provider: office   I discussed the limitations, risks, security and privacy concerns of performing an evaluation and management service by telephone and the availability of in person appointments. I also discussed with the patient that there may be a patient responsible charge related to this service. The patient expressed understanding and agreed to proceed.   History of Present Illness: F/U covid diagnosis 2 weeks ago with upcoming return to work. For the most part was asymptomatic, did have slight chest congestion and burning occasionally and also used albuterol MDI a few times. Denies ever having fever or chills, no loss of taste  or smell  Feeling of depression is increased with Covid restrictions esp now she is house confined, has no active suicidal or homicidal plan but wonders at times as to purpose of her living. No interest in medication or therapy, therapy in particular is of no benefit and may be detrimental Observations/Objective:  BP 135/82   Temp 98.4 F (36.9 C) (Temporal)   Ht 5\' 5"  (1.651 m)   Wt 170 lb (77.1 kg)   BMI 28.29 kg/m  Good communication with no confusion and intact memory. Alert and oriented x 3 No signs of respiratory distress during speech   Assessment and Plan: Essential hypertension Controlled, no change in medication DASH diet and commitment to daily physical activity for a minimum of 30 minutes discussed and encouraged, as a part of hypertension management. The importance of attaining a healthy weight is also discussed.  BP/Weight 03/05/2019 02/27/2019 02/21/2019 05/31/2018 03/29/2018 03/20/2018 99991111  Systolic BP A999333 A999333 A999333 [id number]  Diastolic BP 82 83 70 82 80 82 87  Wt. (Lbs) 170 - 170 174 174.04 174.04 170  BMI 28.29 - 28.29  28.96 28.96 28.96 28.29       Depression, major, single episode, moderate (HCC) Will f/u at next visit, long history , most of which is driven by family dynamics and personal h/o dysfunctional family , also loneliness  Hypothyroidism Updated lab needed at/ before next visit.   IGT (impaired glucose tolerance) Patient educated about the importance of limiting  Carbohydrate intake , the need to commit to daily physical activity for a minimum of 30 minutes , and to commit weight loss. The fact that changes in all these areas will reduce or eliminate all together the development of diabetes is stressed.  Has normalized in recent times which is great  Diabetic Labs Latest Ref Rng & Units 03/13/2019 05/28/2018 09/15/2017 01/05/2017 09/19/2016  HbA1c <5.7 % of total Hgb 5.6 5.6 5.4 - 5.3  Chol <200 mg/dL 219(H) 205(H) 200(H) - 196  HDL > OR = 50 mg/dL 82 64 64 - 73  Calc LDL mg/dL (calc) 122(H) 123(H) 121(H) - 114(H)  Triglycerides <150 mg/dL 55 85 61 - 44  Creatinine 0.50 - 1.05 mg/dL 0.79 0.77 0.77 0.82 0.70   BP/Weight 03/05/2019 02/27/2019 02/21/2019 05/31/2018 03/29/2018 03/20/2018 99991111  Systolic BP A999333 A999333 A999333 [id number] 123456 [id number]  Diastolic BP 82 83 70 82 80 82 87  Wt. (Lbs) 170 - 170 174 174.04 174.04 170  BMI 28.29 - 28.29 28.96 28.96 28.96 28.29   No flowsheet data found.    Asthma in adult Stable, recent use of rescue in  Keeping with infection  Lab  test positive for detection of COVID-19 virus Return to work with no restrictions. Afebrile, not set up for my charge messaging Discussed warning symptoms of acute deterioration requiring ED visit, she verbalizes understandiong    Follow Up Instructions:    I discussed the assessment and treatment plan with the patient. The patient was provided an opportunity to ask questions and all were answered. The patient agreed with the plan and demonstrated an understanding of the instructions.   The patient was advised to call back or  seek an in-person evaluation if the symptoms worsen or if the condition fails to improve as anticipated.  I provided 20 minutes of non-face-to-face time during this encounter.   Tula Nakayama, MD

## 2019-03-05 NOTE — Patient Instructions (Addendum)
F/U as before, call if you need me sooner  Please come by on 03/07/2019 early morning to collect return to work statement, discharge summary and lab order sheet for Wednesday, Mar 13, 2019  Please work on mental health , and if you decide to, I will refer you to therapist, I believe that you will benefit  Please get fasting CBC, lipid, cmp and EGFr, HBA1C, TSH, Vit D next week.Medication for reflux/ heartburn is prescribed for short term use, stress often precipitates heartburn  Thankful that you are better

## 2019-03-06 ENCOUNTER — Ambulatory Visit: Payer: Managed Care, Other (non HMO) | Admitting: Family Medicine

## 2019-03-14 LAB — VITAMIN D 25 HYDROXY (VIT D DEFICIENCY, FRACTURES): Vit D, 25-Hydroxy: 27 ng/mL — ABNORMAL LOW (ref 30–100)

## 2019-03-14 LAB — CBC
HCT: 38.7 % (ref 35.0–45.0)
Hemoglobin: 13 g/dL (ref 11.7–15.5)
MCH: 29.1 pg (ref 27.0–33.0)
MCHC: 33.6 g/dL (ref 32.0–36.0)
MCV: 86.8 fL (ref 80.0–100.0)
MPV: 10.9 fL (ref 7.5–12.5)
Platelets: 343 10*3/uL (ref 140–400)
RBC: 4.46 10*6/uL (ref 3.80–5.10)
RDW: 13 % (ref 11.0–15.0)
WBC: 3.9 10*3/uL (ref 3.8–10.8)

## 2019-03-14 LAB — HEMOGLOBIN A1C
Hgb A1c MFr Bld: 5.6 % of total Hgb (ref <5.7)
Mean Plasma Glucose: 114 (calc)
eAG (mmol/L): 6.3 (calc)

## 2019-03-14 LAB — COMPLETE METABOLIC PANEL WITH GFR
AG Ratio: 2 (calc) (ref 1.0–2.5)
ALT: 11 U/L (ref 6–29)
AST: 15 U/L (ref 10–35)
Albumin: 4.4 g/dL (ref 3.6–5.1)
Alkaline phosphatase (APISO): 66 U/L (ref 37–153)
BUN: 12 mg/dL (ref 7–25)
CO2: 28 mmol/L (ref 20–32)
Calcium: 9.5 mg/dL (ref 8.6–10.4)
Chloride: 105 mmol/L (ref 98–110)
Creat: 0.79 mg/dL (ref 0.50–1.05)
GFR, Est African American: 98 mL/min/{1.73_m2} (ref >=60)
GFR, Est Non African American: 84 mL/min/{1.73_m2} (ref >=60)
Globulin: 2.2 g/dL (calc) (ref 1.9–3.7)
Glucose, Bld: 83 mg/dL (ref 65–99)
Potassium: 4.1 mmol/L (ref 3.5–5.3)
Sodium: 141 mmol/L (ref 135–146)
Total Bilirubin: 0.9 mg/dL (ref 0.2–1.2)
Total Protein: 6.6 g/dL (ref 6.1–8.1)

## 2019-03-14 LAB — LIPID PANEL
Cholesterol: 219 mg/dL — ABNORMAL HIGH (ref <200)
HDL: 82 mg/dL (ref >=50)
LDL Cholesterol (Calc): 122 mg/dL (calc) — ABNORMAL HIGH
Non-HDL Cholesterol (Calc): 137 mg/dL (calc) — ABNORMAL HIGH (ref <130)
Total CHOL/HDL Ratio: 2.7 (calc) (ref <5.0)
Triglycerides: 55 mg/dL (ref <150)

## 2019-03-14 LAB — TSH: TSH: 4.73 mIU/L — ABNORMAL HIGH

## 2019-03-19 NOTE — Assessment & Plan Note (Signed)
Return to work with no restrictions. Afebrile, not set up for my charge messaging Discussed warning symptoms of acute deterioration requiring ED visit, she verbalizes understandiong

## 2019-03-19 NOTE — Assessment & Plan Note (Signed)
Will f/u at next visit, long history , most of which is driven by family dynamics and personal h/o dysfunctional family , also loneliness

## 2019-03-19 NOTE — Assessment & Plan Note (Signed)
Patient educated about the importance of limiting  Carbohydrate intake , the need to commit to daily physical activity for a minimum of 30 minutes , and to commit weight loss. The fact that changes in all these areas will reduce or eliminate all together the development of diabetes is stressed.  Has normalized in recent times which is great  Diabetic Labs Latest Ref Rng & Units 03/13/2019 05/28/2018 09/15/2017 01/05/2017 09/19/2016  HbA1c <5.7 % of total Hgb 5.6 5.6 5.4 - 5.3  Chol <200 mg/dL 219(H) 205(H) 200(H) - 196  HDL > OR = 50 mg/dL 82 64 64 - 73  Calc LDL mg/dL (calc) 122(H) 123(H) 121(H) - 114(H)  Triglycerides <150 mg/dL 55 85 61 - 44  Creatinine 0.50 - 1.05 mg/dL 0.79 0.77 0.77 0.82 0.70   BP/Weight 03/05/2019 02/27/2019 02/21/2019 05/31/2018 03/29/2018 03/20/2018 99991111  Systolic BP A999333 A999333 A999333 [id number] 123456 [id number]  Diastolic BP 82 83 70 82 80 82 87  Wt. (Lbs) 170 - 170 174 174.04 174.04 170  BMI 28.29 - 28.29 28.96 28.96 28.96 28.29   No flowsheet data found.

## 2019-03-19 NOTE — Assessment & Plan Note (Signed)
Updated lab needed at/ before next visit.   

## 2019-03-19 NOTE — Assessment & Plan Note (Signed)
Controlled, no change in medication DASH diet and commitment to daily physical activity for a minimum of 30 minutes discussed and encouraged, as a part of hypertension management. The importance of attaining a healthy weight is also discussed.  BP/Weight 03/05/2019 02/27/2019 02/21/2019 05/31/2018 03/29/2018 03/20/2018 99991111  Systolic BP A999333 A999333 A999333 [id number] 123456 [id number]  Diastolic BP 82 83 70 82 80 82 87  Wt. (Lbs) 170 - 170 174 174.04 174.04 170  BMI 28.29 - 28.29 28.96 28.96 28.96 28.29

## 2019-03-19 NOTE — Assessment & Plan Note (Signed)
Stable, recent use of rescue in  Keeping with infection

## 2019-04-04 ENCOUNTER — Other Ambulatory Visit: Payer: Self-pay | Admitting: Family Medicine

## 2019-04-04 DIAGNOSIS — I10 Essential (primary) hypertension: Secondary | ICD-10-CM

## 2019-08-15 ENCOUNTER — Other Ambulatory Visit: Payer: Self-pay | Admitting: Family Medicine

## 2019-08-19 ENCOUNTER — Other Ambulatory Visit: Payer: Self-pay | Admitting: Family Medicine

## 2019-08-19 DIAGNOSIS — Z1231 Encounter for screening mammogram for malignant neoplasm of breast: Secondary | ICD-10-CM

## 2019-08-21 ENCOUNTER — Ambulatory Visit: Payer: Managed Care, Other (non HMO) | Admitting: Family Medicine

## 2019-09-16 ENCOUNTER — Telehealth: Payer: Self-pay

## 2019-09-16 ENCOUNTER — Other Ambulatory Visit: Payer: Self-pay

## 2019-09-16 DIAGNOSIS — I10 Essential (primary) hypertension: Secondary | ICD-10-CM

## 2019-09-16 DIAGNOSIS — E785 Hyperlipidemia, unspecified: Secondary | ICD-10-CM

## 2019-09-16 DIAGNOSIS — E038 Other specified hypothyroidism: Secondary | ICD-10-CM

## 2019-09-16 NOTE — Telephone Encounter (Signed)
Fasting lipios, chem 7 and eGFr and tSH

## 2019-09-16 NOTE — Telephone Encounter (Signed)
Patient aware of labs needing to be drawn

## 2019-09-16 NOTE — Telephone Encounter (Signed)
Patient has an upcoming appt. Which labs would you like ordered for her?

## 2019-09-18 ENCOUNTER — Encounter: Payer: Self-pay | Admitting: Family Medicine

## 2019-09-18 ENCOUNTER — Other Ambulatory Visit: Payer: Self-pay

## 2019-09-18 ENCOUNTER — Ambulatory Visit (INDEPENDENT_AMBULATORY_CARE_PROVIDER_SITE_OTHER): Payer: Managed Care, Other (non HMO) | Admitting: Family Medicine

## 2019-09-18 VITALS — BP 124/80 | HR 72 | Resp 14 | Ht 65.0 in | Wt 166.1 lb

## 2019-09-18 DIAGNOSIS — E038 Other specified hypothyroidism: Secondary | ICD-10-CM

## 2019-09-18 DIAGNOSIS — E559 Vitamin D deficiency, unspecified: Secondary | ICD-10-CM | POA: Diagnosis not present

## 2019-09-18 DIAGNOSIS — E785 Hyperlipidemia, unspecified: Secondary | ICD-10-CM

## 2019-09-18 DIAGNOSIS — J4520 Mild intermittent asthma, uncomplicated: Secondary | ICD-10-CM

## 2019-09-18 DIAGNOSIS — Z23 Encounter for immunization: Secondary | ICD-10-CM | POA: Diagnosis not present

## 2019-09-18 DIAGNOSIS — I10 Essential (primary) hypertension: Secondary | ICD-10-CM | POA: Diagnosis not present

## 2019-09-18 DIAGNOSIS — E663 Overweight: Secondary | ICD-10-CM

## 2019-09-18 LAB — LIPID PANEL
Cholesterol: 232 mg/dL — ABNORMAL HIGH (ref <200)
HDL: 75 mg/dL (ref >=50)
LDL Cholesterol (Calc): 140 mg/dL (calc) — ABNORMAL HIGH
Non-HDL Cholesterol (Calc): 157 mg/dL (calc) — ABNORMAL HIGH (ref <130)
Total CHOL/HDL Ratio: 3.1 (calc) (ref <5.0)
Triglycerides: 71 mg/dL (ref <150)

## 2019-09-18 LAB — BASIC METABOLIC PANEL WITH GFR
BUN: 10 mg/dL (ref 7–25)
CO2: 26 mmol/L (ref 20–32)
Calcium: 9.5 mg/dL (ref 8.6–10.4)
Chloride: 106 mmol/L (ref 98–110)
Creat: 0.75 mg/dL (ref 0.50–1.05)
GFR, Est African American: 104 mL/min/{1.73_m2} (ref >=60)
GFR, Est Non African American: 90 mL/min/{1.73_m2} (ref >=60)
Glucose, Bld: 87 mg/dL (ref 65–99)
Potassium: 4.3 mmol/L (ref 3.5–5.3)
Sodium: 141 mmol/L (ref 135–146)

## 2019-09-18 LAB — TSH: TSH: 3.5 mIU/L

## 2019-09-18 NOTE — Patient Instructions (Addendum)
F/u in office with MD in 6 months, call if you need me before, shingrix #1 today  Nurse visit in 2 months for shingrix #2  Flu vaccine in 1 month   Please get your gyne exam and pap exam this is overdue  Pease get CBC, fasting lipid, cmp and eGFR, TSH and vit D early February  Please reduce fried and fatty foods, cholesterol is too high  Congrats on excellent blood pressure , and weight loss.  It is important that you exercise regularly at least 30 minutes 5 times a week. If you develop chest pain, have severe difficulty breathing, or feel very tired, stop exercising immediately and seek medical attention    Think about what you will eat, plan ahead. Choose " clean, green, fresh or frozen" over canned, processed or packaged foods which are more sugary, salty and fatty. 70 to 75% of food eaten should be vegetables and fruit. Three meals at set times with snacks allowed between meals, but they must be fruit or vegetables. Aim to eat over a 12 hour period , example 7 am to 7 pm, and STOP after  your last meal of the day. Drink water,generally about 64 ounces per day, no other drink is as healthy. Fruit juice is best enjoyed in a healthy way, by EATING the fruit.

## 2019-09-18 NOTE — Progress Notes (Signed)
   SYBRINA Cox     MRN: [id number]      DOB: January 07, 1966   HPI [name] is here for follow up and re-evaluation of chronic medical conditions, medication management and review of any available recent lab and radiology data.  Preventive health is updated, specifically  Cancer screening and Immunization.   Questions or concerns regarding consultations or procedures which the PT has had in the interim are  addressed. The PT denies any adverse reactions to current medications since the last visit.  There are no new concerns.  There are no specific complaints   ROS Denies recent fever or chills. Denies sinus pressure, nasal congestion, ear pain or sore throat. Denies chest congestion, productive cough or wheezing. Denies chest pains, palpitations and leg swelling Denies abdominal pain, nausea, vomiting,diarrhea or constipation.   Denies dysuria, frequency, hesitancy or incontinence. Denies joint pain, swelling and limitation in mobility. Denies headaches, seizures, numbness, or tingling. Denies depression, anxiety or insomnia. Denies skin break down or rash.   PE  BP 124/80   Pulse 72   Resp 14   Ht 5\' 5"  (1.651 m)   Wt 166 lb 1.3 oz (75.3 kg)   BMI 27.64 kg/m    Patient alert and oriented and in no cardiopulmonary distress.  HEENT: No facial asymmetry, EOMI,     Neck supple .  Chest: Clear to auscultation bilaterally.  CVS: S1, S2 no murmurs, no S3.Regular rate.  ABD: Soft non tender.   Ext: No edema  MS: Adequate ROM spine, shoulders, hips and knees.  Skin: Intact, no ulcerations or rash noted.  Psych: Good eye contact, normal affect. Memory intact not anxious or depressed appearing.  CNS: CN 2-12 intact, power,  normal throughout.no focal deficits noted.   Assessment & Plan  Hypothyroidism Controlled, no change in medication    Overweight (BMI 25.0-29.9)  Patient re-educated about  the importance of commitment to a  minimum of 150 minutes of exercise  per week as able.  The importance of healthy food choices with portion control discussed, as well as eating regularly and within a 12 hour window most days. The need to choose "clean , green" food 50 to 75% of the time is discussed, as well as to make water the primary drink and set a goal of 64 ounces water daily.    Weight /BMI 09/18/2019 03/05/2019 02/21/2019  WEIGHT 166 lb 1.3 oz 170 lb 170 lb  HEIGHT 5\' 5"  5\' 5"  5\' 5"   BMI 27.64 kg/m2 28.29 kg/m2 28.29 kg/m2  improved    Hyperlipidemia LDL goal <100 Hyperlipidemia:Low fat diet discussed and encouraged.   Lipid Panel  Lab Results  Component Value Date   CHOL 232 (H) 09/17/2019   HDL 75 09/17/2019   LDLCALC 140 (H) 09/17/2019   TRIG 71 09/17/2019   CHOLHDL 3.1 09/17/2019   Needs to reduce fat in diet    Asthma in adult Mild , uses rescue intermittently

## 2019-09-19 ENCOUNTER — Encounter: Payer: Self-pay | Admitting: Family Medicine

## 2019-09-19 NOTE — Assessment & Plan Note (Signed)
  Patient re-educated about  the importance of commitment to a  minimum of 150 minutes of exercise per week as able.  The importance of healthy food choices with portion control discussed, as well as eating regularly and within a 12 hour window most days. The need to choose "clean , green" food 50 to 75% of the time is discussed, as well as to make water the primary drink and set a goal of 64 ounces water daily.    Weight /BMI 09/18/2019 03/05/2019 02/21/2019  WEIGHT 166 lb 1.3 oz 170 lb 170 lb  HEIGHT 5\' 5"  5\' 5"  5\' 5"   BMI 27.64 kg/m2 28.29 kg/m2 28.29 kg/m2  improved

## 2019-09-19 NOTE — Assessment & Plan Note (Signed)
Mild , uses rescue intermittently

## 2019-09-19 NOTE — Assessment & Plan Note (Signed)
Controlled, no change in medication  

## 2019-09-19 NOTE — Assessment & Plan Note (Signed)
Hyperlipidemia:Low fat diet discussed and encouraged.   Lipid Panel  Lab Results  Component Value Date   CHOL 232 (H) 09/17/2019   HDL 75 09/17/2019   LDLCALC 140 (H) 09/17/2019   TRIG 71 09/17/2019   CHOLHDL 3.1 09/17/2019   Needs to reduce fat in diet

## 2019-09-23 ENCOUNTER — Other Ambulatory Visit: Payer: Self-pay

## 2019-09-23 ENCOUNTER — Ambulatory Visit
Admission: RE | Admit: 2019-09-23 | Discharge: 2019-09-23 | Disposition: A | Discharge status: Home / Self Care | Payer: Managed Care, Other (non HMO) | Source: Ambulatory Visit | Attending: Family Medicine | Admitting: Family Medicine

## 2019-09-23 DIAGNOSIS — Z1231 Encounter for screening mammogram for malignant neoplasm of breast: Secondary | ICD-10-CM

## 2019-09-24 ENCOUNTER — Other Ambulatory Visit: Payer: Self-pay | Admitting: Family Medicine

## 2019-09-24 DIAGNOSIS — R928 Other abnormal and inconclusive findings on diagnostic imaging of breast: Secondary | ICD-10-CM

## 2019-09-27 ENCOUNTER — Other Ambulatory Visit: Payer: Self-pay | Admitting: Family Medicine

## 2019-10-02 ENCOUNTER — Ambulatory Visit
Admission: RE | Admit: 2019-10-02 | Discharge: 2019-10-02 | Disposition: A | Discharge status: Home / Self Care | Payer: Managed Care, Other (non HMO) | Source: Ambulatory Visit | Attending: Family Medicine | Admitting: Family Medicine

## 2019-10-02 ENCOUNTER — Other Ambulatory Visit: Payer: Self-pay

## 2019-10-02 ENCOUNTER — Other Ambulatory Visit: Payer: Self-pay | Admitting: Family Medicine

## 2019-10-02 DIAGNOSIS — R928 Other abnormal and inconclusive findings on diagnostic imaging of breast: Secondary | ICD-10-CM

## 2019-10-15 ENCOUNTER — Ambulatory Visit
Admission: RE | Admit: 2019-10-15 | Discharge: 2019-10-15 | Disposition: A | Discharge status: Home / Self Care | Payer: Managed Care, Other (non HMO) | Source: Ambulatory Visit | Attending: Family Medicine | Admitting: Family Medicine

## 2019-10-15 ENCOUNTER — Other Ambulatory Visit: Payer: Self-pay | Admitting: Family Medicine

## 2019-10-15 ENCOUNTER — Other Ambulatory Visit: Payer: Self-pay

## 2019-10-15 DIAGNOSIS — R928 Other abnormal and inconclusive findings on diagnostic imaging of breast: Secondary | ICD-10-CM

## 2019-10-15 HISTORY — PX: BREAST BIOPSY: SHX20

## 2019-10-24 ENCOUNTER — Other Ambulatory Visit: Payer: Self-pay | Admitting: Family Medicine

## 2019-10-24 ENCOUNTER — Ambulatory Visit
Admission: RE | Admit: 2019-10-24 | Discharge: 2019-10-24 | Disposition: A | Discharge status: Home / Self Care | Payer: Managed Care, Other (non HMO) | Source: Ambulatory Visit | Attending: Family Medicine | Admitting: Family Medicine

## 2019-10-24 ENCOUNTER — Other Ambulatory Visit: Payer: Self-pay

## 2019-10-24 DIAGNOSIS — N6320 Unspecified lump in the left breast, unspecified quadrant: Secondary | ICD-10-CM

## 2019-10-24 DIAGNOSIS — R928 Other abnormal and inconclusive findings on diagnostic imaging of breast: Secondary | ICD-10-CM

## 2019-10-24 HISTORY — PX: BREAST BIOPSY: SHX20

## 2019-11-04 IMAGING — DX DG ABDOMEN ACUTE W/ 1V CHEST
3 series · 3 of 3 positions shown · non-contrast
Comparison: Chest x-ray 07/22/2017.

CLINICAL DATA: Hematuria.  History of kidney stone.

EXAM:
DG ABDOMEN ACUTE W/ 1V CHEST

[chest pa]
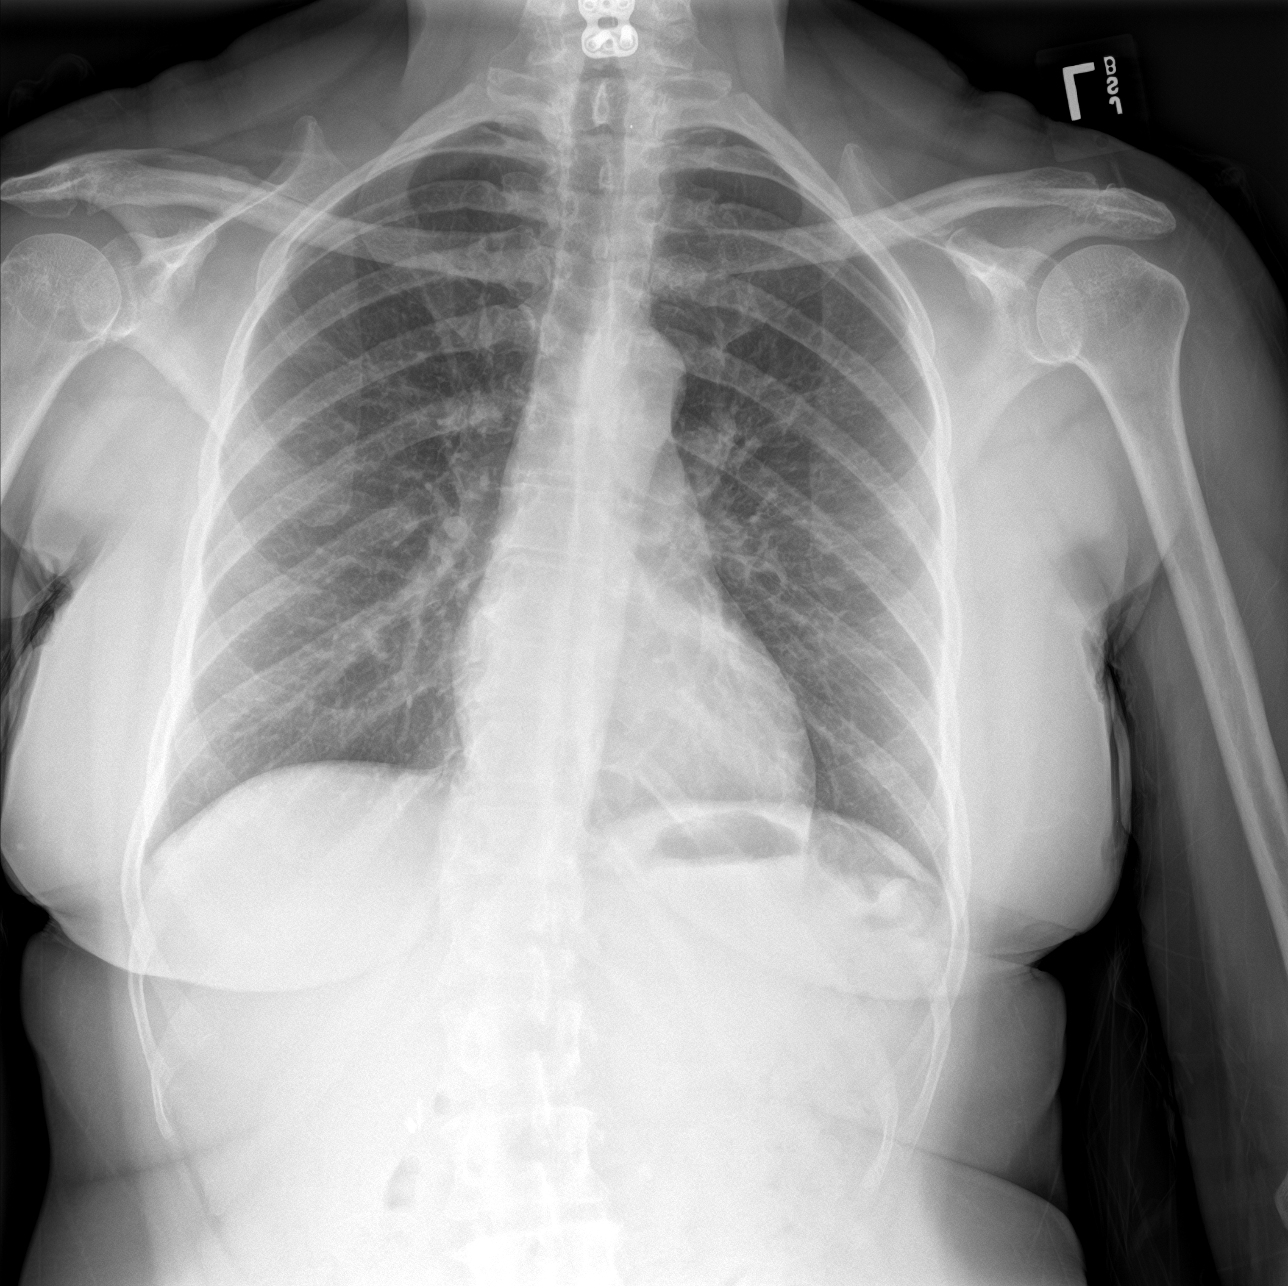

[abdomen erect]
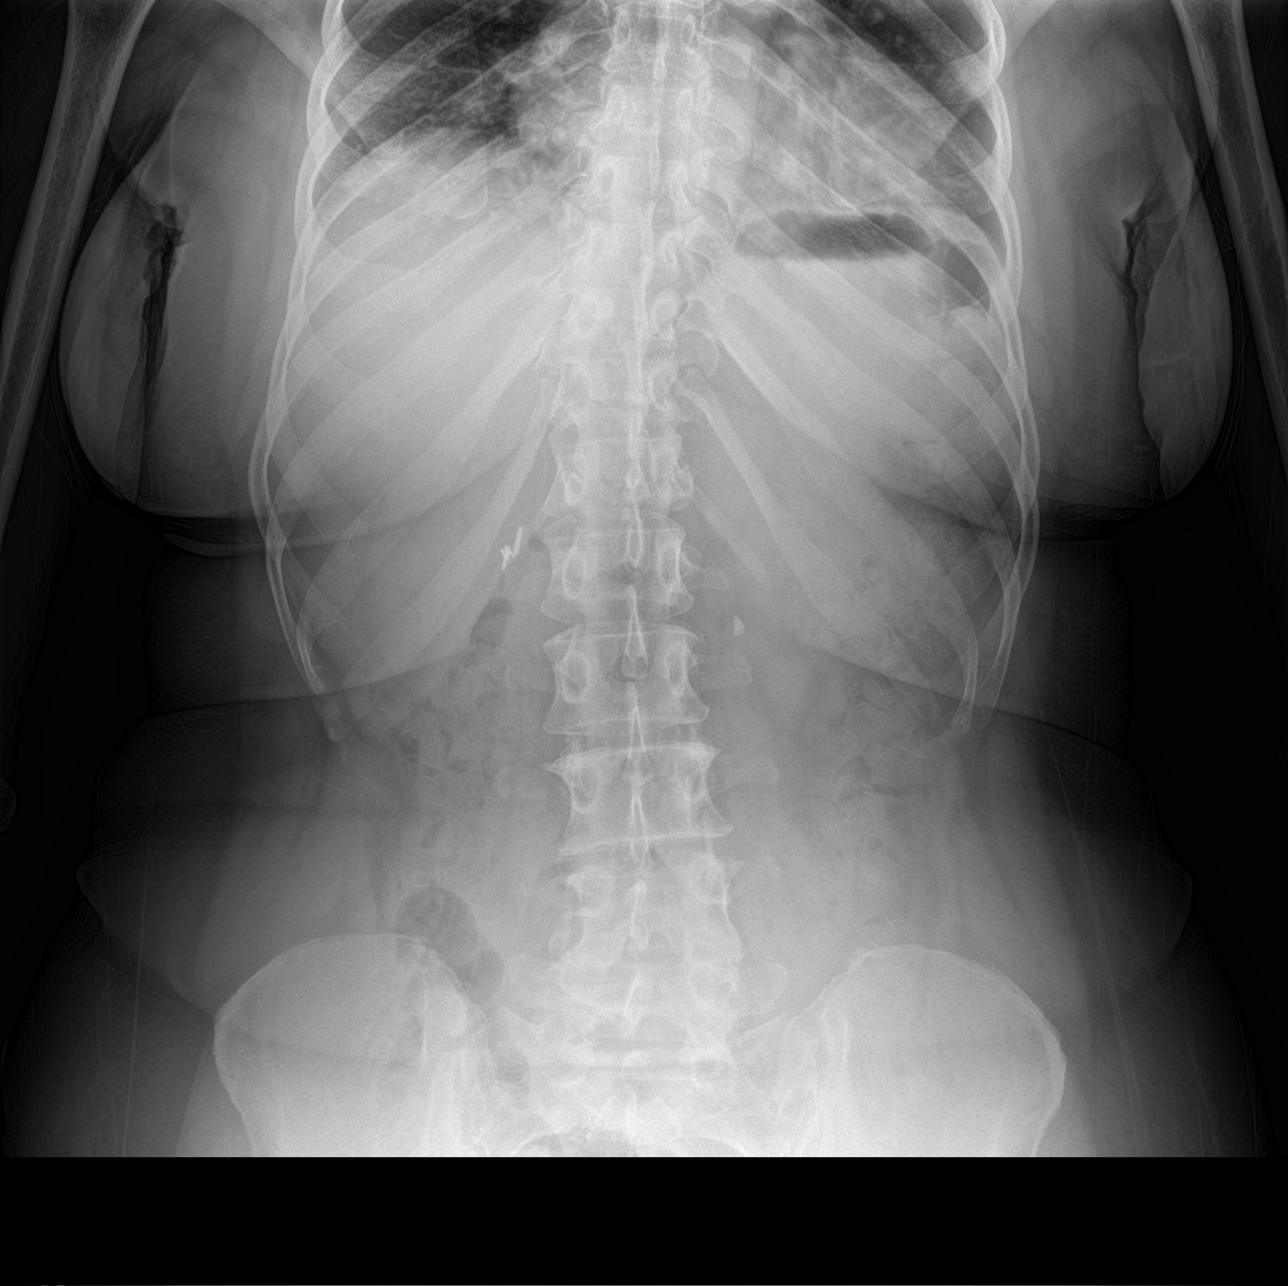

[abdomen supine]
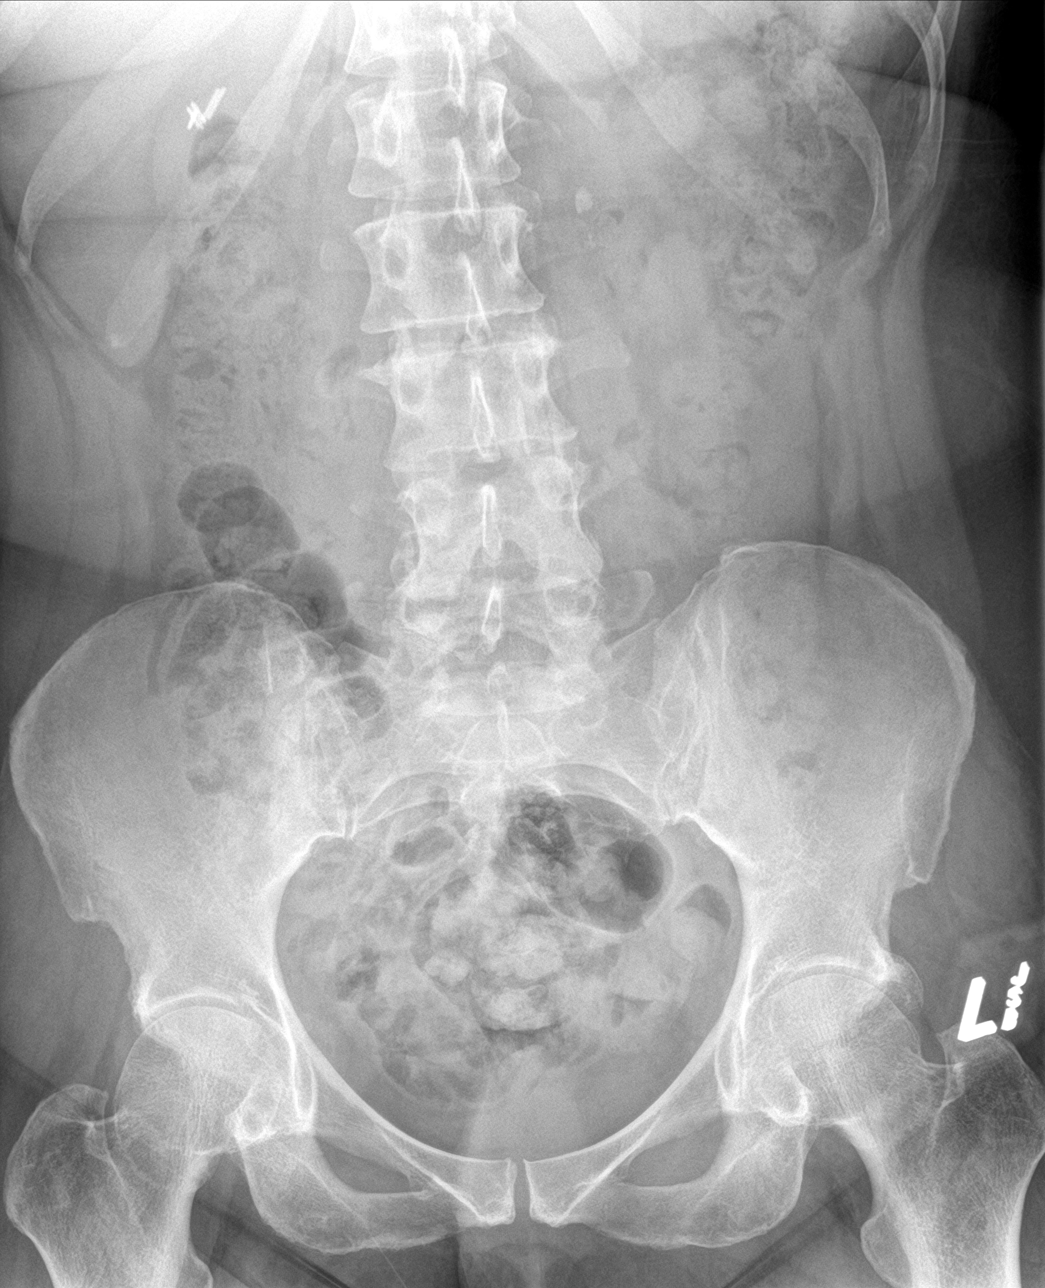

[3 of 3 positions shown; findings below may reference images not displayed]

FINDINGS: Mediastinum hilar structures normal. Lungs are clear. No pleural
effusion or pneumothorax. Heart size normal. Prior cervical spine
fusion. Cervicothoracic spine fusion. Thoracolumbar spine scoliosis
and degenerative change. Degenerative changes both hips.

Surgical clips right upper quadrant. 7 mm calcific density noted
over the left upper flank. This is consistent with a left upper
ureteral stone. No bowel distention. Stool noted throughout the
colon. No free air.
IMPRESSION: 1. 7 mm calcific density noted over the left upper flank. This
consistent with a left upper ureteral stone.

2.  No acute cardiopulmonary disease.

## 2019-11-13 IMAGING — DX ABDOMEN - 1 VIEW
2 series · 2 of 2 positions shown · non-contrast
Comparison: Body CT 03/22/2018

CLINICAL DATA: Left ureteral calculus.

EXAM:
ABDOMEN - 1 VIEW

[abdomen kub (1 of 2)]
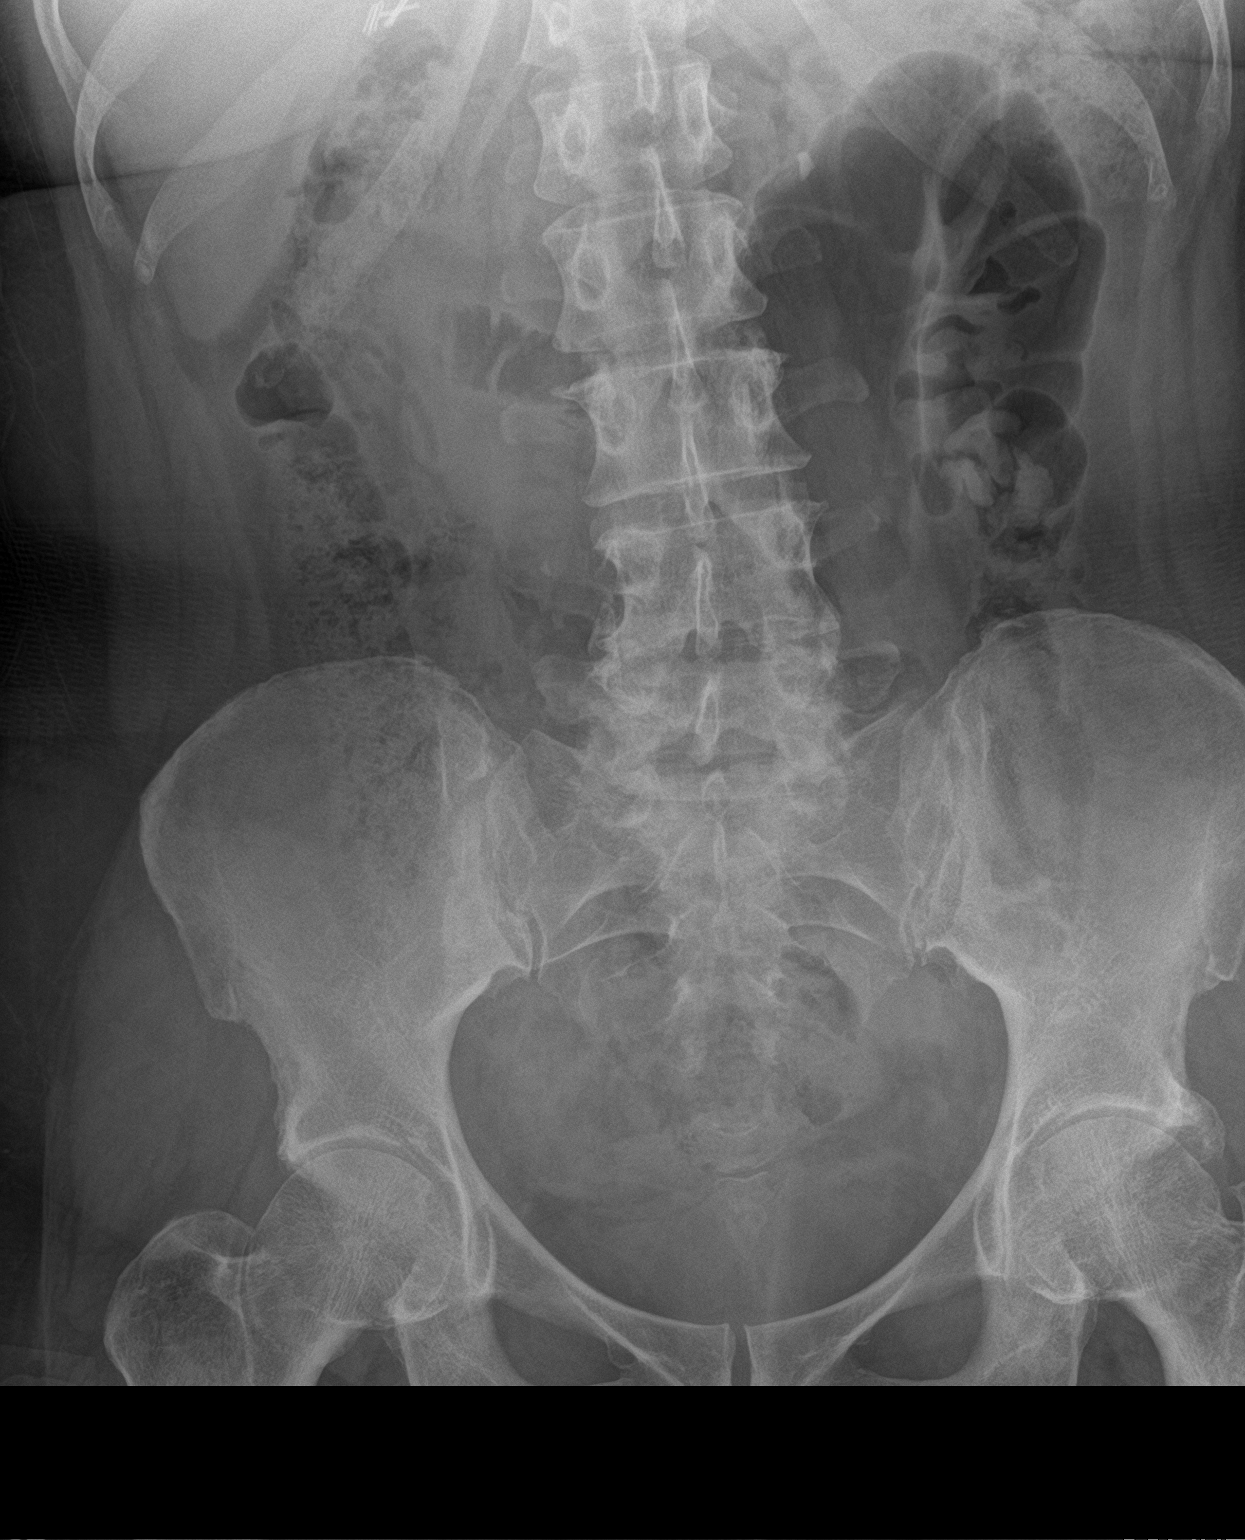

[abdomen kub (2 of 2)]
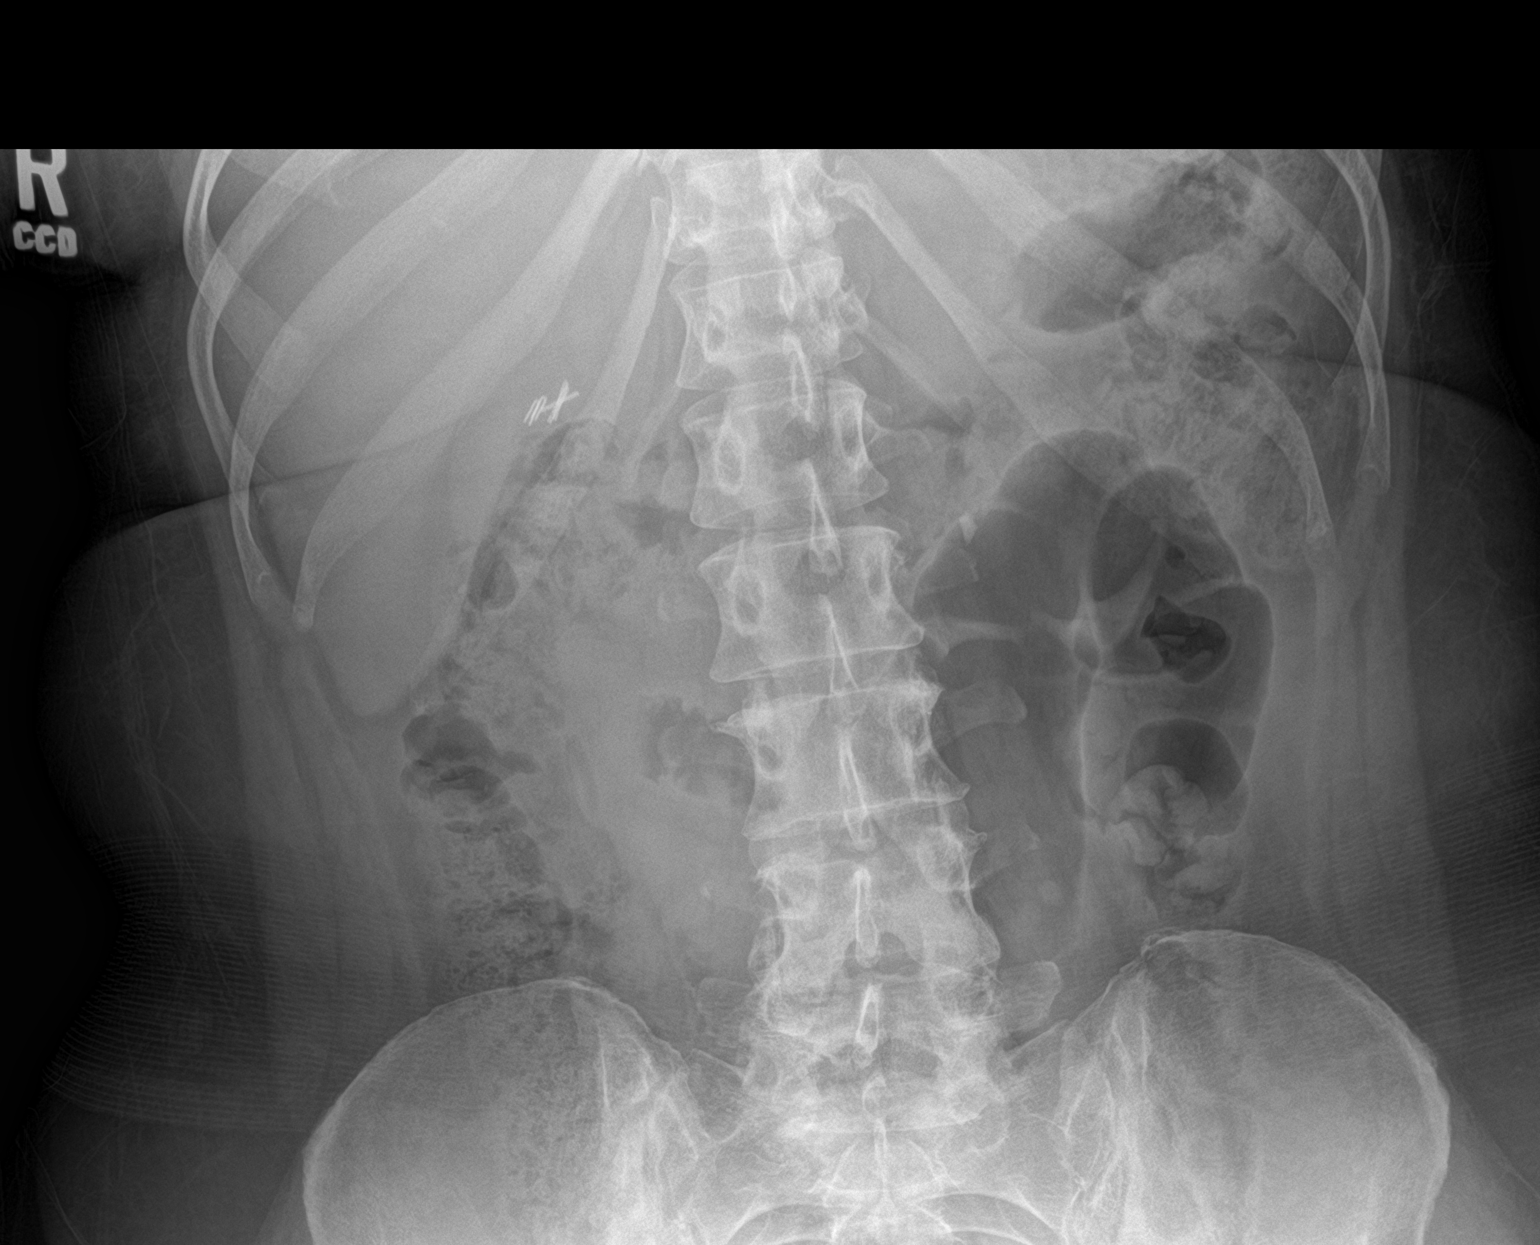

[2 of 2 positions shown; findings below may reference images not displayed]

FINDINGS: The bowel gas pattern is normal. Moderate amount of formed stool
throughout the colon.

Post cholecystectomy.

Proximal left ureteral calculus is in stable position bordering L2
superior endplate.
IMPRESSION: Stable position of proximal left ureteral calculus.

## 2019-11-14 ENCOUNTER — Other Ambulatory Visit: Payer: Self-pay | Admitting: Family Medicine

## 2019-12-23 ENCOUNTER — Other Ambulatory Visit: Payer: Self-pay | Admitting: Family Medicine

## 2020-01-09 ENCOUNTER — Encounter: Payer: Self-pay | Admitting: Adult Health

## 2020-02-05 ENCOUNTER — Other Ambulatory Visit: Payer: Self-pay

## 2020-02-05 ENCOUNTER — Encounter: Payer: Self-pay | Admitting: Adult Health

## 2020-02-05 ENCOUNTER — Ambulatory Visit (INDEPENDENT_AMBULATORY_CARE_PROVIDER_SITE_OTHER): Payer: Managed Care, Other (non HMO) | Admitting: Adult Health

## 2020-02-05 VITALS — BP 124/85 | HR 99 | Ht 64.25 in | Wt 172.0 lb

## 2020-02-05 DIAGNOSIS — Z01419 Encounter for gynecological examination (general) (routine) without abnormal findings: Secondary | ICD-10-CM | POA: Diagnosis not present

## 2020-02-05 DIAGNOSIS — D1723 Benign lipomatous neoplasm of skin and subcutaneous tissue of right leg: Secondary | ICD-10-CM

## 2020-02-05 DIAGNOSIS — Z1211 Encounter for screening for malignant neoplasm of colon: Secondary | ICD-10-CM | POA: Insufficient documentation

## 2020-02-05 LAB — HEMOCCULT GUIAC POC 1CARD (OFFICE): Fecal Occult Blood, POC: NEGATIVE

## 2020-02-05 NOTE — Progress Notes (Signed)
Patient ID: Gail Cox, female   DOB: Mar 23, 1965, 55 y.o.   MRN: [id number] History of Present Illness:  Gail Cox is a 55 year old black female,single, sp hysterectomy in for well woman gyn exam PCP is Dr Gail Cox.  Current Medications, Allergies, Past Medical History, Past Surgical History, Family History and Social History were reviewed in [employer] record.     Review of Systems: Patient denies any headaches, hearing loss, fatigue, blurred vision, shortness of breath, chest pain, abdominal pain, problems with bowel movements, urination, or intercourse. No joint pain. She has some hot flashes and is irritable but thinks it is work    Physical Exam:BP 124/85 (BP Location: Left Arm, Patient Position: Sitting, Cuff Size: Normal)   Pulse 99   Ht 5' 4.25" (1.632 m)   Wt 172 lb (78 kg)   BMI 29.29 kg/m  General:  Well developed, well nourished, no acute distress Skin:  Warm and dry Neck:  Midline trachea, thyroid is surgically absent, good ROM, no lymphadenopathy Lungs; Clear to auscultation bilaterally Breast:  No dominant palpable mass, retraction, or nipple discharge.she has a 8 mm black mole on right side of upper chest, she says it has not changed and her mom and grandma has one in same place. Cardiovascular: Regular rate and rhythm Abdomen:  Soft, non tender, no hepatosplenomegaly Pelvic:  External genitalia is normal in appearance, no lesions. She has a soft 3-4 cm lipoma right inner thigh. The vagina is pale pink with loss of moisture and rugae. Urethra has no lesions or masses. The cervix and uterus are absent.  No adnexal masses or tenderness noted.Bladder is non tender, no masses felt. Rectal: Good sphincter tone, no polyps, or hemorrhoids felt.  Hemoccult negative. Extremities/musculoskeletal:  No swelling or varicosities noted, no clubbing or cyanosis Psych:  No Cox changes, alert and cooperative,seems happy AA is 0 Fall risk is low PHQ 9 score  is 12,no SI,declines labs GAD 7 score is 11 declines meds  [facility] - 02/05/20 1128      Pregnancy Intention Screening   Does the patient want to become pregnant in the next year? N/A    Does the patient's partner want to become pregnant in the next year? N/A    Would the patient like to discuss contraceptive options today? N/A      Contraception Wrap Up   Current Method No Method - Other Reason   hyst   End Method No Method - Other Reason   hyst   Contraception Counseling Provided No         Examination chaperoned by Gail Mood LPN  Impression and Plan: 1. Encounter for well woman exam with routine gynecological exam GYN physical in 1 year Labs per PCP Mammogram yearly Colonoscopy per GI  2. Encounter for screening fecal occult blood testing   3. Lipoma of right lower extremity If she wants removed can refer to general surgeon

## 2020-02-06 ENCOUNTER — Other Ambulatory Visit: Payer: Self-pay | Admitting: Family Medicine

## 2020-02-07 ENCOUNTER — Other Ambulatory Visit: Payer: Managed Care, Other (non HMO) | Admitting: Adult Health

## 2020-03-20 ENCOUNTER — Other Ambulatory Visit: Payer: Self-pay | Admitting: Family Medicine

## 2020-03-29 ENCOUNTER — Other Ambulatory Visit: Payer: Self-pay | Admitting: Family Medicine

## 2020-03-29 DIAGNOSIS — I10 Essential (primary) hypertension: Secondary | ICD-10-CM

## 2020-04-30 ENCOUNTER — Other Ambulatory Visit: Payer: Self-pay | Admitting: Family Medicine

## 2020-05-27 ENCOUNTER — Ambulatory Visit (HOSPITAL_COMMUNITY): Payer: Managed Care, Other (non HMO) | Attending: Orthopedic Surgery | Admitting: Physical Therapy

## 2020-05-27 ENCOUNTER — Other Ambulatory Visit: Payer: Self-pay

## 2020-05-27 ENCOUNTER — Encounter (HOSPITAL_COMMUNITY): Payer: Self-pay | Admitting: Physical Therapy

## 2020-05-27 DIAGNOSIS — M25561 Pain in right knee: Secondary | ICD-10-CM | POA: Diagnosis present

## 2020-05-27 DIAGNOSIS — M6281 Muscle weakness (generalized): Secondary | ICD-10-CM | POA: Diagnosis not present

## 2020-05-27 DIAGNOSIS — R262 Difficulty in walking, not elsewhere classified: Secondary | ICD-10-CM | POA: Insufficient documentation

## 2020-05-27 DIAGNOSIS — G8929 Other chronic pain: Secondary | ICD-10-CM | POA: Diagnosis present

## 2020-05-28 NOTE — Therapy (Signed)
[facility] [address] [place], [place], [address] Phone: [phone]   Fax:  [phone]  Physical Therapy Evaluation  Patient Details  Name: Gail Cox MRN: [id number] Date of Birth: October 07, 1965 Referring Provider (PT): Teryl Lucy MD   Encounter Date: 05/27/2020   PT End of Session - 05/27/20 1532    Visit Number 1    Number of Visits 8    Date for PT Re-Evaluation 07/08/20    Authorization Type [employer] Managed, no auth req no VL    Progress Note Due on Visit 10    PT Start Time 1533    PT Stop Time 1613    PT Time Calculation (min) 40 min    Activity Tolerance Patient tolerated treatment well    Behavior During Therapy [facility] for tasks assessed/performed           Past Medical History:  Diagnosis Date  . Allergic rhinitis, seasonal   . Asthma   . History of kidney stones   . Hypothyroidism   . Migraine headache     Past Surgical History:  Procedure Laterality Date  . ABDOMINAL HYSTERECTOMY     partial hysterectomy  . BREAST BIOPSY Left   . BREAST CYST EXCISION Left   . CHOLECYSTECTOMY  1999  . COLONOSCOPY  09/21/2011   Procedure: COLONOSCOPY;  Surgeon: Malissa Hippo, MD;  [facility];  Service: Endoscopy;  Laterality: N/A;  830  . COLONOSCOPY N/A 11/11/2016   Procedure: COLONOSCOPY;  Surgeon: Malissa Hippo, MD;  [facility];  Service: Endoscopy;  Laterality: N/A;  1155  . CYSTECTOMY  1977   Right arm  . CYSTECTOMY  2007   Left breast  . EXTRACORPOREAL SHOCK WAVE LITHOTRIPSY Left 03/29/2018   Procedure: EXTRACORPOREAL SHOCK WAVE LITHOTRIPSY (ESWL);  Surgeon: Heloise Purpura, MD;  Location: [facility];  Service: Urology;  Laterality: Left;  . KNEE ARTHROSCOPY Bilateral    bilateral partial knee replacement  . OVARIAN CYST SURGERY  2003   Removed and uterus  . SPINE SURGERY  2016   cervical spine  . THYROIDECTOMY  2006    There were no vitals filed for this visit.    Subjective  Assessment - 05/28/20 0745    Subjective States she had a partial knee replacement and during therapy (at this location) states that she was doing the leg press and they started on a high amount of weight and she had a pop in her knee and a sharp pain on the side of her right knee. States the therapist then decreased the weight on the machine but her knee has never been the same. Stats that it is a constant nag and then after standing all day she has increased pain. Reports she has had a couple shots in her knee and they help but the pain comes back. 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 States the surgeon told her she had to try PT for at least 6 visits and she is only here because she has to come on her work break.    Pertinent History right partial knee TAK 12/2017    Patient Stated Goals to go up stairs without pain    Currently in Pain? Yes    Pain Score 5     Pain Location Knee    Pain Orientation Right;Lateral    Pain Descriptors / Indicators Aching;Nagging;Stabbing    Pain Radiating Towards  up leg    Aggravating Factors  stairs, standing    Pain Relieving Factors injections, massage              [facility] PT Assessment - 05/28/20 0001      Assessment   Medical Diagnosis Right IT band    Referring Provider (PT) Marchia Bond MD    Prior Therapy yes for right partial knee replacement in 2019      Precautions   Precautions None      Balance Screen   Has the patient fallen in the past 6 months No      [place] residence    Living Arrangements Alone    Available Help at Discharge Friend(s);Family    Home Access Stairs to enter    Entrance Stairs-Number of Steps 2    Home Layout One level    [place] - 2 wheels;Cane - single point      Prior Function   Level of Independence Independent      Cognition   Overall Cognitive Status Within Functional Limits for tasks assessed       Observation/Other Assessments   Focus on Therapeutic Outcomes (FOTO)  NA      AROM   Right Hip External Rotation  45    Right Hip Internal Rotation  60    Left Hip External Rotation  45    Left Hip Internal Rotation  45    Right Knee Extension 5   lacking   Right Knee Flexion 120    Left Knee Extension 0    Left Knee Flexion 130      Strength   Strength Assessment Site Knee;Hip    Right/Left Hip Right;Left    Right Hip ABduction 3+/5   pain at knee, severely compensates with hip flexion   Left Hip ABduction 4-/5    Right/Left Knee Right;Left    Right Knee Flexion 3+/5   pain at knee   Right Knee Extension 4/5    Left Knee Flexion 4+/5    Left Knee Extension 5/5      Palpation   Palpation comment tenderness noted along right lateral hamstring, ITB and abductors.      Special Tests    Special Tests Hip Special Tests    Hip Special Tests  Ely's Test      Ely's Test   Findings Positive    Side --   both R>L   Comments right could not break 90, did not feel stretch on thigh but hip hike noted                      Objective measurements completed on examination: See above findings.               PT Education - 05/27/20 1613    Education Details on PT, on rehab process, on anatomy, on rationale for exercises and plan moving forward    Person(s) Educated Patient    Methods Explanation    Comprehension Verbalized understanding            PT Short Term Goals - 05/28/20 0736      PT SHORT TERM GOAL #1   Title Patient will report at least 25% improvement in overall symptoms and/or function to demonstrate improved functional mobility    Time 3    Period Weeks    Status New    Target Date 06/17/20  PT SHORT TERM GOAL #2   Title Patient will be independent in self management strategies to improve quality of life and functional outcomes.    Time 3    Period Weeks    Status New    Target Date 06/17/20      PT SHORT TERM GOAL #3   Title  Patient will be able to demonstrate 2-125  in right knee to improve gross knee mobility    Time 3    Period Weeks    Status New    Target Date 06/17/20             PT Long Term Goals - 05/28/20 0736      PT LONG TERM GOAL #1   Title Patient will be able to ascend and escend stairs with reciprocal gait and use of one railling as needed with < 3/10 pain    Time 6    Period Weeks    Status New    Target Date 07/08/20      PT LONG TERM GOAL #2   Title Patient will have no pain wiht resisted hamstring MMT    Time 6    Period Weeks    Status New    Target Date 07/08/20      PT LONG TERM GOAL #3   Title Patient will report at least 50% improvement in overall symptoms and/or function to demonstrate improved functional mobility    Time 6    Period Weeks    Status New    Target Date 07/08/20                  Plan - 05/28/20 6433    Clinical Impression Statement Patient presents to therapy with complaints of chronic right knee pain after an incident where her knee popped over a year ago while on the leg press. She presents with referred pain to her knee with palpation of distal lateral hamstrings and with resisted right knee flexion. Educated patient on current POC and plan of PT moving forward. Educated patient in difference between muscular pain and arthritic pain. 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    Personal Factors and Comorbidities Comorbidity 1    Comorbidities hx right parital knee TKA    Examination-Activity Limitations Stairs;Locomotion Level;Lift;Stand    Examination-Participation Restrictions Occupation;Community Activity;Cleaning    Stability/Clinical Decision Making Stable/Uncomplicated    Clinical Decision Making Low    Rehab Potential Fair    PT Frequency --    1-2x/week for total of 8 visits over 6 weeks.   PT Duration 6 weeks    PT Treatment/Interventions ADLs/Self Care Home Management;Aquatic Therapy;Biofeedback;Moist Heat;Balance training;Therapeutic exercise;Therapeutic activities;Manual techniques;Electrical Stimulation;Cryotherapy;Patient/family education;Dry needling;Joint Manipulations    PT Next Visit Plan STM to right hamstring/glutes/ITB. knee ROM, hamstring/calf/hip strengthening    PT Home Exercise Plan right hamstring stretch    Consulted and Agree with Plan of Care Patient           Patient will benefit from skilled therapeutic intervention in order to improve the following deficits and impairments:  Pain,Difficulty walking,Decreased range of motion,Decreased mobility,Decreased strength,Decreased activity tolerance,Decreased endurance  Visit Diagnosis: Muscle weakness (generalized)  Difficulty in walking, not elsewhere classified  Chronic pain of right knee     Problem List Patient Active Problem List   Diagnosis Date Noted  . Lipoma  of right lower extremity 02/05/2020  . Encounter for screening fecal occult blood testing 02/05/2020  . Encounter for well woman exam with routine gynecological exam 02/05/2020  . Lab test positive for detection of COVID-19 virus 02/24/2019  . Abnormal EKG 11/08/2017  . Insomnia 09/20/2017  . Family history of colon cancer 10/10/2016  . IGT (impaired glucose tolerance) 08/10/2015  . Hyperlipidemia LDL goal <100 02/11/2014  . Essential hypertension 07/01/2013  . Osteoarthritis of both knees 09/20/2012  . Asthma in adult 02/27/2012  . Seasonal allergies 02/27/2012  . Overweight (BMI 25.0-29.9) 11/05/2011  . Hypothyroidism 06/06/2007  . Migraine with aura 06/06/2007  . ALLERGIC RHINITIS, SEASONAL 06/06/2007    8:26 AM, 05/28/20 Jerene Pitch, DPT Physical Therapy with [facility]  [phone] office  [place] [address] [place], [place], [address] Phone: [phone]   Fax:  [phone]  Name: Gail Cox MRN: [id number] Date of Birth: [birth year]

## 2020-06-02 ENCOUNTER — Ambulatory Visit (HOSPITAL_COMMUNITY): Payer: Managed Care, Other (non HMO) | Attending: Orthopedic Surgery | Admitting: Physical Therapy

## 2020-06-02 ENCOUNTER — Other Ambulatory Visit: Payer: Self-pay

## 2020-06-02 ENCOUNTER — Encounter (HOSPITAL_COMMUNITY): Payer: Self-pay | Admitting: Physical Therapy

## 2020-06-02 DIAGNOSIS — M25561 Pain in right knee: Secondary | ICD-10-CM | POA: Insufficient documentation

## 2020-06-02 DIAGNOSIS — M6281 Muscle weakness (generalized): Secondary | ICD-10-CM | POA: Diagnosis not present

## 2020-06-02 DIAGNOSIS — G8929 Other chronic pain: Secondary | ICD-10-CM | POA: Insufficient documentation

## 2020-06-02 DIAGNOSIS — R262 Difficulty in walking, not elsewhere classified: Secondary | ICD-10-CM | POA: Diagnosis present

## 2020-06-02 NOTE — Therapy (Signed)
[place] [address] [place], [place], [address] Phone: [phone]   Fax:  [phone]  Physical Therapy Treatment  Patient Details  Name: Gail Cox MRN: [id number] Date of Birth: October 05, 1965 Referring Provider (PT): Marchia Bond MD   Encounter Date: 06/02/2020   PT End of Session - 06/02/20 1358    Visit Number 2    Number of Visits 8    Date for PT Re-Evaluation 07/08/20    Authorization Type [employer] Managed, no auth req no VL    Progress Note Due on Visit 10    PT Start Time 1400    PT Stop Time 1440    PT Time Calculation (min) 40 min    Activity Tolerance Patient tolerated treatment well    Behavior During Therapy [facility] for tasks assessed/performed           Past Medical History:  Diagnosis Date  . Allergic rhinitis, seasonal   . Asthma   . History of kidney stones   . Hypothyroidism   . Migraine headache     Past Surgical History:  Procedure Laterality Date  . ABDOMINAL HYSTERECTOMY     partial hysterectomy  . BREAST BIOPSY Left   . BREAST CYST EXCISION Left   . CHOLECYSTECTOMY  1999  . COLONOSCOPY  09/21/2011   Procedure: COLONOSCOPY;  Surgeon: Rogene Houston, MD;  [facility];  Service: Endoscopy;  Laterality: N/A;  830  . COLONOSCOPY N/A 11/11/2016   Procedure: COLONOSCOPY;  Surgeon: Rogene Houston, MD;  [facility];  Service: Endoscopy;  Laterality: N/A;  1155  . CYSTECTOMY  1977   Right arm  . CYSTECTOMY  2007   Left breast  . EXTRACORPOREAL SHOCK WAVE LITHOTRIPSY Left 03/29/2018   Procedure: EXTRACORPOREAL SHOCK WAVE LITHOTRIPSY (ESWL);  Surgeon: Raynelle Bring, MD;  [facility];  Service: Urology;  Laterality: Left;  . KNEE ARTHROSCOPY Bilateral    bilateral partial knee replacement  . OVARIAN CYST SURGERY  2003   Removed and uterus  . SPINE SURGERY  2016   cervical spine  . THYROIDECTOMY  2006    There were no vitals filed for this visit.   Subjective  Assessment - 06/02/20 1357    Subjective Pt has no complaints, completed HEP.    Pertinent History right partial knee TAK 12/2017    Patient Stated Goals to go up stairs without pain    Currently in Pain? Yes    Pain Score 3     Pain Location Knee    Pain Orientation Right;Lateral    Pain Descriptors / Indicators Stabbing;Nagging    Pain Type Chronic pain    Pain Onset More than a month ago    Pain Frequency Intermittent    Aggravating Factors  steps and WB    Pain Relieving Factors injectionand massage.                 [place] Adult PT Treatment/Exercise - 06/02/20 0001      Exercises   Exercises Knee/Hip      Knee/Hip Exercises: Stretches   Active Hamstring Stretch Right;3 reps;30 seconds    Quad Stretch Right;3 reps;30 seconds    Quad Stretch Limitations PROM      Knee/Hip Exercises: Standing   Heel Raises Both;10 reps    Knee Flexion Right;10 reps    SLS x4 reps; max of 6" on each      Knee/Hip Exercises: Seated   Long Arc  Quad Right;10 reps      Knee/Hip Exercises: Supine   Quad Sets Right;10 reps    Straight Leg Raises Strengthening;Right;10 reps      Knee/Hip Exercises: Prone   Hamstring Curl 10 reps    Hip Extension Strengthening;Both;10 reps      Manual Therapy   Manual Therapy Soft tissue mobilization    Manual therapy comments effluage, petrissage and trigger point pressure to reduce tightness of lateral soft tissue    Soft tissue mobilization to decrease pain                    PT Short Term Goals - 06/02/20 1408      PT SHORT TERM GOAL #1   Title Patient will report at least 25% improvement in overall symptoms and/or function to demonstrate improved functional mobility    Time 3    Period Weeks    Status On-going    Target Date 06/17/20      PT SHORT TERM GOAL #2   Title Patient will be independent in self management strategies to improve quality of life and functional outcomes.    Time 3    Period Weeks    Status On-going     Target Date 06/17/20      PT SHORT TERM GOAL #3   Title Patient will be able to demonstrate 2-125  in right knee to improve gross knee mobility    Time 3    Period Weeks    Status On-going    Target Date 06/17/20             PT Long Term Goals - 06/02/20 1409      PT LONG TERM GOAL #1   Title Patient will be able to ascend and escend stairs with reciprocal gait and use of one railling as needed with < 3/10 pain    Time 6    Period Weeks    Status On-going      PT LONG TERM GOAL #2   Title Patient will have no pain wiht resisted hamstring MMT    Time 6    Period Weeks    Status On-going      PT LONG TERM GOAL #3   Title Patient will report at least 50% improvement in overall symptoms and/or function to demonstrate improved functional mobility    Time 6    Period Weeks    Status On-going      PT LONG TERM GOAL #4   Status On-going                 Plan - 06/02/20 1358    Clinical Impression Statement Evaluation and goals reviewed with pt.  Began strengthening exercises and updated HEP.  Pt has good form with current exercises.    Personal Factors and Comorbidities Comorbidity 1    Comorbidities hx right parital knee TKA    Examination-Activity Limitations Stairs;Locomotion Level;Lift;Stand    Examination-Participation Restrictions Occupation;Community Activity;Cleaning    Stability/Clinical Decision Making Stable/Uncomplicated    Rehab Potential Fair    PT Frequency --   1-2x/week for total of 8 visits over 6 weeks.   PT Duration 6 weeks    PT Treatment/Interventions ADLs/Self Care Home Management;Aquatic Therapy;Biofeedback;Moist Heat;Balance training;Therapeutic exercise;Therapeutic activities;Manual techniques;Electrical Stimulation;Cryotherapy;Patient/family education;Dry needling;Joint Manipulations    PT Next Visit Plan STM to right hamstring/glutes/ITB. knee ROM, hamstring/calf/hip strengthening    PT Home Exercise Plan right hamstring stretch     Consulted and Agree with Plan  of Care Patient           Patient will benefit from skilled therapeutic intervention in order to improve the following deficits and impairments:  Pain,Difficulty walking,Decreased range of motion,Decreased mobility,Decreased strength,Decreased activity tolerance,Decreased endurance  Visit Diagnosis: Muscle weakness (generalized)  Difficulty in walking, not elsewhere classified  Chronic pain of right knee     Problem List Patient Active Problem List   Diagnosis Date Noted  . Lipoma of right lower extremity 02/05/2020  . Encounter for screening fecal occult blood testing 02/05/2020  . Encounter for well woman exam with routine gynecological exam 02/05/2020  . Lab test positive for detection of COVID-19 virus 02/24/2019  . Abnormal EKG 11/08/2017  . Insomnia 09/20/2017  . Family history of colon cancer 10/10/2016  . IGT (impaired glucose tolerance) 08/10/2015  . Hyperlipidemia LDL goal <100 02/11/2014  . Essential hypertension 07/01/2013  . Osteoarthritis of both knees 09/20/2012  . Asthma in adult 02/27/2012  . Seasonal allergies 02/27/2012  . Overweight (BMI 25.0-29.9) 11/05/2011  . Hypothyroidism 06/06/2007  . Migraine with aura 06/06/2007  . ALLERGIC RHINITIS, SEASONAL 06/06/2007  Rayetta Humphrey, PT CLT [phone] 06/02/2020, 2:47 PM  [place] [address] [place], [place], [address] Phone: [phone]   Fax:  [phone]  Name: Gail Cox MRN: [id number] Date of Birth: 16-Dec-1965

## 2020-06-09 ENCOUNTER — Other Ambulatory Visit: Payer: Self-pay

## 2020-06-09 ENCOUNTER — Encounter (HOSPITAL_COMMUNITY): Payer: Self-pay | Admitting: Physical Therapy

## 2020-06-09 ENCOUNTER — Ambulatory Visit (HOSPITAL_COMMUNITY): Payer: Managed Care, Other (non HMO) | Admitting: Physical Therapy

## 2020-06-09 ENCOUNTER — Other Ambulatory Visit: Payer: Self-pay | Admitting: Family Medicine

## 2020-06-09 DIAGNOSIS — M6281 Muscle weakness (generalized): Secondary | ICD-10-CM

## 2020-06-09 DIAGNOSIS — G8929 Other chronic pain: Secondary | ICD-10-CM

## 2020-06-09 DIAGNOSIS — R262 Difficulty in walking, not elsewhere classified: Secondary | ICD-10-CM

## 2020-06-09 NOTE — Therapy (Signed)
[place], [place], [address] Phone: [phone]   Fax:  [phone]  Physical Therapy Treatment  Patient Details  Name: Gail Cox MRN: [id number] Date of Birth: April 27, 1965 Referring Provider (PT): Marchia Bond MD   Encounter Date: 06/09/2020   PT End of Session - 06/09/20 1332    Visit Number 3    Number of Visits 8    Date for PT Re-Evaluation 07/08/20    Authorization Type [employer] Managed, no auth req no VL    Progress Note Due on Visit 10    PT Start Time 1320    PT Stop Time 1400    PT Time Calculation (min) 40 min    Activity Tolerance Patient tolerated treatment well    Behavior During Therapy [facility] for tasks assessed/performed           Past Medical History:  Diagnosis Date  . Allergic rhinitis, seasonal   . Asthma   . History of kidney stones   . Hypothyroidism   . Migraine headache     Past Surgical History:  Procedure Laterality Date  . ABDOMINAL HYSTERECTOMY     partial hysterectomy  . BREAST BIOPSY Left   . BREAST CYST EXCISION Left   . CHOLECYSTECTOMY  1999  . COLONOSCOPY  09/21/2011   Procedure: COLONOSCOPY;  Surgeon: Rogene Houston, MD;  [facility];  Service: Endoscopy;  Laterality: N/A;  830  . COLONOSCOPY N/A 11/11/2016   Procedure: COLONOSCOPY;  Surgeon: Rogene Houston, MD;  [facility];  Service: Endoscopy;  Laterality: N/A;  1155  . CYSTECTOMY  1977   Right arm  . CYSTECTOMY  2007   Left breast  . EXTRACORPOREAL SHOCK WAVE LITHOTRIPSY Left 03/29/2018   Procedure: EXTRACORPOREAL SHOCK WAVE LITHOTRIPSY (ESWL);  Surgeon: Raynelle Bring, MD;  [facility];  Service: Urology;  Laterality: Left;  . KNEE ARTHROSCOPY Bilateral    bilateral partial knee replacement  . OVARIAN CYST SURGERY  2003   Removed and uterus  . SPINE SURGERY  2016   cervical spine  . THYROIDECTOMY  2006    There were no vitals filed for this visit.   Subjective  Assessment - 06/09/20 1320    Subjective Pt states she has had a light day.    Pertinent History right partial knee TAK 12/2017    Patient Stated Goals to go up stairs without pain    Currently in Pain? No/denies    Pain Onset More than a month ago                  [facility] Adult PT Treatment/Exercise - 06/09/20 0001      Exercises   Exercises Knee/Hip      Knee/Hip Exercises: Stretches   Active Hamstring Stretch Right;3 reps;30 seconds    Quad Stretch Right;3 reps;30 seconds    Quad Stretch Limitations PROM      Knee/Hip Exercises: Standing   Heel Raises Both;15 reps    Knee Flexion Right;10 reps    Knee Flexion Limitations 21/2    Functional Squat 10 reps    SLS x4 reps; max of 10" on each    Other Standing Knee Exercises side step x 2 RT      Knee/Hip Exercises: Seated   Long Arc Quad Right;10 reps    Long Arc Quad Weight 3 lbs.      Knee/Hip Exercises: Supine   Bridges Both;10 reps  Straight Leg Raises Right;10 reps      Knee/Hip Exercises: Prone   Hamstring Curl 10 reps    Hamstring Curl Limitations 2.5    Hip Extension Strengthening;Both;10 reps      Manual Therapy   Manual Therapy Soft tissue mobilization    Manual therapy comments effluage, petrissage and trigger point pressure to reduce tightness of lateral soft tissue    Soft tissue mobilization to decrease pain                    PT Short Term Goals - 06/02/20 1408      PT SHORT TERM GOAL #1   Title Patient will report at least 25% improvement in overall symptoms and/or function to demonstrate improved functional mobility    Time 3    Period Weeks    Status On-going    Target Date 06/17/20      PT SHORT TERM GOAL #2   Title Patient will be independent in self management strategies to improve quality of life and functional outcomes.    Time 3    Period Weeks    Status On-going    Target Date 06/17/20      PT SHORT TERM GOAL #3   Title Patient will be able to demonstrate 2-125   in right knee to improve gross knee mobility    Time 3    Period Weeks    Status On-going    Target Date 06/17/20             PT Long Term Goals - 06/02/20 1409      PT LONG TERM GOAL #1   Title Patient will be able to ascend and escend stairs with reciprocal gait and use of one railling as needed with < 3/10 pain    Time 6    Period Weeks    Status On-going      PT LONG TERM GOAL #2   Title Patient will have no pain wiht resisted hamstring MMT    Time 6    Period Weeks    Status On-going      PT LONG TERM GOAL #3   Title Patient will report at least 50% improvement in overall symptoms and/or function to demonstrate improved functional mobility    Time 6    Period Weeks    Status On-going      PT LONG TERM GOAL #4   Status On-going                 Plan - 06/09/20 1333    Clinical Impression Statement Added side steps and wt to program.  Pt needs verbal cuing for proper technique of exercises but does well after this.  Pt is improving and progressing towards goals    Personal Factors and Comorbidities Comorbidity 1    Comorbidities hx right parital knee TKA    Examination-Activity Limitations Stairs;Locomotion Level;Lift;Stand    Examination-Participation Restrictions Occupation;Community Activity;Cleaning    Stability/Clinical Decision Making Stable/Uncomplicated    Rehab Potential Fair    PT Frequency --   1-2x/week for total of 8 visits over 6 weeks.   PT Duration 6 weeks    PT Treatment/Interventions ADLs/Self Care Home Management;Aquatic Therapy;Biofeedback;Moist Heat;Balance training;Therapeutic exercise;Therapeutic activities;Manual techniques;Electrical Stimulation;Cryotherapy;Patient/family education;Dry needling;Joint Manipulations    PT Next Visit Plan STM to right hamstring/glutes/ITB. knee ROM, hamstring/calf/hip strengthening    PT Home Exercise Plan step ups, lateral step ups    Consulted and Agree with Plan of Care Patient  Patient  will benefit from skilled therapeutic intervention in order to improve the following deficits and impairments:  Pain,Difficulty walking,Decreased range of motion,Decreased mobility,Decreased strength,Decreased activity tolerance,Decreased endurance  Visit Diagnosis: Muscle weakness (generalized)  Chronic pain of right knee  Difficulty in walking, not elsewhere classified     Problem List Patient Active Problem List   Diagnosis Date Noted  . Lipoma of right lower extremity 02/05/2020  . Encounter for screening fecal occult blood testing 02/05/2020  . Encounter for well woman exam with routine gynecological exam 02/05/2020  . Lab test positive for detection of COVID-19 virus 02/24/2019  . Abnormal EKG 11/08/2017  . Insomnia 09/20/2017  . Family history of colon cancer 10/10/2016  . IGT (impaired glucose tolerance) 08/10/2015  . Hyperlipidemia LDL goal <100 02/11/2014  . Essential hypertension 07/01/2013  . Osteoarthritis of both knees 09/20/2012  . Asthma in adult 02/27/2012  . Seasonal allergies 02/27/2012  . Overweight (BMI 25.0-29.9) 11/05/2011  . Hypothyroidism 06/06/2007  . Migraine with aura 06/06/2007  . ALLERGIC RHINITIS, SEASONAL 06/06/2007  Rayetta Humphrey, PT CLT [phone] 06/09/2020, 1:58 PM  [place] [address] [place], [place], [address] Phone: [phone]   Fax:  [phone]  Name: Nakeia Calvi MRN: [id number] Date of Birth: 07/12/1965

## 2020-06-16 ENCOUNTER — Encounter (HOSPITAL_COMMUNITY): Payer: Self-pay

## 2020-06-16 ENCOUNTER — Ambulatory Visit (HOSPITAL_COMMUNITY): Payer: Managed Care, Other (non HMO)

## 2020-06-16 ENCOUNTER — Other Ambulatory Visit: Payer: Self-pay

## 2020-06-16 DIAGNOSIS — M25561 Pain in right knee: Secondary | ICD-10-CM

## 2020-06-16 DIAGNOSIS — R262 Difficulty in walking, not elsewhere classified: Secondary | ICD-10-CM

## 2020-06-16 DIAGNOSIS — M6281 Muscle weakness (generalized): Secondary | ICD-10-CM | POA: Diagnosis not present

## 2020-06-16 DIAGNOSIS — G8929 Other chronic pain: Secondary | ICD-10-CM

## 2020-06-16 NOTE — Therapy (Signed)
[place], [place], [address] Phone: [phone]   Fax:  [phone]  Physical Therapy Treatment  Patient Details  Name: Gail Cox MRN: [id number] Date of Birth: May 12, 1965 Referring Provider (PT): Marchia Bond MD   Encounter Date: 06/16/2020   PT End of Session - 06/16/20 1303    Visit Number 4    Number of Visits 8    Date for PT Re-Evaluation 07/08/20    Authorization Type [employer] Managed, no auth req no VL    Progress Note Due on Visit 10    PT Start Time 1302    PT Stop Time 1345    PT Time Calculation (min) 43 min    Activity Tolerance Patient tolerated treatment well    Behavior During Therapy [facility] for tasks assessed/performed           Past Medical History:  Diagnosis Date  . Allergic rhinitis, seasonal   . Asthma   . History of kidney stones   . Hypothyroidism   . Migraine headache     Past Surgical History:  Procedure Laterality Date  . ABDOMINAL HYSTERECTOMY     partial hysterectomy  . BREAST BIOPSY Left   . BREAST CYST EXCISION Left   . CHOLECYSTECTOMY  1999  . COLONOSCOPY  09/21/2011   Procedure: COLONOSCOPY;  Surgeon: Rogene Houston, MD;  Location: [facility];  Service: Endoscopy;  Laterality: N/A;  830  . COLONOSCOPY N/A 11/11/2016   Procedure: COLONOSCOPY;  Surgeon: Rogene Houston, MD;  Location: [facility];  Service: Endoscopy;  Laterality: N/A;  1155  . CYSTECTOMY  1977   Right arm  . CYSTECTOMY  2007   Left breast  . EXTRACORPOREAL SHOCK WAVE LITHOTRIPSY Left 03/29/2018   Procedure: EXTRACORPOREAL SHOCK WAVE LITHOTRIPSY (ESWL);  Surgeon: Raynelle Bring, MD;  Location: [facility];  Service: Urology;  Laterality: Left;  . KNEE ARTHROSCOPY Bilateral    bilateral partial knee replacement  . OVARIAN CYST SURGERY  2003   Removed and uterus  . SPINE SURGERY  2016   cervical spine  . THYROIDECTOMY  2006    There were no vitals filed for this visit.   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 Yes    Pain Score 4     Pain Location Knee    Pain Orientation Right;Lateral    Pain Descriptors / Indicators Nagging    Pain Type Chronic pain    Pain Onset More than a month ago                             [facility] Adult PT Treatment/Exercise - 06/16/20 0001      Knee/Hip Exercises: Stretches   ITB Stretch Right;3 reps;30 seconds    Piriformis Stretch Right;3 reps;30 seconds      Knee/Hip Exercises: Standing   Knee Flexion Strengthening;Both;3 sets;10 reps    Knee Flexion Limitations 5      Knee/Hip Exercises: Seated   Long Arc Quad Strengthening;Both;3 sets;10 reps    Long Arc Quad Weight 5 lbs.  PT Education - 06/16/20 1335    Education Details education on IT band anatomy, benefits/use of ITB strap, HEP additions. Kinesiotape applied to right latreal ITB in inhibition style    Person(s) Educated Patient    Methods Explanation    Comprehension Verbalized understanding            PT Short Term Goals - 06/02/20 1408      PT SHORT TERM GOAL #1   Title Patient will report at least 25% improvement in overall symptoms and/or function to demonstrate improved functional mobility    Time 3    Period Weeks    Status On-going    Target Date 06/17/20      PT SHORT TERM GOAL #2   Title Patient will be independent in self management strategies to improve quality of life and functional outcomes.    Time 3    Period Weeks    Status On-going    Target Date 06/17/20      PT SHORT TERM GOAL #3   Title Patient will be able to demonstrate 2-125  in right knee to improve gross knee mobility    Time 3    Period Weeks    Status On-going    Target Date  06/17/20             PT Long Term Goals - 06/02/20 1409      PT LONG TERM GOAL #1   Title Patient will be able to ascend and escend stairs with reciprocal gait and use of one railling as needed with < 3/10 pain    Time 6    Period Weeks    Status On-going      PT LONG TERM GOAL #2   Title Patient will have no pain wiht resisted hamstring MMT    Time 6    Period Weeks    Status On-going      PT LONG TERM GOAL #3   Title Patient will report at least 50% improvement in overall symptoms and/or function to demonstrate improved functional mobility    Time 6    Period Weeks    Status On-going      PT LONG TERM GOAL #4   Status On-going                 Plan - 06/16/20 1341    Clinical Impression Statement Continues to report persistent right lateral knee pain and notes increased pain with increase number of 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  Discussion with pt regarding workplace ergonomics and methods to limit number of repetitions but this would require work vehicle modification and she does not feel that this request could be met.  Continued POC indicate dto progress strength and HEP development to improve hip abductor and hamstring strength    Personal Factors and Comorbidities Comorbidity 1    Comorbidities hx right parital knee TKA    Examination-Activity Limitations Stairs;Locomotion Level;Lift;Stand    Examination-Participation Restrictions Occupation;Community Activity;Cleaning    Stability/Clinical Decision Making Stable/Uncomplicated    Rehab Potential Fair    PT Frequency --   1-2x/week for total of 8 visits over 6 weeks.   PT Duration 6 weeks    PT Treatment/Interventions ADLs/Self Care Home Management;Aquatic Therapy;Biofeedback;Moist Heat;Balance training;Therapeutic exercise;Therapeutic activities;Manual techniques;Electrical Stimulation;Cryotherapy;Patient/family education;Dry needling;Joint  Manipulations    PT Next Visit Plan STM to right hamstring/glutes/ITB. knee ROM, hamstring/calf/hip strengthening    PT Home Exercise  Plan step ups, lateral step ups    Consulted and Agree with Plan of Care Patient           Patient will benefit from skilled therapeutic intervention in order to improve the following deficits and impairments:  Pain,Difficulty walking,Decreased range of motion,Decreased mobility,Decreased strength,Decreased activity tolerance,Decreased endurance  Visit Diagnosis: Muscle weakness (generalized)  Chronic pain of right knee  Difficulty in walking, not elsewhere classified     Problem List Patient Active Problem List   Diagnosis Date Noted  . Lipoma of right lower extremity 02/05/2020  . Encounter for screening fecal occult blood testing 02/05/2020  . Encounter for well woman exam with routine gynecological exam 02/05/2020  . Lab test positive for detection of COVID-19 virus 02/24/2019  . Abnormal EKG 11/08/2017  . Insomnia 09/20/2017  . Family history of colon cancer 10/10/2016  . IGT (impaired glucose tolerance) 08/10/2015  . Hyperlipidemia LDL goal <100 02/11/2014  . Essential hypertension 07/01/2013  . Osteoarthritis of both knees 09/20/2012  . Asthma in adult 02/27/2012  . Seasonal allergies 02/27/2012  . Overweight (BMI 25.0-29.9) 11/05/2011  . Hypothyroidism 06/06/2007  . Migraine with aura 06/06/2007  . ALLERGIC RHINITIS, SEASONAL 06/06/2007   1:43 PM, 06/16/20 M. Sherlyn Lees, PT, DPT Physical Therapist- [place] Office Number: [phone]  [place] [address] [place], [place], [address] Phone: [phone]   Fax:  [phone]  Name: Shine Scrogham MRN: [id number] Date of Birth: [birth year]

## 2020-06-16 NOTE — Patient Instructions (Signed)
Access Code: [email or web address] URL: [email or web address] Date: 06/16/2020 Prepared by: Sherlyn Lees  Exercises ITB Stretch at [place] - 1 x daily - 7 x weekly - 3 sets - 10 reps Standing ITB Stretch - 1 x daily - 7 x weekly - 3 sets - 10 reps Seated Piriformis Stretch - 1 x daily - 7 x weekly - 3 sets - 10 reps Seated Long Arc Quad with Ankle Weight - 1 x daily - 7 x weekly - 3 sets - 10 reps Standing Alternating Knee Flexion with Ankle Weights - 1 x daily - 7 x weekly - 3 sets - 10 reps

## 2020-06-23 ENCOUNTER — Ambulatory Visit (HOSPITAL_COMMUNITY): Payer: Managed Care, Other (non HMO) | Admitting: Physical Therapy

## 2020-06-30 ENCOUNTER — Ambulatory Visit (HOSPITAL_COMMUNITY): Payer: Managed Care, Other (non HMO) | Admitting: Physical Therapy

## 2020-06-30 ENCOUNTER — Other Ambulatory Visit: Payer: Self-pay

## 2020-06-30 ENCOUNTER — Telehealth: Payer: Self-pay

## 2020-06-30 NOTE — Telephone Encounter (Signed)
Spoke with pt who was very angry on the phone, she demanded that I stay on the phone with her while she did something because she had to wait until I called her, so therefore I needed to wait on the phone until she was able to speak. I waited for about 30 seconds and apologized to her that this was a day after a holiday and we were getting to all of our messages as quickly as possible. She kept cutting me off. She said she was currently at work but wanted a stat ultrasound ordered now. She then hung up on me. I have called the [facility] and left a message. There is nothing in the chart that I see that pt needs anything ordered. She is not due until 8/22 to have a mammogram [facility]. Will await a phone call back from the [facility], I have called them twice.

## 2020-06-30 NOTE — Telephone Encounter (Signed)
Pt is calling to get a stat Ultrasound of the Breast area

## 2020-07-01 NOTE — Telephone Encounter (Signed)
Patient called back and ask to speak to Dr Moshe Cipro only. Ask to have Dr Moshe Cipro please give her a call.

## 2020-07-01 NOTE — Telephone Encounter (Signed)
I spoke with pt, states she has been told she needs a stat diag mammogram of left breast, c/o 3 different calls and still not done she has been talking to breast center States she was told to go to [facility] by staff her and was told to [facility] states she nearly cried

## 2020-07-01 NOTE — Telephone Encounter (Signed)
Noted  

## 2020-07-02 ENCOUNTER — Other Ambulatory Visit: Payer: Self-pay | Admitting: Family Medicine

## 2020-07-02 DIAGNOSIS — N644 Mastodynia: Secondary | ICD-10-CM

## 2020-07-02 NOTE — Telephone Encounter (Signed)
Orders enered, mammogram as stat and [place] routine since they may not need it, thanks

## 2020-07-06 ENCOUNTER — Ambulatory Visit: Payer: Managed Care, Other (non HMO) | Admitting: Family Medicine

## 2020-07-07 ENCOUNTER — Encounter (HOSPITAL_COMMUNITY): Payer: Managed Care, Other (non HMO) | Admitting: Physical Therapy

## 2020-07-20 ENCOUNTER — Other Ambulatory Visit: Payer: Self-pay

## 2020-07-20 ENCOUNTER — Other Ambulatory Visit: Payer: Self-pay | Admitting: Family Medicine

## 2020-07-20 DIAGNOSIS — E559 Vitamin D deficiency, unspecified: Secondary | ICD-10-CM

## 2020-07-20 DIAGNOSIS — E785 Hyperlipidemia, unspecified: Secondary | ICD-10-CM

## 2020-07-20 DIAGNOSIS — E038 Other specified hypothyroidism: Secondary | ICD-10-CM

## 2020-07-20 DIAGNOSIS — I10 Essential (primary) hypertension: Secondary | ICD-10-CM

## 2020-07-20 DIAGNOSIS — R7302 Impaired glucose tolerance (oral): Secondary | ICD-10-CM

## 2020-07-21 ENCOUNTER — Ambulatory Visit
Admission: RE | Admit: 2020-07-21 | Discharge: 2020-07-21 | Disposition: A | Discharge status: Home / Self Care | Payer: Managed Care, Other (non HMO) | Source: Ambulatory Visit | Attending: Family Medicine | Admitting: Family Medicine

## 2020-07-21 ENCOUNTER — Other Ambulatory Visit: Payer: Self-pay

## 2020-07-21 DIAGNOSIS — N644 Mastodynia: Secondary | ICD-10-CM

## 2020-08-27 ENCOUNTER — Other Ambulatory Visit: Payer: Self-pay | Admitting: Family Medicine

## 2020-09-22 LAB — CMP14+EGFR
ALT: 14 IU/L (ref 0–32)
AST: 18 IU/L (ref 0–40)
Albumin/Globulin Ratio: 2.1 (ref 1.2–2.2)
Albumin: 4.4 g/dL (ref 3.8–4.9)
Alkaline Phosphatase: 90 IU/L (ref 44–121)
BUN/Creatinine Ratio: 12 (ref 9–23)
BUN: 9 mg/dL (ref 6–24)
Bilirubin Total: 0.6 mg/dL (ref 0.0–1.2)
CO2: 20 mmol/L (ref 20–29)
Calcium: 9.7 mg/dL (ref 8.7–10.2)
Chloride: 105 mmol/L (ref 96–106)
Creatinine, Ser: 0.76 mg/dL (ref 0.57–1.00)
Globulin, Total: 2.1 g/dL (ref 1.5–4.5)
Glucose: 103 mg/dL — ABNORMAL HIGH (ref 65–99)
Potassium: 4 mmol/L (ref 3.5–5.2)
Sodium: 141 mmol/L (ref 134–144)
Total Protein: 6.5 g/dL (ref 6.0–8.5)
eGFR: 91 mL/min/{1.73_m2} (ref >59)

## 2020-09-22 LAB — VITAMIN D 25 HYDROXY (VIT D DEFICIENCY, FRACTURES): Vit D, 25-Hydroxy: 27.6 ng/mL — ABNORMAL LOW (ref 30.0–100.0)

## 2020-09-22 LAB — LIPID PANEL
Chol/HDL Ratio: 3 ratio (ref 0.0–4.4)
Cholesterol, Total: 203 mg/dL — ABNORMAL HIGH (ref 100–199)
HDL: 68 mg/dL (ref >39)
LDL Chol Calc (NIH): 122 mg/dL — ABNORMAL HIGH (ref 0–99)
Triglycerides: 75 mg/dL (ref 0–149)
VLDL Cholesterol Cal: 13 mg/dL (ref 5–40)

## 2020-09-22 LAB — TSH: TSH: 3.32 u[IU]/mL (ref 0.450–4.500)

## 2020-09-25 ENCOUNTER — Other Ambulatory Visit: Payer: Self-pay | Admitting: Family Medicine

## 2020-09-25 DIAGNOSIS — Z1231 Encounter for screening mammogram for malignant neoplasm of breast: Secondary | ICD-10-CM

## 2020-09-25 DIAGNOSIS — I10 Essential (primary) hypertension: Secondary | ICD-10-CM

## 2020-09-25 NOTE — Progress Notes (Signed)
Lab added on

## 2020-09-26 LAB — SPECIMEN STATUS REPORT

## 2020-09-27 ENCOUNTER — Other Ambulatory Visit: Payer: Self-pay | Admitting: Family Medicine

## 2020-09-27 DIAGNOSIS — I10 Essential (primary) hypertension: Secondary | ICD-10-CM

## 2020-10-25 ENCOUNTER — Other Ambulatory Visit: Payer: Self-pay | Admitting: Family Medicine

## 2020-10-29 ENCOUNTER — Ambulatory Visit (INDEPENDENT_AMBULATORY_CARE_PROVIDER_SITE_OTHER): Payer: Managed Care, Other (non HMO) | Admitting: Family Medicine

## 2020-10-29 ENCOUNTER — Other Ambulatory Visit: Payer: Self-pay

## 2020-10-29 ENCOUNTER — Encounter (INDEPENDENT_AMBULATORY_CARE_PROVIDER_SITE_OTHER): Payer: Self-pay

## 2020-10-29 ENCOUNTER — Encounter: Payer: Self-pay | Admitting: Family Medicine

## 2020-10-29 ENCOUNTER — Ambulatory Visit
Admission: RE | Admit: 2020-10-29 | Discharge: 2020-10-29 | Disposition: A | Discharge status: Home / Self Care | Payer: Managed Care, Other (non HMO) | Source: Ambulatory Visit | Attending: Family Medicine | Admitting: Family Medicine

## 2020-10-29 VITALS — BP 123/77 | HR 83 | Resp 20 | Ht 65.0 in | Wt 173.0 lb

## 2020-10-29 DIAGNOSIS — R7302 Impaired glucose tolerance (oral): Secondary | ICD-10-CM | POA: Diagnosis not present

## 2020-10-29 DIAGNOSIS — I10 Essential (primary) hypertension: Secondary | ICD-10-CM | POA: Diagnosis not present

## 2020-10-29 DIAGNOSIS — E785 Hyperlipidemia, unspecified: Secondary | ICD-10-CM

## 2020-10-29 DIAGNOSIS — Z1211 Encounter for screening for malignant neoplasm of colon: Secondary | ICD-10-CM | POA: Diagnosis not present

## 2020-10-29 DIAGNOSIS — E038 Other specified hypothyroidism: Secondary | ICD-10-CM

## 2020-10-29 DIAGNOSIS — Z23 Encounter for immunization: Secondary | ICD-10-CM

## 2020-10-29 DIAGNOSIS — G4709 Other insomnia: Secondary | ICD-10-CM

## 2020-10-29 DIAGNOSIS — E663 Overweight: Secondary | ICD-10-CM

## 2020-10-29 DIAGNOSIS — Z1231 Encounter for screening mammogram for malignant neoplasm of breast: Secondary | ICD-10-CM

## 2020-10-29 NOTE — Patient Instructions (Addendum)
F/U in early March, call if you need me before  Flu vaccine and shingrix #2 today  Please get your covid booster  as soon possible  Please order cologuard test, 03/02/2021   Fasting lipid, cmp and eGFr, hBA1C , tSH,CBC. 5 days before follow up  Start OTC vit D3 2000 IU every day  It is important that you exercise regularly at least 30 minutes 5 times a week. If you develop chest pain, have severe difficulty breathing, or feel very tired, stop exercising immediately and seek medical attention    Thanks for choosing [facility], we consider it a privelige to serve you.       Work on [employer] choice more and continue to keep active

## 2020-11-01 ENCOUNTER — Encounter: Payer: Self-pay | Admitting: Family Medicine

## 2020-11-01 NOTE — Assessment & Plan Note (Signed)
  Patient re-educated about  the importance of commitment to a  minimum of 150 minutes of exercise per week as able.  The importance of healthy food choices with portion control discussed, as well as eating regularly and within a 12 hour window most days. The need to choose "clean , green" food 50 to 75% of the time is discussed, as well as to make water the primary drink and set a goal of 64 ounces water daily.    Weight /BMI 10/29/2020 02/05/2020 09/18/2019  WEIGHT 173 lb 172 lb 166 lb 1.3 oz  HEIGHT 5\' 5"  5' 4.25" 5\' 5"   BMI 28.79 kg/m2 29.29 kg/m2 27.64 kg/m2

## 2020-11-01 NOTE — Assessment & Plan Note (Signed)
Controlled, no change in medication Updated lab needed at/ before next visit.  

## 2020-11-01 NOTE — Assessment & Plan Note (Signed)
Controlled, no change in medication DASH diet and commitment to daily physical activity for a minimum of 30 minutes discussed and encouraged, as a part of hypertension management. The importance of attaining a healthy weight is also discussed.  BP/Weight 10/29/2020 02/05/2020 09/18/2019 03/05/2019 02/27/2019 02/21/2019 5/83/4621  Systolic BP 947 125 271 292 909 030 149  Diastolic BP 77 85 80 82 83 70 82  Wt. (Lbs) 173 172 166.08 170 - 170 174  BMI 28.79 29.29 27.64 28.29 - 28.29 28.96

## 2020-11-01 NOTE — Progress Notes (Signed)
Gail Cox     MRN: [id number]      DOB: [birth year]   HPI Gail Cox is here for follow up and re-evaluation of chronic medical conditions, medication management and review of any available recent lab and radiology data.  Preventive health is updated, specifically  Cancer screening and Immunization.   Questions or concerns regarding consultations or procedures which the PT has had in the interim are  addressed. The PT denies any adverse reactions to current medications since the last visit.  There are no new concerns.  There are no specific complaints   ROS Denies recent fever or chills. Denies sinus pressure, nasal congestion, ear pain or sore throat. Denies chest congestion, productive cough or wheezing. Denies chest pains, palpitations and leg swelling Denies abdominal pain, nausea, vomiting,diarrhea or constipation.   Denies dysuria, frequency, hesitancy or incontinence. Denies joint pain, swelling and limitation in mobility. Denies headaches, seizures, numbness, or tingling. Denies depression, anxiety or insomnia. Denies skin break down or rash.   PE  BP 123/77   Pulse 83   Resp 20   Ht 5\' 5"  (1.651 m)   Wt 173 lb (78.5 kg)   SpO2 96%   BMI 28.79 kg/m   Patient alert and oriented and in no cardiopulmonary distress.  HEENT: No facial asymmetry, EOMI,     Neck supple .  Chest: Clear to auscultation bilaterally.  CVS: S1, S2 no murmurs, no S3.Regular rate.  ABD: Soft non tender.   Ext: No edema  MS: Adequate ROM spine, shoulders, hips and knees.  Skin: Intact, no ulcerations or rash noted.  Psych: Good eye contact, normal affect. Memory intact not anxious or depressed appearing.  CNS: CN 2-12 intact, power,  normal throughout.no focal deficits noted.   Assessment & Plan  Essential hypertension Controlled, no change in medication DASH diet and commitment to daily physical activity for a minimum of 30 minutes discussed and encouraged, as a part  of hypertension management. The importance of attaining a healthy weight is also discussed.  BP/Weight 10/29/2020 02/05/2020 09/18/2019 03/05/2019 02/27/2019 02/21/2019 3/71/6967  Systolic BP 893 810 175 102 585 277 824  Diastolic BP 77 85 80 82 83 70 82  Wt. (Lbs) 173 172 166.08 170 - 170 174  BMI 28.79 29.29 27.64 28.29 - 28.29 28.96       Overweight (BMI 25.0-29.9)  Patient re-educated about  the importance of commitment to a  minimum of 150 minutes of exercise per week as able.  The importance of healthy food choices with portion control discussed, as well as eating regularly and within a 12 hour window most days. The need to choose "clean , green" food 50 to 75% of the time is discussed, as well as to make water the primary drink and set a goal of 64 ounces water daily.    Weight /BMI 10/29/2020 02/05/2020 09/18/2019  WEIGHT 173 lb 172 lb 166 lb 1.3 oz  HEIGHT 5\' 5"  5' 4.25" 5\' 5"   BMI 28.79 kg/m2 29.29 kg/m2 27.64 kg/m2      Insomnia Sleep hygiene reviewed and written information offered also. Prescription sent for  medication needed.   Hyperlipidemia LDL goal <100 Hyperlipidemia:Low fat diet discussed and encouraged.   Lipid Panel  Lab Results  Component Value Date   CHOL 203 (H) 09/21/2020   HDL 68 09/21/2020   LDLCALC 122 (H) 09/21/2020   TRIG 75 09/21/2020   CHOLHDL 3.0 09/21/2020  Updated lab needed at/ before next visit.  Hypothyroidism Controlled, no change in medication Updated lab needed at/ before next visit.

## 2020-11-01 NOTE — Assessment & Plan Note (Signed)
Hyperlipidemia:Low fat diet discussed and encouraged.   Lipid Panel  Lab Results  Component Value Date   CHOL 203 (H) 09/21/2020   HDL 68 09/21/2020   LDLCALC 122 (H) 09/21/2020   TRIG 75 09/21/2020   CHOLHDL 3.0 09/21/2020  Updated lab needed at/ before next visit.

## 2020-11-01 NOTE — Assessment & Plan Note (Signed)
Sleep hygiene reviewed and written information offered also. Prescription sent for  medication needed.  

## 2020-11-24 ENCOUNTER — Other Ambulatory Visit: Payer: Self-pay | Admitting: Family Medicine

## 2020-12-09 LAB — COLOGUARD

## 2020-12-25 ENCOUNTER — Encounter (HOSPITAL_COMMUNITY): Payer: Self-pay | Admitting: Emergency Medicine

## 2020-12-25 ENCOUNTER — Emergency Department (HOSPITAL_COMMUNITY): Payer: No Typology Code available for payment source

## 2020-12-25 ENCOUNTER — Other Ambulatory Visit: Payer: Self-pay

## 2020-12-25 ENCOUNTER — Emergency Department (HOSPITAL_COMMUNITY)
Admission: EM | Admit: 2020-12-25 | Discharge: 2020-12-25 | Disposition: A | Discharge status: Home / Self Care | Payer: No Typology Code available for payment source | Attending: Emergency Medicine | Admitting: Emergency Medicine

## 2020-12-25 DIAGNOSIS — Z79899 Other long term (current) drug therapy: Secondary | ICD-10-CM | POA: Insufficient documentation

## 2020-12-25 DIAGNOSIS — Z96652 Presence of left artificial knee joint: Secondary | ICD-10-CM | POA: Insufficient documentation

## 2020-12-25 DIAGNOSIS — S199XXA Unspecified injury of neck, initial encounter: Secondary | ICD-10-CM | POA: Diagnosis not present

## 2020-12-25 DIAGNOSIS — I10 Essential (primary) hypertension: Secondary | ICD-10-CM | POA: Insufficient documentation

## 2020-12-25 DIAGNOSIS — Y92410 Unspecified street and highway as the place of occurrence of the external cause: Secondary | ICD-10-CM | POA: Diagnosis not present

## 2020-12-25 DIAGNOSIS — E039 Hypothyroidism, unspecified: Secondary | ICD-10-CM | POA: Insufficient documentation

## 2020-12-25 DIAGNOSIS — J45909 Unspecified asthma, uncomplicated: Secondary | ICD-10-CM | POA: Diagnosis not present

## 2020-12-25 DIAGNOSIS — S0990XA Unspecified injury of head, initial encounter: Secondary | ICD-10-CM | POA: Insufficient documentation
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
## 2020-12-26 LAB — COLOGUARD: COLOGUARD: NEGATIVE

## 2020-12-28 ENCOUNTER — Telehealth: Payer: Self-pay
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
## 2021-01-22 ENCOUNTER — Other Ambulatory Visit: Payer: Self-pay | Admitting: Family Medicine

## 2021-02-05 ENCOUNTER — Other Ambulatory Visit: Payer: Managed Care, Other (non HMO) | Admitting: Obstetrics & Gynecology

## 2021-02-21 ENCOUNTER — Other Ambulatory Visit: Payer: Self-pay | Admitting: Family Medicine

## 2021-05-19 ENCOUNTER — Telehealth: Payer: Self-pay

## 2021-05-19 NOTE — Telephone Encounter (Signed)
Patient came by the office asked if Dr Moshe Cipro will order her blood work, she will be by Monday morning.  ?

## 2021-05-20 ENCOUNTER — Other Ambulatory Visit: Payer: Self-pay | Admitting: Family Medicine

## 2021-05-20 DIAGNOSIS — R7302 Impaired glucose tolerance (oral): Secondary | ICD-10-CM

## 2021-05-20 DIAGNOSIS — I10 Essential (primary) hypertension: Secondary | ICD-10-CM

## 2021-05-20 DIAGNOSIS — E785 Hyperlipidemia, unspecified: Secondary | ICD-10-CM

## 2021-05-20 NOTE — Telephone Encounter (Signed)
Labs ordered.

## 2021-05-25 ENCOUNTER — Other Ambulatory Visit: Payer: Managed Care, Other (non HMO) | Admitting: Adult Health

## 2021-05-25 LAB — CBC
Hematocrit: 43 % (ref 34.0–46.6)
Hemoglobin: 13.9 g/dL (ref 11.1–15.9)
MCH: 27.6 pg (ref 26.6–33.0)
MCHC: 32.3 g/dL (ref 31.5–35.7)
MCV: 86 fL (ref 79–97)
Platelets: 309 10*3/uL (ref 150–450)
RBC: 5.03 x10E6/uL (ref 3.77–5.28)
RDW: 12.9 % (ref 11.7–15.4)
WBC: 4.3 10*3/uL (ref 3.4–10.8)

## 2021-05-25 LAB — CMP14+EGFR
ALT: 12 IU/L (ref 0–32)
AST: 17 IU/L (ref 0–40)
Albumin/Globulin Ratio: 2.4 — ABNORMAL HIGH (ref 1.2–2.2)
Albumin: 4.5 g/dL (ref 3.8–4.9)
Alkaline Phosphatase: 81 IU/L (ref 44–121)
BUN/Creatinine Ratio: 13 (ref 9–23)
BUN: 10 mg/dL (ref 6–24)
Bilirubin Total: 0.7 mg/dL (ref 0.0–1.2)
CO2: 22 mmol/L (ref 20–29)
Calcium: 9.6 mg/dL (ref 8.7–10.2)
Chloride: 104 mmol/L (ref 96–106)
Creatinine, Ser: 0.77 mg/dL (ref 0.57–1.00)
Globulin, Total: 1.9 g/dL (ref 1.5–4.5)
Glucose: 95 mg/dL (ref 70–99)
Potassium: 4.3 mmol/L (ref 3.5–5.2)
Sodium: 145 mmol/L — ABNORMAL HIGH (ref 134–144)
Total Protein: 6.4 g/dL (ref 6.0–8.5)
eGFR: 90 mL/min/{1.73_m2} (ref >59)

## 2021-05-25 LAB — TSH: TSH: 2.9 u[IU]/mL (ref 0.450–4.500)

## 2021-05-25 LAB — LIPID PANEL
Chol/HDL Ratio: 2.7 ratio (ref 0.0–4.4)
Cholesterol, Total: 201 mg/dL — ABNORMAL HIGH (ref 100–199)
HDL: 74 mg/dL (ref >39)
LDL Chol Calc (NIH): 115 mg/dL — ABNORMAL HIGH (ref 0–99)
Triglycerides: 67 mg/dL (ref 0–149)
VLDL Cholesterol Cal: 12 mg/dL (ref 5–40)

## 2021-05-25 LAB — HEMOGLOBIN A1C
Est. average glucose Bld gHb Est-mCnc: 120 mg/dL
Hgb A1c MFr Bld: 5.8 % — ABNORMAL HIGH (ref 4.8–5.6)

## 2021-05-27 ENCOUNTER — Encounter: Payer: Self-pay | Admitting: Family Medicine

## 2021-05-27 ENCOUNTER — Ambulatory Visit (INDEPENDENT_AMBULATORY_CARE_PROVIDER_SITE_OTHER): Payer: Managed Care, Other (non HMO) | Admitting: Family Medicine

## 2021-05-27 VITALS — BP 138/83 | HR 75 | Resp 16 | Ht 65.0 in | Wt 174.8 lb

## 2021-05-27 DIAGNOSIS — Z800 Family history of malignant neoplasm of digestive organs: Secondary | ICD-10-CM | POA: Diagnosis not present

## 2021-05-27 DIAGNOSIS — N6019 Diffuse cystic mastopathy of unspecified breast: Secondary | ICD-10-CM | POA: Diagnosis not present

## 2021-05-27 DIAGNOSIS — F322 Major depressive disorder, single episode, severe without psychotic features: Secondary | ICD-10-CM | POA: Insufficient documentation

## 2021-05-27 DIAGNOSIS — E663 Overweight: Secondary | ICD-10-CM

## 2021-05-27 DIAGNOSIS — E785 Hyperlipidemia, unspecified: Secondary | ICD-10-CM

## 2021-05-27 DIAGNOSIS — I10 Essential (primary) hypertension: Secondary | ICD-10-CM | POA: Diagnosis not present

## 2021-05-27 DIAGNOSIS — E038 Other specified hypothyroidism: Secondary | ICD-10-CM

## 2021-05-27 DIAGNOSIS — R7302 Impaired glucose tolerance (oral): Secondary | ICD-10-CM

## 2021-05-27 MED ORDER — AMLODIPINE BESYLATE 5 MG PO TABS: 5.0000 mg | ORAL_TABLET | Freq: Every day | ORAL | 3 refills | Status: DC

## 2021-05-27 MED ORDER — VENLAFAXINE HCL ER 37.5 MG PO CP24: 37.5000 mg | ORAL_CAPSULE | Freq: Every day | ORAL | 3 refills | Status: DC

## 2021-05-27 NOTE — Assessment & Plan Note (Signed)
?  Patient re-educated about  the importance of commitment to a  minimum of 150 minutes of exercise per week as able. ? ?The importance of healthy food choices with portion control discussed, as well as eating regularly and within a 12 hour window most days. ?The need to choose "clean , green" food 50 to 75% of the time is discussed, as well as to make water the primary drink and set a goal of 64 ounces water daily. ? ?  ? ?  05/27/2021  ? 10:43 AM 12/25/2020  ? 10:01 AM 10/29/2020  ?  1:44 PM  ?Weight /BMI  ?Weight 174 lb 12.8 oz 175 lb 173 lb  ?Height '5\' 5"'$  (1.651 m) '5\' 5"'$  (1.651 m) '5\' 5"'$  (1.651 m)  ?BMI 29.09 kg/m2 29.12 kg/m2 28.79 kg/m2  ? ? ? ?
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
## 2021-05-27 NOTE — Assessment & Plan Note (Signed)
Controlled, no change in medication  

## 2021-05-27 NOTE — Assessment & Plan Note (Signed)
Staret effexor daily, therapy recommended, pt holding off now, will re assess in 8 weeks ?

## 2021-05-27 NOTE — Assessment & Plan Note (Signed)
Controlled, no change in medication ?DASH diet and commitment to daily physical activity for a minimum of 30 minutes discussed and encouraged, as a part of hypertension management. ?The importance of attaining a healthy weight is also discussed. ? ? ?  05/27/2021  ? 10:43 AM 12/25/2020  ?  1:30 PM 12/25/2020  ?  1:00 PM 12/25/2020  ? 11:30 AM 12/25/2020  ? 11:00 AM 12/25/2020  ? 10:05 AM 12/25/2020  ? 10:01 AM  ?BP/Weight  ?Systolic BP 461 901 222 411 131 145   ?Diastolic BP 83 76 76 82 72 87   ?Wt. (Lbs) 174.8      175  ?BMI 29.09 kg/m2      29.12 kg/m2  ? ? ? ? ?

## 2021-05-27 NOTE — Assessment & Plan Note (Signed)
Hyperlipidemia:Low fat diet discussed and encouraged. ? ? ?Lipid Panel  ?Lab Results  ?Component Value Date  ? CHOL 201 (H) 05/24/2021  ? HDL 74 05/24/2021  ? LDLCALC 115 (H) 05/24/2021  ? TRIG 67 05/24/2021  ? CHOLHDL 2.7 05/24/2021  ?Needs to reduce fried and fatty foods. ? ? ? ?

## 2021-05-27 NOTE — Patient Instructions (Signed)
F/u in 8 to 10 weeks, re evaluate depression, call if you need me sooner ? ?New medication effexor once daily with breakfast for depression and I want you to re consider therapy as well ? ?I am referring you to Oncology for consultation ? ?Continue to eat a lot of vegetables and fruit ? ?Thanks for choosing [facility], we consider it a privelige to serve you. ? ?

## 2021-05-27 NOTE — Assessment & Plan Note (Signed)
5 of her father's 8 sibs had colon ca ?

## 2021-05-27 NOTE — Assessment & Plan Note (Signed)
Patient educated about the importance of limiting  Carbohydrate intake , the need to commit to daily physical activity for a minimum of 30 minutes , and to commit weight loss. ?The fact that changes in all these areas will reduce or eliminate all together the development of diabetes is stressed. ?Deteriorated, needs to reduce sweets and carbs  ? ? ?  Latest Ref Rng & Units 05/24/2021  ?  8:13 AM 09/21/2020  ? 10:07 AM 09/17/2019  ?  8:47 AM 03/13/2019  ?  8:04 AM 05/28/2018  ?  8:27 AM  ?Diabetic Labs  ?HbA1c 4.8 - 5.6 % 5.8     5.6   5.6    ?Chol 100 - 199 mg/dL 201   203   232   219   205    ?HDL >39 mg/dL 74   68   75   82   64    ?Calc LDL 0 - 99 mg/dL 115   122   140   122   123    ?Triglycerides 0 - 149 mg/dL 67   75   71   55   85    ?Creatinine 0.57 - 1.00 mg/dL 0.77   0.76   0.75   0.79   0.77    ? ? ?  05/27/2021  ? 10:43 AM 12/25/2020  ?  1:30 PM 12/25/2020  ?  1:00 PM 12/25/2020  ? 11:30 AM 12/25/2020  ? 11:00 AM 12/25/2020  ? 10:05 AM 12/25/2020  ? 10:01 AM  ?BP/Weight  ?Systolic BP 976 734 193 790 131 145   ?Diastolic BP 83 76 76 82 72 87   ?Wt. (Lbs) 174.8      175  ?BMI 29.09 kg/m2      29.12 kg/m2  ? ?   ? View : No data to display.  ?  ?  ?  ? ? ? ?

## 2021-05-27 NOTE — Assessment & Plan Note (Signed)
Recurrent left breast cysts needing biopsy very strong f/h colon  Cancer, pt has quesrions as to best management option and Oncology consultation is being set up, wondering if she should have a mastectomy, or remove her single ovary. One family member known to have had breast cancer ?

## 2021-09-20 ENCOUNTER — Encounter: Payer: Self-pay | Admitting: Adult Health

## 2021-09-20 ENCOUNTER — Inpatient Hospital Stay: Payer: Managed Care, Other (non HMO) | Attending: Hematology | Admitting: Hematology

## 2021-09-20 ENCOUNTER — Ambulatory Visit (INDEPENDENT_AMBULATORY_CARE_PROVIDER_SITE_OTHER): Payer: Managed Care, Other (non HMO) | Admitting: Adult Health

## 2021-09-20 VITALS — BP 113/67 | HR 67 | Ht 65.0 in | Wt 172.5 lb

## 2021-09-20 DIAGNOSIS — Z800 Family history of malignant neoplasm of digestive organs: Secondary | ICD-10-CM | POA: Diagnosis not present

## 2021-09-20 DIAGNOSIS — D1723 Benign lipomatous neoplasm of skin and subcutaneous tissue of right leg: Secondary | ICD-10-CM

## 2021-09-20 DIAGNOSIS — Z01419 Encounter for gynecological examination (general) (routine) without abnormal findings: Secondary | ICD-10-CM

## 2021-09-20 DIAGNOSIS — Z1211 Encounter for screening for malignant neoplasm of colon: Secondary | ICD-10-CM | POA: Diagnosis not present

## 2021-09-20 DIAGNOSIS — D242 Benign neoplasm of left breast: Secondary | ICD-10-CM | POA: Diagnosis present

## 2021-09-20 DIAGNOSIS — N644 Mastodynia: Secondary | ICD-10-CM | POA: Insufficient documentation

## 2021-09-20 DIAGNOSIS — Z803 Family history of malignant neoplasm of breast: Secondary | ICD-10-CM | POA: Diagnosis not present

## 2021-09-20 DIAGNOSIS — Z801 Family history of malignant neoplasm of trachea, bronchus and lung: Secondary | ICD-10-CM | POA: Diagnosis not present

## 2021-09-20 DIAGNOSIS — Z809 Family history of malignant neoplasm, unspecified: Secondary | ICD-10-CM

## 2021-09-20 LAB — HEMOCCULT GUIAC POC 1CARD (OFFICE): Fecal Occult Blood, POC: NEGATIVE

## 2021-09-20 NOTE — Patient Instructions (Addendum)
[place] at [facility] Discharge Instructions  You were seen and examined today by Dr. Delton Coombes. Dr. Delton Coombes is a medical oncologist, meaning that he specializes in the treatment of cancer diagnoses. Dr. Delton Coombes discussed your past medical history, family history of cancers, and the events that led to you being here today.  You were referred to Dr. Delton Coombes due to repeated abnormal mammograms. None of your previous biopsies have been related to cancer, they were fibroadenomas. These are a benign finding within the breast and there is no increased risk for developing breast cancer associated with these findings.  Fibroadenomas often develop due to high estrogen development, once you are through menopause they will likely decrease in development. There is no way to prevent them from occurring at this point. This does not warrant a mastectomy.  Dr. Delton Coombes has discussed the option of Galleri testing. This is a blood test that screens for numerous types of cancer. It is not covered by insurance and the cost of the test is approximately $1,000. Dr. Delton Coombes can order this test should you choose to proceed with it.  Dr. Delton Coombes has recommended continued mammograms as you have been and follow-up with Dr. Moshe Cipro accordingly.  Thank you for choosing [place] at [facility] to provide your oncology and hematology care.  To afford each patient quality time with our provider, please arrive at least 15 minutes before your scheduled appointment time.   If you have a lab appointment with the [facility] please come in thru the Main Entrance and check in at the main information desk.  You need to re-schedule your appointment should you arrive 10 or more minutes late.  We strive to give you quality time with our providers, and arriving late affects you and other patients whose appointments are after yours.  Also, if you no show three or more  times for appointments you may be dismissed from the clinic at the providers discretion.     Again, thank you for choosing [facility].  Our hope is that these requests will decrease the amount of time that you wait before being seen by our physicians.       _____________________________________________________________  Should you have questions after your visit to [facility], please contact our office at [phone] and follow the prompts.  Our office hours are 8:00 a.m. and 4:30 p.m. Monday - Friday.  Please note that voicemails left after 4:00 p.m. may not be returned until the following business day.  We are closed weekends and major holidays.  You do have access to a nurse 24-7, just call the main number to the clinic [phone] and do not press any options, hold on the line and a nurse will answer the phone.    For prescription refill requests, have your pharmacy contact our office and allow 72 hours.

## 2021-09-20 NOTE — Progress Notes (Signed)
Patient ID: Gail Cox, female   DOB: 1965/05/12, 56 y.o.   MRN: [id number] History of Present Illness: Gail Cox is a 56 year old black female,single, G1P0010, sp hysterectomy,in for a well woman gyn exam. She has pain in left breast, had biopsy in 2021 fibroadenoma, and she saw Dr Raliegh Ip today. She is worried about cancer, had first cousin with breast cancer on paternal side that died recently and 4 paternal uncles with colon cancer.  PCP is Dr Moshe Cipro.    Current Medications, Allergies, Past Medical History, Past Surgical History, Family History and Social History were reviewed in [employer] record.     Review of Systems: Patient denies any headaches, hearing loss, fatigue, blurred vision, shortness of breath, chest pain, abdominal pain, problems with bowel movements, urination, or intercourse.(Not active).  No joint pain or mood swings.  +left breast pain  +hot flashes at times too.  Physical Exam:BP 113/67 (BP Location: Left Arm, Patient Position: Sitting, Cuff Size: Normal)   Pulse 67   Ht '5\' 5"'$  (1.651 m)   Wt 172 lb 8 oz (78.2 kg)   BMI 28.71 kg/m   General:  Well developed, well nourished, no acute distress Skin:  Warm and dry Neck:  Midline trachea, normal thyroid, good ROM, no lymphadenopathy Lungs; Clear to auscultation bilaterally Breast:  No dominant palpable mass, retraction, or nipple discharge on the right, on the left the nipple is inverted, but has been, no nipple discharge, has thickness 5 FB from nipple, at 2-3 0' clock, and that is where it hurts, +tender  Cardiovascular: Regular rate and rhythm Abdomen:  Soft, non tender, no hepatosplenomegaly Pelvic:  External genitalia is normal in appearance, no lesions. Has lipoma right inner thigh.   The vagina is pale with loss of rugae. Urethra has no lesions or masses. The cervix and uterus are absent. No adnexal masses or tenderness noted.Bladder is non tender, no masses felt. Rectal: Good sphincter  tone, no polyps, or hemorrhoids felt.  Hemoccult negative. Extremities/musculoskeletal:  No swelling or varicosities noted, no clubbing or cyanosis, has had bilateral knee surgery Psych:  No mood changes, alert and cooperative,seems happy AA is 0 Fall risk is low    09/20/2021    4:10 PM 05/27/2021   11:18 AM 05/27/2021   10:49 AM  Depression screen PHQ 2/9  Decreased Interest '2 3 1  '$ Down, Depressed, Hopeless '3 3 3  '$ PHQ - 2 Score '5 6 4  '$ Altered sleeping  3 3  Tired, decreased energy 0 1 2  Change in appetite  0 0  Feeling bad or failure about yourself  0 3 3  Trouble concentrating 0 0 0  Moving slowly or fidgety/restless  0 0  Suicidal thoughts  3 1  PHQ-9 Score  16 13   Did not do GAD  Upstream - 09/20/21 1604       Pregnancy Intention Screening   Does the patient want to become pregnant in the next year? No    Does the patient's partner want to become pregnant in the next year? No    Would the patient like to discuss contraceptive options today? No      Contraception Wrap Up   Current Method Female Sterilization   hyst   End Method Female Sterilization   hyst            Examination chaperoned by Levy Pupa LPN  Impression and Plan: 1. Encounter for well woman exam with routine gynecological exam Physical in  1 year Labs with PCP - [facility] Multi-Cancer (2+38) Colonoscopy per GI  2. Encounter for screening fecal occult blood testing Hemoccult negative   - POCT occult blood stool  3. Lipoma of right lower extremity Will follow   4. Breast pain, left Pt will call the [place] in am for appt - MM DIAG BREAST TOMO BILATERAL; Future - US BREAST LTD UNI LEFT INC AXILLA; Future  5. Family history of cancer Will check [facility] Hereditary Cancer Screening  - [facility] Multi-Cancer (2+38)

## 2021-09-20 NOTE — Progress Notes (Signed)
[facility] NOTE  Patient Care Team: Fayrene Helper, MD as PCP - General Gwenlyn Found Pearletha Forge, MD as PCP - Cardiology (Cardiology) Derek Jack, MD as Medical Oncologist (Medical Oncology)  CHIEF COMPLAINTS/PURPOSE OF CONSULTATION:  Fibroadenoma of the left breast  HISTORY OF PRESENTING ILLNESS:  Gail Cox 56 y.o. female is seen in consultation today at the request of Dr. Moshe Cipro for recurrent fibroadenoma of the left breast.  Patient had history of left breast fibroadenoma, diagnosed by local excision in January 2007.  She had abnormal mammograms in 2021 and underwent biopsy of the left breast at 5 o'clock position and upper central left breast.  Both of them were suggestive of fibroadenoma.  She also reported pain at the lateral margin of the left breast excision scar from 2007 which started recently.  She has significant family history for colon cancer on her father side.  She has 1 paternal first cousin with breast cancer at age 21.  I reviewed her records extensively and collaborated the history with the patient.  SUMMARY OF ONCOLOGIC HISTORY: Oncology History   No history exists.    In terms of breast cancer risk profile:  She menarched at early age of 57 and went to menopause when she had hysterectomy in 2003.  She has been having mild stable hot flashes since 2018. She had 1 pregnancy which ended in miscarriage during third month of gestation.  She has no live born children. She  received birth control pills for approximately 5 years.  She was never exposed to fertility medications or hormone replacement therapy.    MEDICAL HISTORY:  Past Medical History:  Diagnosis Date   Allergic rhinitis, seasonal    Asthma    History of kidney stones    Hypothyroidism    Migraine headache     SURGICAL HISTORY: Past Surgical History:  Procedure Laterality Date   ABDOMINAL HYSTERECTOMY     partial hysterectomy   BREAST BIOPSY Left  10/24/2019   BREAST BIOPSY Left 10/15/2019   BREAST CYST EXCISION Left    CHOLECYSTECTOMY  1999   COLONOSCOPY  09/21/2011   Procedure: COLONOSCOPY;  Surgeon: Rogene Houston, MD;  Location: [facility];  Service: Endoscopy;  Laterality: N/A;  830   COLONOSCOPY N/A 11/11/2016   Procedure: COLONOSCOPY;  Surgeon: Rogene Houston, MD;  Location: [facility];  Service: Endoscopy;  Laterality: N/A;  [place]   Right arm   CYSTECTOMY  2007   Left breast   EXTRACORPOREAL SHOCK WAVE LITHOTRIPSY Left 03/29/2018   Procedure: EXTRACORPOREAL SHOCK WAVE LITHOTRIPSY (ESWL);  Surgeon: Raynelle Bring, MD;  Location: [facility];  Service: Urology;  Laterality: Left;   KNEE ARTHROSCOPY Bilateral    bilateral partial knee replacement   OVARIAN CYST SURGERY  2003   Removed and uterus   SPINE SURGERY  2016   cervical spine   THYROIDECTOMY  2006    SOCIAL HISTORY: Social History   Socioeconomic History   Marital status: Single    Spouse name: Not on file   Number of children: Not on file   Years of education: Not on file   Highest education level: Not on file  Occupational History   Occupation: Employed    Employer: [employer]  Tobacco Use   Smoking status: Never   Smokeless tobacco: Never  Vaping Use   Vaping Use: Never used  Substance and Sexual Activity   Alcohol use: No   Drug use:  No   Sexual activity: Not Currently    Birth control/protection: Surgical    Comment: hyst  Other Topics Concern   Not on file  Social History Narrative   No children   Social Determinants of Health   Financial Resource Strain: Low Risk  (09/20/2021)   Overall Financial Resource Strain (CARDIA)    Difficulty of Paying Living Expenses: Not hard at all  Food Insecurity: No Food Insecurity (09/20/2021)   Hunger Vital Sign    Worried About Running Out of Food in the Last Year: Never true    Ran Out of Food in the Last Year: Never true  Transportation Needs: No Transportation Needs  (09/20/2021)   PRAPARE - Hydrologist (Medical): No    Lack of Transportation (Non-Medical): No  Physical Activity: Unknown (09/20/2021)   Exercise Vital Sign    Days of Exercise per Week: Patient refused    Minutes of Exercise per Session: Patient refused  Stress: Unknown (09/20/2021)   [place]    Feeling of Stress : Patient refused  Social Connections: Moderately Isolated (09/20/2021)   Social Connection and Isolation Panel [NHANES]    Frequency of Communication with Friends and Family: More than three times a week    Frequency of Social Gatherings with Friends and Family: Never    Attends Religious Services: More than 4 times per year    Active Member of [employer] or Organizations: No    Attends Archivist Meetings: Never    Marital Status: Never married  Intimate Partner Violence: Not At Risk (09/20/2021)   Humiliation, Afraid, Rape, and Kick questionnaire    Fear of Current or Ex-Partner: No    Emotionally Abused: No    Physically Abused: No    Sexually Abused: No    FAMILY HISTORY: Family History  Problem Relation Age of Onset   Lung cancer Mother    Diabetes Father    Coronary artery disease Maternal Grandmother    Breast cancer Neg Hx     ALLERGIES:  is allergic to hydrocodone-acetaminophen and tyloxapol.  MEDICATIONS:  Current Outpatient Medications  Medication Sig Dispense Refill   albuterol (VENTOLIN HFA) 108 (90 Base) MCG/ACT inhaler Inhale 2 puffs into the lungs every 6 (six) hours as needed for wheezing or shortness of breath. 18 g 0   amLODipine (NORVASC) 5 MG tablet Take 1 tablet (5 mg total) by mouth daily. 90 tablet 3   Biotin 10000 MCG TABS Take 1 tablet by mouth daily.     Doxylamine Succinate, Sleep, (UNISOM PO) Take by mouth.     SYNTHROID 100 MCG tablet Take 1 tablet by mouth once daily 30 tablet 4   polyethylene glycol (MIRALAX) 17 g packet Take 17 g  by mouth daily.     No current facility-administered medications for this visit.    REVIEW OF SYSTEMS:   Constitutional: Denies fevers, chills or abnormal night sweats Eyes: Denies blurriness of vision, double vision or watery eyes Ears, nose, mouth, throat, and face: Denies mucositis or sore throat Respiratory: Denies cough, dyspnea or wheezes Cardiovascular: Denies palpitation, chest discomfort or lower extremity swelling Gastrointestinal:  Denies nausea, heartburn or change in bowel habits Skin: Denies abnormal skin rashes Lymphatics: Denies new lymphadenopathy or easy bruising Neurological:Denies numbness, tingling or new weaknesses Behavioral/Psych: Mood is stable, no new changes  Breast:  Denies any palpable lumps or discharge.  She has pain in the left lateral breast. All  other systems were reviewed with the patient and are negative.  PHYSICAL EXAMINATION: ECOG PERFORMANCE STATUS: 0 - Asymptomatic  Vitals:   09/20/21 1347  BP: 135/75  Pulse: 71  Resp: 18  Temp: (!) 97 F (36.1 C)  SpO2: 97%   Filed Weights   09/20/21 1347  Weight: 173 lb 3.2 oz (78.6 kg)    GENERAL:alert, no distress and comfortable SKIN: skin color, texture, turgor are normal, no rashes or significant lesions EYES: normal, conjunctiva are pink and non-injected, sclera clear OROPHARYNX:no exudate, no erythema and lips, buccal mucosa, and tongue normal  NECK: supple, thyroid normal size, non-tender, without nodularity LYMPH:  no palpable lymphadenopathy in the cervical, axillary or inguinal LUNGS: clear to auscultation and percussion with normal breathing effort HEART: regular rate & rhythm and no murmurs and no lower extremity edema ABDOMEN:abdomen soft, non-tender and normal bowel sounds Musculoskeletal:no cyanosis of digits and no clubbing  PSYCH: alert & oriented x 3 with fluent speech NEURO: no focal motor/sensory deficits BREAST: Left breast lumpectomy scar lateral margin is tender with no  palpable nodules.  Medial margin has a palpable 1 cm mass which is freely mobile.  No other masses palpable.  No adenopathy palpable in bilateral breast.  LABORATORY DATA:  I have reviewed the data as listed Lab Results  Component Value Date   WBC 4.3 05/24/2021   HGB 13.9 05/24/2021   HCT 43.0 05/24/2021   MCV 86 05/24/2021   PLT 309 05/24/2021   Lab Results  Component Value Date   NA 145 (H) 05/24/2021   K 4.3 05/24/2021   CL 104 05/24/2021   CO2 22 05/24/2021    RADIOGRAPHIC STUDIES: I have personally reviewed the radiological reports and agreed with the findings in the report.  ASSESSMENT:  1.  Left breast fibroadenoma: - 02/08/2005: Left breast local excision: 1.4 cm fibroadenoma.  No malignancy. - 10/15/2019: Left breast biopsy 5:00: Area of dense fibrosis and focal columnar cells, differential diagnosis including sclerotic fibroadenoma. - 10/24/2019: Left breast needle biopsy upper central: Fibroadenoma  2.  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  No chemical exposure.  No history of smoking. - 4 paternal uncles had colon cancer.  1 paternal aunt had colon cancer.  1 paternal first cousin had breast cancer at age 37.   PLAN:  1.  Left breast fibroadenoma: - We talked about fibroadenomas in general which are the most common benign tumors in the breast accounting for almost 50% of breast biopsies.  Fibroadenomas usually increase with estrogen exposure and regress after menopause. - Once the diagnosis is established with a core biopsy, no excision is required.  For majority of women with simple fibroadenomas, no increased risk of breast cancer was seen. - I have recommended continuing yearly mammograms without fail. - If her left breast pain gets worse, consider surgical referral. - She inquired about a general test to find cancer.  I talked about galleri test, which is not reimbursed by insurance at this time and costs around $1000.  She will let us know  if she wants to move forward.  2.  Family history of colon cancer: - She has a significant family history on her father side with 5 of her uncles and aunts developing colon cancer. - Last colonoscopy was in 2018, benign findings.  She reportedly had Cologuard test x3 since then which were negative. - I have recommended reaching out to our geneticist to see if she qualifies for genetic testing.  All questions were answered. The patient knows to call the clinic with any problems, questions or concerns.    Derek Jack, MD 09/20/21

## 2021-09-30 LAB — EMPOWER MULTI-CANCER (2 + 38): REPORT SUMMARY: NEGATIVE

## 2021-10-06 ENCOUNTER — Other Ambulatory Visit: Payer: Self-pay | Admitting: Adult Health

## 2021-10-06 ENCOUNTER — Ambulatory Visit
Admission: RE | Admit: 2021-10-06 | Discharge: 2021-10-06 | Disposition: A | Discharge status: Home / Self Care | Payer: Managed Care, Other (non HMO) | Source: Ambulatory Visit | Attending: Adult Health | Admitting: Adult Health

## 2021-10-06 ENCOUNTER — Telehealth: Payer: Self-pay | Admitting: *Deleted

## 2021-10-06 ENCOUNTER — Ambulatory Visit: Payer: Managed Care, Other (non HMO)

## 2021-10-06 DIAGNOSIS — N644 Mastodynia: Secondary | ICD-10-CM

## 2021-10-06 NOTE — Telephone Encounter (Signed)
LMOVM requesting patient return call regarding test results.

## 2021-10-23 ENCOUNTER — Other Ambulatory Visit: Payer: Self-pay | Admitting: Family Medicine

## 2021-10-25 ENCOUNTER — Encounter (INDEPENDENT_AMBULATORY_CARE_PROVIDER_SITE_OTHER): Payer: Self-pay | Admitting: *Deleted

## 2021-10-25 ENCOUNTER — Telehealth: Payer: Self-pay | Admitting: *Deleted

## 2021-10-25 NOTE — Telephone Encounter (Signed)
Pt is requesting a phone call.

## 2021-10-25 NOTE — Telephone Encounter (Signed)
LMOVM Emp[facility] negative.

## 2021-10-25 NOTE — Telephone Encounter (Signed)
LMOVM Emp[facility] test negative.

## 2021-11-01 ENCOUNTER — Other Ambulatory Visit (INDEPENDENT_AMBULATORY_CARE_PROVIDER_SITE_OTHER): Payer: Self-pay

## 2021-11-01 DIAGNOSIS — Z1211 Encounter for screening for malignant neoplasm of colon: Secondary | ICD-10-CM

## 2021-11-09 ENCOUNTER — Encounter (INDEPENDENT_AMBULATORY_CARE_PROVIDER_SITE_OTHER): Payer: Self-pay

## 2021-11-09 ENCOUNTER — Telehealth (INDEPENDENT_AMBULATORY_CARE_PROVIDER_SITE_OTHER): Payer: Self-pay

## 2021-11-09 MED ORDER — PEG 3350-KCL-NA BICARB-NACL 420 G PO SOLR: 4000.0000 mL | ORAL | 0 refills | Status: DC

## 2021-11-09 NOTE — Telephone Encounter (Signed)
Jatia Musa Ann Colburn Asper, CMA  ?

## 2021-11-09 NOTE — Telephone Encounter (Signed)
Referring MD/PCP: Dr Moshe Cipro   Procedure: Tcs  Reason/Indication:  Screening, Fam hx of colon ca  Has patient had this procedure before?  yes  If so, when, by whom and where?    Is there a family history of colon cancer?  Yes   Who?  What age when diagnosed?  Grandfather, [place]  Is patient diabetic? If yes, Type 1 or Type 2   No       Does patient have prosthetic heart valve or mechanical valve?  No   Do you have a pacemaker/defibrillator?  No   Has patient ever had endocarditis/atrial fibrillation? No   Does patient use oxygen? No   Has patient had joint replacement within last 12 months?  No   Is patient constipated or do they take laxatives? Yes   Does patient have a history of alcohol/drug use?  No   Have you had a stroke/heart attack last 6 mths? No   Do you take medicine for weight loss?  No   For female patients,: have you had a hysterectomy yes                      are you post menopausal no                       do you still have your menstrual cycle no   Is patient on blood thinner such as Coumadin, Plavix and/or Aspirin? No   Medications: synthroid 100 mcg daily, amlodipine 5 mg daily, miralax daily, biotin daily, tylenol pm or unisom prn   Allergies: NKDA  Medication Adjustment per Dr Jenetta Downer none   Procedure date & time: 12/10/21 at 11:15

## 2021-11-30 ENCOUNTER — Telehealth: Payer: Self-pay

## 2021-11-30 NOTE — Telephone Encounter (Signed)
Called patient left voicemail to call office needs appointment scheduled for visit and labs prior to visit.

## 2021-12-05 ENCOUNTER — Other Ambulatory Visit: Payer: Self-pay | Admitting: Family Medicine

## 2021-12-09 ENCOUNTER — Telehealth (INDEPENDENT_AMBULATORY_CARE_PROVIDER_SITE_OTHER): Payer: Self-pay | Admitting: *Deleted

## 2021-12-09 NOTE — Telephone Encounter (Signed)
Patient stopped by office early this afternoon asking if she would still be ok to have TCS since she had 3 small cups of grape jello. Since Dr Jenetta Downer was in a procedure I did reach out to [facility] in Endo and she said patient would be ok to proceed with TCS schd 12/10/21 but to let patient know not to eat anymore through the evening, patient was made aware and understood not to eat anymore grape jello this evening. Dr Jenetta Downer did respond after patient had  left office and said no blue or red jello

## 2021-12-10 ENCOUNTER — Encounter (HOSPITAL_COMMUNITY): Payer: Self-pay | Admitting: Gastroenterology

## 2021-12-10 ENCOUNTER — Ambulatory Visit (HOSPITAL_COMMUNITY): Payer: Managed Care, Other (non HMO) | Admitting: Anesthesiology

## 2021-12-10 ENCOUNTER — Other Ambulatory Visit: Payer: Self-pay

## 2021-12-10 ENCOUNTER — Ambulatory Visit (HOSPITAL_COMMUNITY)
Admission: RE | Admit: 2021-12-10 | Discharge: 2021-12-10 | Disposition: A | Discharge status: Home / Self Care | Payer: Managed Care, Other (non HMO) | Attending: Gastroenterology | Admitting: Gastroenterology

## 2021-12-10 ENCOUNTER — Encounter (HOSPITAL_COMMUNITY)
Admission: RE | Disposition: A | Discharge status: Home / Self Care | Payer: Self-pay | Source: Home / Self Care | Attending: Gastroenterology

## 2021-12-10 DIAGNOSIS — Z1211 Encounter for screening for malignant neoplasm of colon: Secondary | ICD-10-CM | POA: Diagnosis present

## 2021-12-10 DIAGNOSIS — K648 Other hemorrhoids: Secondary | ICD-10-CM | POA: Diagnosis not present

## 2021-12-10 DIAGNOSIS — I10 Essential (primary) hypertension: Secondary | ICD-10-CM | POA: Diagnosis not present

## 2021-12-10 DIAGNOSIS — J45909 Unspecified asthma, uncomplicated: Secondary | ICD-10-CM | POA: Insufficient documentation

## 2021-12-10 DIAGNOSIS — Z800 Family history of malignant neoplasm of digestive organs: Secondary | ICD-10-CM

## 2021-12-10 DIAGNOSIS — K5730 Diverticulosis of large intestine without perforation or abscess without bleeding: Secondary | ICD-10-CM | POA: Diagnosis not present

## 2021-12-10 DIAGNOSIS — E039 Hypothyroidism, unspecified: Secondary | ICD-10-CM | POA: Insufficient documentation

## 2021-12-10 DIAGNOSIS — Z809 Family history of malignant neoplasm, unspecified: Secondary | ICD-10-CM

## 2021-12-10 HISTORY — PX: COLONOSCOPY WITH PROPOFOL: SHX5780

## 2021-12-10 SURGERY — COLONOSCOPY WITH PROPOFOL
Anesthesia: General

## 2021-12-10 MED ORDER — PROPOFOL 500 MG/50ML IV EMUL
INTRAVENOUS | Status: DC | PRN
  Administered 2021-12-10: 150 ug/kg/min via INTRAVENOUS

## 2021-12-10 MED ORDER — LIDOCAINE HCL (CARDIAC) PF 100 MG/5ML IV SOSY
PREFILLED_SYRINGE | INTRAVENOUS | Status: DC | PRN
  Administered 2021-12-10: 60 mg via INTRATRACHEAL

## 2021-12-10 MED ORDER — LACTATED RINGERS IV SOLN
INTRAVENOUS | Status: DC
  Administered 2021-12-10: 1000 mL via INTRAVENOUS

## 2021-12-10 MED ORDER — LACTATED RINGERS IV SOLN: INTRAVENOUS | Status: DC | PRN

## 2021-12-10 MED ORDER — PROPOFOL 10 MG/ML IV BOLUS
INTRAVENOUS | Status: DC | PRN
  Administered 2021-12-10: 100 mg via INTRAVENOUS

## 2021-12-10 NOTE — Op Note (Signed)
[facility] Patient Name: Gail Cox Procedure Date: 12/10/2021 10:27 AM MRN: [id number] Date of Birth: 06/24/1965 Attending MD: Maylon Peppers , , [id number] CSN: [id number] Age: 56 Admit Type: Outpatient Procedure:                Colonoscopy Indications:              Screening for colon cancer: Family history of                            colorectal cancer in distant relative(s) before age                            42, Screening for colon cancer: Family history of                            colorectal cancer in multiple 2nd degree relatives Providers:                Maylon Peppers, [place], RN, Kristine L.                            Risa Grill, Technician Referring MD:              Medicines:                Monitored Anesthesia Care Complications:            No immediate complications. Estimated Blood Loss:     Estimated blood loss: none. Procedure:                Pre-Anesthesia Assessment:                           - Prior to the procedure, a History and Physical                            was performed, and patient medications, allergies                            and sensitivities were reviewed. The patient's                            tolerance of previous anesthesia was reviewed.                           - The risks and benefits of the procedure and the                            sedation options and risks were discussed with the                            patient. All questions were answered and informed                            consent was obtained.                           -  ASA Grade Assessment: II - A patient with mild                            systemic disease.                           After obtaining informed consent, the colonoscope                            was passed under direct vision. Throughout the                            procedure, the patient's blood pressure, pulse, and                            oxygen saturations were monitored  continuously. The                            [id number]-[id number] ([id number]) scope was introduced through                            the anus and advanced to the the cecum, identified                            by appendiceal orifice and ileocecal valve. The                            colonoscopy was performed without difficulty. The                            patient tolerated the procedure well. The quality                            of the bowel preparation was excellent. Scope In: 10:38:59 AM Scope Out: 10:59:53 AM Scope Withdrawal Time: 0 hours 12 minutes 38 seconds  Total Procedure Duration: 0 hours 20 minutes 54 seconds  Findings:      The perianal and digital rectal examinations were normal.      A few small-mouthed diverticula were found in the sigmoid colon.      Non-bleeding internal hemorrhoids were found during retroflexion. The       hemorrhoids were small. Impression:               - Diverticulosis in the sigmoid colon.                           - Non-bleeding internal hemorrhoids.                           - No specimens collected. Moderate Sedation:      Per Anesthesia Care Recommendation:           - Discharge patient to home (ambulatory).                           - Resume previous diet.                           -  Repeat colonoscopy in 5 years for screening                            purposes. Procedure Code(s):        --- Professional ---                           G8185, Colorectal cancer screening; colonoscopy on                            individual not meeting criteria for high risk Diagnosis Code(s):        --- Professional ---                           K64.8, Other hemorrhoids                           Z12.11, Encounter for screening for malignant                            neoplasm of colon                           Z80.0, Family history of malignant neoplasm of                            digestive organs                           K57.30, Diverticulosis of large  intestine without                            perforation or abscess without bleeding CPT copyright 2022 American Medical Association. All rights reserved. The codes documented in this report are preliminary and upon coder review may  be revised to meet current compliance requirements. Maylon Peppers, MD Maylon Peppers,  12/10/2021 11:07:06 AM This report has been signed electronically. Number of Addenda: 0

## 2021-12-10 NOTE — Transfer of Care (Signed)
Immediate Anesthesia Transfer of Care Note  Patient: Gail Cox  Procedure(s) Performed: COLONOSCOPY WITH PROPOFOL  Patient Location: Short Stay  Anesthesia Type:General  Level of Consciousness: awake, alert , and oriented  Airway & Oxygen Therapy: Patient Spontanous Breathing  Post-op Assessment: Report given to RN and Post -op Vital signs reviewed and stable  Post vital signs: Reviewed and stable  Last Vitals:  Vitals Value Taken Time  BP 118/68 12/10/21 1102  Temp 36.6 C 12/10/21 1102  Pulse 80 12/10/21 1102  Resp 19 12/10/21 1102  SpO2 99 % 12/10/21 1102    Last Pain:  Vitals:   12/10/21 1102  TempSrc: Oral  PainSc: 0-No pain      Patients Stated Pain Goal: 6 (76/18/48 5927)  Complications: No notable events documented.

## 2021-12-10 NOTE — Discharge Instructions (Signed)
You are being discharged to home.  Resume your previous diet.  Your physician has recommended a repeat colonoscopy in five years for screening purposes.  

## 2021-12-10 NOTE — H&P (Signed)
Gail Cox is an 56 y.o. female.   Chief Complaint: family history colon cancer HPI: 56 year old female with past medical history of asthma, hypothyroidism, kidney stones, coming for family history colon cancer.  Last colonoscopy was performed 5 years ago, no polyps were found.  The patient denies having any complaints such as melena, hematochezia, abdominal pain or distention, change in her bowel movement consistency or frequency, no changes in weight recently.  Reports multiple ankles have had colon cancer and one passed away because of this.   Past Medical History:  Diagnosis Date   Allergic rhinitis, seasonal    Asthma    History of kidney stones    Hypothyroidism    Migraine headache     Past Surgical History:  Procedure Laterality Date   ABDOMINAL HYSTERECTOMY     partial hysterectomy   BREAST BIOPSY Left 10/24/2019   BREAST BIOPSY Left 10/15/2019   BREAST CYST EXCISION Left    CHOLECYSTECTOMY  1999   COLONOSCOPY  09/21/2011   Procedure: COLONOSCOPY;  Surgeon: Rogene Houston, MD;  [facility]: [facility];  Service: Endoscopy;  Laterality: N/A;  830   COLONOSCOPY N/A 11/11/2016   Procedure: COLONOSCOPY;  Surgeon: Rogene Houston, MD;  Location: [facility];  Service: Endoscopy;  Laterality: N/A;  [place]   Right arm   CYSTECTOMY  2007   Left breast   EXTRACORPOREAL SHOCK WAVE LITHOTRIPSY Left 03/29/2018   Procedure: EXTRACORPOREAL SHOCK WAVE LITHOTRIPSY (ESWL);  Surgeon: Raynelle Bring, MD;  Location: [facility];  Service: Urology;  Laterality: Left;   KNEE ARTHROSCOPY Bilateral    bilateral partial knee replacement   OVARIAN CYST SURGERY  2003   Removed and uterus   SPINE SURGERY  2016   cervical spine   THYROIDECTOMY  2006    Family History  Problem Relation Age of Onset   Lung cancer Mother    Diabetes Father    Coronary artery disease Maternal Grandmother    Breast cancer Neg Hx    Social History:  reports that she has never smoked.  She has never used smokeless tobacco. She reports that she does not drink alcohol and does not use drugs.  Allergies:  Allergies  Allergen Reactions   Hydrocodone-Acetaminophen Nausea And Vomiting   Tyloxapol Nausea And Vomiting    Medications Prior to Admission  Medication Sig Dispense Refill   amLODipine (NORVASC) 5 MG tablet Take 1 tablet (5 mg total) by mouth daily. 90 tablet 3   BIOTIN PO Take 1 tablet by mouth daily.     diphenhydramine-acetaminophen (TYLENOL PM) 25-500 MG TABS tablet Take 2 tablets by mouth at bedtime as needed (sleep).     doxylamine, Sleep, (UNISOM) 25 MG tablet Take 37.5 mg by mouth at bedtime as needed for sleep.     Polyethylene Glycol 3350 (MIRALAX PO) Take 1 Dose by mouth daily.     polyethylene glycol-electrolytes (TRILYTE) 420 g solution Take 4,000 mLs by mouth as directed. 4000 mL 0   SYNTHROID 100 MCG tablet Take 1 tablet by mouth once daily (Patient taking differently: Take 50-100 mcg by mouth See admin instructions. Take 50 mcg on Sun, Mon, Wed, and Fri Take 100 mcg on Tues, Thurs, Sat) 30 tablet 0   albuterol (VENTOLIN HFA) 108 (90 Base) MCG/ACT inhaler Inhale 2 puffs into the lungs every 6 (six) hours as needed for wheezing or shortness of breath. 18 g 0    No results found for this or any  previous visit (from the past 48 hour(s)). No results found.  Review of Systems  All other systems reviewed and are negative.   Blood pressure (!) 146/80, pulse 66, temperature 98.4 F (36.9 C), temperature source Oral, resp. rate 11, height '5\' 5"'$  (1.651 m), weight 77.1 kg, SpO2 99 %. Physical Exam  GENERAL: The patient is AO x3, in no acute distress. HEENT: Head is normocephalic and atraumatic. EOMI are intact. Mouth is well hydrated and without lesions. NECK: Supple. No masses LUNGS: Clear to auscultation. No presence of rhonchi/wheezing/rales. Adequate chest expansion HEART: RRR, normal s1 and s2. ABDOMEN: Soft, nontender, no guarding, no peritoneal  signs, and nondistended. BS +. No masses. EXTREMITIES: Without any cyanosis, clubbing, rash, lesions or edema. NEUROLOGIC: AOx3, no focal motor deficit. SKIN: no jaundice, no rashes  Assessment/Plan 56 year old female with past medical history of asthma, hypothyroidism, kidney stones, coming for family history colon cancer.  We will proceed with colonoscopy.  Harvel Quale, MD 12/10/2021, 10:29 AM

## 2021-12-10 NOTE — Anesthesia Preprocedure Evaluation (Addendum)
Anesthesia Evaluation  Patient identified by MRN, date of birth, ID band Patient awake    Reviewed: Allergy & Precautions, H&P , NPO status , Patient's Chart, lab work & pertinent test results  Airway Mallampati: II  TM Distance: >3 FB Neck ROM: Full    Dental  (+) Dental Advisory Given, Missing   Pulmonary asthma    Pulmonary exam normal breath sounds clear to auscultation       Cardiovascular hypertension, Pt. on medications Normal cardiovascular exam Rhythm:Regular Rate:Normal     Neuro/Psych  Headaches PSYCHIATRIC DISORDERS  Depression       GI/Hepatic negative GI ROS, Neg liver ROS,,,  Endo/Other  Hypothyroidism    Renal/GU negative Renal ROS  negative genitourinary   Musculoskeletal  (+) Arthritis , Osteoarthritis,    Abdominal   Peds negative pediatric ROS (+)  Hematology negative hematology ROS (+)   Anesthesia Other Findings   Reproductive/Obstetrics negative OB ROS                             Anesthesia Physical Anesthesia Plan  ASA: 2  Anesthesia Plan: General   Post-op Pain Management: Minimal or no pain anticipated   Induction: Intravenous  PONV Risk Score and Plan: 1 and Propofol infusion  Airway Management Planned: Nasal Cannula and Natural Airway  Additional Equipment:   Intra-op Plan:   Post-operative Plan:   Informed Consent: I have reviewed the patients History and Physical, chart, labs and discussed the procedure including the risks, benefits and alternatives for the proposed anesthesia with the patient or authorized representative who has indicated his/her understanding and acceptance.     Dental advisory given  Plan Discussed with: CRNA and Surgeon  Anesthesia Plan Comments:         Anesthesia Quick Evaluation

## 2021-12-10 NOTE — Anesthesia Postprocedure Evaluation (Signed)
Anesthesia Post Note  Patient: Gail Cox  Procedure(s) Performed: COLONOSCOPY WITH PROPOFOL  Patient location during evaluation: Phase II Anesthesia Type: General Level of consciousness: awake and alert and oriented Pain management: pain level controlled Vital Signs Assessment: post-procedure vital signs reviewed and stable Respiratory status: spontaneous breathing, nonlabored ventilation and respiratory function stable Cardiovascular status: blood pressure returned to baseline and stable Postop Assessment: no apparent nausea or vomiting Anesthetic complications: no  No notable events documented.   Last Vitals:  Vitals:   12/10/21 0958 12/10/21 1102  BP: (!) 146/80 118/68  Pulse: 66 80  Resp: 11 19  Temp: 36.9 C 36.6 C  SpO2: 99% 99%    Last Pain:  Vitals:   12/10/21 1102  TempSrc: Oral  PainSc: 0-No pain                 Darius Lundberg C Etheridge Geil

## 2021-12-16 ENCOUNTER — Encounter (HOSPITAL_COMMUNITY): Payer: Self-pay | Admitting: Gastroenterology

## 2022-01-01 ENCOUNTER — Other Ambulatory Visit: Payer: Self-pay | Admitting: Family Medicine

## 2022-02-01 ENCOUNTER — Encounter: Payer: Self-pay | Admitting: Family Medicine

## 2022-02-01 ENCOUNTER — Other Ambulatory Visit: Payer: Self-pay

## 2022-02-01 ENCOUNTER — Ambulatory Visit (INDEPENDENT_AMBULATORY_CARE_PROVIDER_SITE_OTHER): Payer: 59 | Admitting: Family Medicine

## 2022-02-01 VITALS — BP 134/70 | HR 71 | Ht 65.0 in | Wt 170.0 lb

## 2022-02-01 DIAGNOSIS — I10 Essential (primary) hypertension: Secondary | ICD-10-CM | POA: Diagnosis not present

## 2022-02-01 DIAGNOSIS — E663 Overweight: Secondary | ICD-10-CM

## 2022-02-01 DIAGNOSIS — F411 Generalized anxiety disorder: Secondary | ICD-10-CM

## 2022-02-01 DIAGNOSIS — E785 Hyperlipidemia, unspecified: Secondary | ICD-10-CM

## 2022-02-01 DIAGNOSIS — R7302 Impaired glucose tolerance (oral): Secondary | ICD-10-CM

## 2022-02-01 DIAGNOSIS — E559 Vitamin D deficiency, unspecified: Secondary | ICD-10-CM

## 2022-02-01 DIAGNOSIS — E038 Other specified hypothyroidism: Secondary | ICD-10-CM

## 2022-02-01 MED ORDER — BUSPIRONE HCL 5 MG PO TABS: 5.0000 mg | ORAL_TABLET | Freq: Two times a day (BID) | ORAL | 2 refills | Status: DC

## 2022-02-01 NOTE — Patient Instructions (Addendum)
F/u to re eval blood pressure and anxiety  You will be called re lab results  New for anxiety is buspar one twice daily,    It is important that you exercise regularly at least 30 minutes 5 times a week. If you develop chest pain, have severe difficulty breathing, or feel very tired, stop exercising immediately and seek medical attention    Thanks for choosing [facility], we consider it a privelige to serve you.

## 2022-02-02 ENCOUNTER — Telehealth: Payer: Self-pay | Admitting: Family Medicine

## 2022-02-02 LAB — LIPID PANEL
Chol/HDL Ratio: 3.1 ratio (ref 0.0–4.4)
Cholesterol, Total: 213 mg/dL — ABNORMAL HIGH (ref 100–199)
HDL: 68 mg/dL (ref >39)
LDL Chol Calc (NIH): 132 mg/dL — ABNORMAL HIGH (ref 0–99)
Triglycerides: 72 mg/dL (ref 0–149)
VLDL Cholesterol Cal: 13 mg/dL (ref 5–40)

## 2022-02-02 LAB — CBC
Hematocrit: 41.3 % (ref 34.0–46.6)
Hemoglobin: 13.7 g/dL (ref 11.1–15.9)
MCH: 28.6 pg (ref 26.6–33.0)
MCHC: 33.2 g/dL (ref 31.5–35.7)
MCV: 86 fL (ref 79–97)
Platelets: 315 10*3/uL (ref 150–450)
RBC: 4.79 x10E6/uL (ref 3.77–5.28)
RDW: 13 % (ref 11.7–15.4)
WBC: 4.4 10*3/uL (ref 3.4–10.8)

## 2022-02-02 LAB — CMP14+EGFR
ALT: 15 IU/L (ref 0–32)
AST: 20 IU/L (ref 0–40)
Albumin/Globulin Ratio: 2.2 (ref 1.2–2.2)
Albumin: 4.4 g/dL (ref 3.8–4.9)
Alkaline Phosphatase: 80 IU/L (ref 44–121)
BUN/Creatinine Ratio: 14 (ref 9–23)
BUN: 10 mg/dL (ref 6–24)
Bilirubin Total: 0.7 mg/dL (ref 0.0–1.2)
CO2: 23 mmol/L (ref 20–29)
Calcium: 9.4 mg/dL (ref 8.7–10.2)
Chloride: 105 mmol/L (ref 96–106)
Creatinine, Ser: 0.74 mg/dL (ref 0.57–1.00)
Globulin, Total: 2 g/dL (ref 1.5–4.5)
Glucose: 98 mg/dL (ref 70–99)
Potassium: 4.2 mmol/L (ref 3.5–5.2)
Sodium: 143 mmol/L (ref 134–144)
Total Protein: 6.4 g/dL (ref 6.0–8.5)
eGFR: 94 mL/min/{1.73_m2} (ref >59)

## 2022-02-02 LAB — TSH: TSH: 2.95 u[IU]/mL (ref 0.450–4.500)

## 2022-02-02 LAB — HEMOGLOBIN A1C
Est. average glucose Bld gHb Est-mCnc: 120 mg/dL
Hgb A1c MFr Bld: 5.8 % — ABNORMAL HIGH (ref 4.8–5.6)

## 2022-02-02 NOTE — Telephone Encounter (Signed)
Patient returning call about lab results.

## 2022-02-02 NOTE — Telephone Encounter (Signed)
Patient aware of lab results.

## 2022-02-06 ENCOUNTER — Encounter: Payer: Self-pay | Admitting: Family Medicine

## 2022-02-06 DIAGNOSIS — F411 Generalized anxiety disorder: Secondary | ICD-10-CM | POA: Insufficient documentation

## 2022-02-06 NOTE — Assessment & Plan Note (Signed)
Controlled, no change in medication  

## 2022-02-06 NOTE — Assessment & Plan Note (Signed)
Start low dose buspar and re eval in 8 to 12 weeks

## 2022-02-06 NOTE — Assessment & Plan Note (Signed)
Patient educated about the importance of limiting  Carbohydrate intake , the need to commit to daily physical activity for a minimum of 30 minutes , and to commit weight loss. The fact that changes in all these areas will reduce or eliminate all together the development of diabetes is stressed.      Latest Ref Rng & Units 02/01/2022    9:33 AM 05/24/2021    8:13 AM 09/21/2020   10:07 AM 09/17/2019    8:47 AM 03/13/2019    8:04 AM  Diabetic Labs  HbA1c 4.8 - 5.6 % 5.8  5.8    5.6   Chol 100 - 199 mg/dL 213  201  203  232  219   HDL >39 mg/dL 68  74  68  75  82   Calc LDL 0 - 99 mg/dL 132  115  122  140  122   Triglycerides 0 - 149 mg/dL 72  67  75  71  55   Creatinine 0.57 - 1.00 mg/dL 0.74  0.77  0.76  0.75  0.79       02/01/2022    4:49 PM 02/01/2022    4:37 PM 02/01/2022    4:15 PM 02/01/2022    4:09 PM 12/10/2021   11:02 AM 12/10/2021    9:58 AM 09/20/2021    3:53 PM  BP/Weight  Systolic BP 122 449 753 005 110 211 173  Diastolic BP 70 84 88 90 68 80 67  Wt. (Lbs)    170.04  170 172.5  BMI    28.3 kg/m2  28.29 kg/m2 28.71 kg/m2       No data to display          Deteriorated , needs to lower carb intake

## 2022-02-06 NOTE — Assessment & Plan Note (Signed)
DASH diet and commitment to daily physical activity for a minimum of 30 minutes discussed and encouraged, as a part of hypertension management. The importance of attaining a healthy weight is also discussed.     02/01/2022    4:49 PM 02/01/2022    4:37 PM 02/01/2022    4:15 PM 02/01/2022    4:09 PM 12/10/2021   11:02 AM 12/10/2021    9:58 AM 09/20/2021    3:53 PM  BP/Weight  Systolic BP 010 404 591 368 599 234 144  Diastolic BP 70 84 88 90 68 80 67  Wt. (Lbs)    170.04  170 172.5  BMI    28.3 kg/m2  28.29 kg/m2 28.71 kg/m2     Controlled, no change in medication

## 2022-02-06 NOTE — Assessment & Plan Note (Signed)
Hyperlipidemia:Low fat diet discussed and encouraged.   Lipid Panel  Lab Results  Component Value Date   CHOL 213 (H) 02/01/2022   HDL 68 02/01/2022   LDLCALC 132 (H) 02/01/2022   TRIG 72 02/01/2022   CHOLHDL 3.1 02/01/2022     Needs to reduce dietary fat

## 2022-02-06 NOTE — Assessment & Plan Note (Signed)
  Patient re-educated about  the importance of commitment to a  minimum of 150 minutes of exercise per week as able.  The importance of healthy food choices with portion control discussed, as well as eating regularly and within a 12 hour window most days. The need to choose "clean , green" food 50 to 75% of the time is discussed, as well as to make water the primary drink and set a goal of 64 ounces water daily.       02/01/2022    4:09 PM 12/10/2021    9:58 AM 09/20/2021    3:53 PM  Weight /BMI  Weight 170 lb 0.6 oz 170 lb 172 lb 8 oz  Height '5\' 5"'$  (1.651 m) '5\' 5"'$  (1.651 m) '5\' 5"'$  (1.651 m)  BMI 28.3 kg/m2 28.29 kg/m2 28.71 kg/m2    unchanged

## 2022-02-06 NOTE — Progress Notes (Signed)
[name]     MRN: [id number]      DOB: Jul 29, 1965   HPI [name] is here for follow up and re-evaluation of chronic medical conditions, medication management and review of any available recent lab and radiology data.  Preventive health is updated, specifically  Cancer screening and Immunization.   Questions or concerns regarding consultations or procedures which the PT has had in the interim are  addressed. The PT denies any adverse reactions to current medications since the last visit.  C/o increased anxiety and irritable regarding job and her future, loves her job but anxious  , mildly depressed not suicidal or homicidal  ROS Denies recent fever or chills. Denies sinus pressure, nasal congestion, ear pain or sore throat. Denies chest congestion, productive cough or wheezing. Denies chest pains, palpitations and leg swelling Denies abdominal pain, nausea, vomiting,diarrhea or constipation.   Denies dysuria, frequency, hesitancy or incontinence. Denies joint pain, swelling and limitation in mobility. Denies headaches, seizures, numbness, or tingling. Denies skin break down or rash.   PE  BP 134/70   Pulse 71   Ht '5\' 5"'$  (1.651 m)   Wt 170 lb 0.6 oz (77.1 kg)   SpO2 98%   BMI 28.30 kg/m   Patient alert and oriented and in no cardiopulmonary distress.  HEENT: No facial asymmetry, EOMI,     Neck supple .  Chest: Clear to auscultation bilaterally.  CVS: S1, S2 no murmurs, no S3.Regular rate.  ABD: Soft non tender.   Ext: No edema  MS: Adequate ROM spine, shoulders, hips and knees.  Skin: Intact, no ulcerations or rash noted.  Psych: Good eye contact, normal affect. Memory intact mildly  anxious not  depressed appearing.  CNS: CN 2-12 intact, power,  normal throughout.no focal deficits noted.   Assessment & Plan  Essential hypertension DASH diet and commitment to daily physical activity for a minimum of 30 minutes discussed and encouraged, as a part  of hypertension management. The importance of attaining a healthy weight is also discussed.     02/01/2022    4:49 PM 02/01/2022    4:37 PM 02/01/2022    4:15 PM 02/01/2022    4:09 PM 12/10/2021   11:02 AM 12/10/2021    9:58 AM 09/20/2021    3:53 PM  BP/Weight  Systolic BP 937 342 876 811 572 620 355  Diastolic BP 70 84 88 90 68 80 67  Wt. (Lbs)    170.04  170 172.5  BMI    28.3 kg/m2  28.29 kg/m2 28.71 kg/m2     Controlled, no change in medication   GAD (generalized anxiety disorder) Start low dose buspar and re eval in 8 to 12 weeks  Hyperlipidemia LDL goal <100 Hyperlipidemia:Low fat diet discussed and encouraged.   Lipid Panel  Lab Results  Component Value Date   CHOL 213 (H) 02/01/2022   HDL 68 02/01/2022   LDLCALC 132 (H) 02/01/2022   TRIG 72 02/01/2022   CHOLHDL 3.1 02/01/2022     Needs to reduce dietary fat  Overweight (BMI 25.0-29.9)  Patient re-educated about  the importance of commitment to a  minimum of 150 minutes of exercise per week as able.  The importance of healthy food choices with portion control discussed, as well as eating regularly and within a 12 hour window most days. The need to choose "clean , green" food 50 to 75% of the time is discussed, as well as to make water the primary drink and  set a goal of 64 ounces water daily.       02/01/2022    4:09 PM 12/10/2021    9:58 AM 09/20/2021    3:53 PM  Weight /BMI  Weight 170 lb 0.6 oz 170 lb 172 lb 8 oz  Height '5\' 5"'$  (1.651 m) '5\' 5"'$  (1.651 m) '5\' 5"'$  (1.651 m)  BMI 28.3 kg/m2 28.29 kg/m2 28.71 kg/m2    unchanged  IGT (impaired glucose tolerance) Patient educated about the importance of limiting  Carbohydrate intake , the need to commit to daily physical activity for a minimum of 30 minutes , and to commit weight loss. The fact that changes in all these areas will reduce or eliminate all together the development of diabetes is stressed.      Latest Ref Rng & Units 02/01/2022    9:33 AM  05/24/2021    8:13 AM 09/21/2020   10:07 AM 09/17/2019    8:47 AM 03/13/2019    8:04 AM  Diabetic Labs  HbA1c 4.8 - 5.6 % 5.8  5.8    5.6   Chol 100 - 199 mg/dL 213  201  203  232  219   HDL >39 mg/dL 68  74  68  75  82   Calc LDL 0 - 99 mg/dL 132  115  122  140  122   Triglycerides 0 - 149 mg/dL 72  67  75  71  55   Creatinine 0.57 - 1.00 mg/dL 0.74  0.77  0.76  0.75  0.79       02/01/2022    4:49 PM 02/01/2022    4:37 PM 02/01/2022    4:15 PM 02/01/2022    4:09 PM 12/10/2021   11:02 AM 12/10/2021    9:58 AM 09/20/2021    3:53 PM  BP/Weight  Systolic BP 373 428 768 115 726 203 559  Diastolic BP 70 84 88 90 68 80 67  Wt. (Lbs)    170.04  170 172.5  BMI    28.3 kg/m2  28.29 kg/m2 28.71 kg/m2       No data to display          Deteriorated , needs to lower carb intake  Hypothyroidism Controlled, no change in medication

## 2022-03-01 ENCOUNTER — Telehealth: Payer: Self-pay | Admitting: Family Medicine

## 2022-03-01 NOTE — Telephone Encounter (Signed)
FYI Patient came by the office to let Dr Moshe Cipro she had to cancel her appointment for March her work schedule has changed and she will be in touch later to get this appointment rescheduled.

## 2022-03-04 ENCOUNTER — Other Ambulatory Visit: Payer: Self-pay | Admitting: Family Medicine

## 2022-03-11 ENCOUNTER — Ambulatory Visit (INDEPENDENT_AMBULATORY_CARE_PROVIDER_SITE_OTHER): Payer: 59

## 2022-03-11 ENCOUNTER — Ambulatory Visit (INDEPENDENT_AMBULATORY_CARE_PROVIDER_SITE_OTHER): Payer: 59 | Admitting: Podiatry

## 2022-03-11 ENCOUNTER — Ambulatory Visit: Payer: 59

## 2022-03-11 DIAGNOSIS — M21619 Bunion of unspecified foot: Secondary | ICD-10-CM | POA: Diagnosis not present

## 2022-03-11 DIAGNOSIS — M2041 Other hammer toe(s) (acquired), right foot: Secondary | ICD-10-CM

## 2022-03-11 NOTE — Progress Notes (Incomplete)
Subjective:   Patient ID: Gail Cox, female   DOB: 57 y.o.   MRN: [id number]   HPI Chief Complaint  Patient presents with  . Bunions    (np) right foot bunion/ hammer toe - brought a disc with xrays - discuss surgery    This has been ongoing for years. Before she thought it was broken. Over the last year it has been getting worse. She has tried different shoes.   No etoh, tobacco, dvt   ROS      Objective:  Physical Exam  ***     Assessment:  ***     Plan:  ***

## 2022-03-13 NOTE — Progress Notes (Signed)
Subjective:   Patient ID: Gail Cox, female   DOB: 57 y.o.   MRN: [id number]   HPI Chief Complaint  Patient presents with   Bunions    (np) right foot bunion/ hammer toe - brought a disc with xrays - discuss surgery   57 year old female presents the above concerns.  She states that originally she thought her toe was broken but it was not.  She is having quite a bit of pain mostly to her second toe heart since contracting but also to the bunion.  She states is getting to the point where she wants to break the toe herself.  She tried shoe modification, offloading and significant improvement.  She is interested in surgery.  No history of alcohol, tobacco use.  No history of blood clots.   Review of Systems  All other systems reviewed and are negative.  Past Medical History:  Diagnosis Date   Allergic rhinitis, seasonal    Asthma    History of kidney stones    Hypothyroidism    Migraine headache     Past Surgical History:  Procedure Laterality Date   ABDOMINAL HYSTERECTOMY     partial hysterectomy   BREAST BIOPSY Left 10/24/2019   BREAST BIOPSY Left 10/15/2019   BREAST CYST EXCISION Left    CHOLECYSTECTOMY  1999   COLONOSCOPY  09/21/2011   Procedure: COLONOSCOPY;  Surgeon: Rogene Houston, MD;  [facility];  Service: Endoscopy;  Laterality: N/A;  830   COLONOSCOPY N/A 11/11/2016   Procedure: COLONOSCOPY;  Surgeon: Rogene Houston, MD;  [facility]: [facility];  Service: Endoscopy;  Laterality: N/A;  1155   COLONOSCOPY WITH PROPOFOL N/A 12/10/2021   Procedure: COLONOSCOPY WITH PROPOFOL;  Surgeon: Harvel Quale, MD;  Location: [facility];  Service: Gastroenterology;  Laterality: N/A;  11:15 ASA 2   CYSTECTOMY  1977   Right arm   CYSTECTOMY  2007   Left breast   EXTRACORPOREAL SHOCK WAVE LITHOTRIPSY Left 03/29/2018   Procedure: EXTRACORPOREAL SHOCK WAVE LITHOTRIPSY (ESWL);  Surgeon: Raynelle Bring, MD;  Location: [facility];  Service: Urology;   Laterality: Left;   KNEE ARTHROSCOPY Bilateral    bilateral partial knee replacement   OVARIAN CYST SURGERY  2003   Removed and uterus   SPINE SURGERY  2016   cervical spine   THYROIDECTOMY  2006     Current Outpatient Medications:    albuterol (VENTOLIN HFA) 108 (90 Base) MCG/ACT inhaler, Inhale 2 puffs into the lungs every 6 (six) hours as needed for wheezing or shortness of breath. (Patient not taking: Reported on 02/01/2022), Disp: 18 g, Rfl: 0   amLODipine (NORVASC) 5 MG tablet, Take 1 tablet (5 mg total) by mouth daily., Disp: 90 tablet, Rfl: 3   BIOTIN PO, Take 1 tablet by mouth daily., Disp: , Rfl:    busPIRone (BUSPAR) 5 MG tablet, Take 1 tablet (5 mg total) by mouth 2 (two) times daily., Disp: 60 tablet, Rfl: 2   diphenhydramine-acetaminophen (TYLENOL PM) 25-500 MG TABS tablet, Take 2 tablets by mouth at bedtime as needed (sleep)., Disp: , Rfl:    doxylamine, Sleep, (UNISOM) 25 MG tablet, Take 37.5 mg by mouth at bedtime as needed for sleep., Disp: , Rfl:    Polyethylene Glycol 3350 (MIRALAX PO), Take 1 Dose by mouth daily., Disp: , Rfl:    SYNTHROID 100 MCG tablet, Take 1 tablet by mouth once daily, Disp: 30 tablet, Rfl: 0  Allergies  Allergen Reactions  Hydrocodone-Acetaminophen Nausea And Vomiting   Tyloxapol Nausea And Vomiting         Objective:  Physical Exam  General: AAO x3, NAD  Dermatological: Skin is warm, dry and supple bilateral.  Mild erythema on the dorsal PIPJ as well as the bunion from irritation inside shoe gear but there is no skin breakdown, warmth or any signs of infection.  Vascular: Dorsalis Pedis artery and Posterior Tibial artery pedal pulses are 2/4 bilateral with immedate capillary fill time.  There is no pain with calf compression, swelling, warmth, erythema.   Neruologic: Grossly intact via light touch bilateral.   Musculoskeletal: Moderate bunions present on the right foot.  Semirigid hammertoe contracture present of the second digit.   She has tenderness palpation on the PIPJ of the second toe as well as along the area of the bunion.  No pain or crepitation with MPJ range of motion no hypermobility of the first ray.  No other areas of discomfort.  Gait: Unassisted, Nonantalgic.       Assessment:   Symptomatic bunion, hammertoe deformity     Plan:  -Treatment options discussed including all alternatives, risks, and complications -Etiology of symptoms were discussed -X-rays were obtained and reviewed with the patient.  3 views of the right foot were obtained.  No subacute fracture.  Bunion, hammertoes present. -We discussed with conservative as well as surgical treatment options.  She is attempted conservative care at significant improvement she was proceed with surgical intervention.  Discussed with her PIPJ arthrodesis as well as Martha Clan bunionectomy.  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  She was hoping for 6 to 8 weeks.  It could be sooner than 3 months I cannot guarantee this.  She is going to discuss with the worker options and then to let me know. -In the meantime offloading pads were dispensed.  Discussed shoe modifications.  Trula Slade DPM

## 2022-03-30 ENCOUNTER — Encounter: Payer: Self-pay | Admitting: Family Medicine

## 2022-03-30 ENCOUNTER — Ambulatory Visit (INDEPENDENT_AMBULATORY_CARE_PROVIDER_SITE_OTHER): Payer: 59 | Admitting: Family Medicine

## 2022-03-30 VITALS — BP 132/84 | HR 107 | Ht 65.0 in | Wt 169.0 lb

## 2022-03-30 DIAGNOSIS — B9689 Other specified bacterial agents as the cause of diseases classified elsewhere: Secondary | ICD-10-CM | POA: Diagnosis not present

## 2022-03-30 DIAGNOSIS — J019 Acute sinusitis, unspecified: Secondary | ICD-10-CM | POA: Diagnosis not present

## 2022-03-30 DIAGNOSIS — R509 Fever, unspecified: Secondary | ICD-10-CM | POA: Diagnosis not present

## 2022-03-30 MED ORDER — LEVOCETIRIZINE DIHYDROCHLORIDE 5 MG PO TABS: 5.0000 mg | ORAL_TABLET | Freq: Every evening | ORAL | 1 refills | Status: DC

## 2022-03-30 MED ORDER — AZELASTINE-FLUTICASONE 137-50 MCG/ACT NA SUSP: 1.0000 | Freq: Two times a day (BID) | NASAL | 1 refills | Status: DC

## 2022-03-30 MED ORDER — AMOXICILLIN-POT CLAVULANATE 875-125 MG PO TABS: 1.0000 | ORAL_TABLET | Freq: Two times a day (BID) | ORAL | 0 refills | Status: DC

## 2022-03-30 MED ORDER — GUAIFENESIN 100 MG/5ML PO LIQD: 5.0000 mL | ORAL | 0 refills | Status: DC | PRN

## 2022-03-30 NOTE — Progress Notes (Signed)
New Patient Office Visit   Subjective   Patient ID: Gail Cox, female    DOB: 03/26/1965  Age: 57 y.o. MRN: [id number]  CC:  Chief Complaint  Patient presents with   Nasal Congestion    Patient complains of sinus congestion, chills for 4 days.     HPI Gail Cox 56 year old female, presents to clinic for nasal congestion for the past 4 days. She  has a past medical history of Allergic rhinitis, seasonal, Asthma, History of kidney stones, Hypothyroidism, and Migraine headache.  Patient here for evaluation of fever. Patient describes symptoms of  facial pressure, ear fullness, nasal congestion, chills without rigors, cough, headache, and sweats. Symptoms began 3 days ago and are unchanged since that time. Patient denies dyspnea, chest pain, weight loss, nausea and vomiting, or history of previous pneumonias. Treatment thus far includes OTC analgesics/antipyretics: not very effective Past pulmonary history is significant for no history of pneumonia or bronchitis         Outpatient Encounter Medications as of 03/30/2022  Medication Sig   albuterol (VENTOLIN HFA) 108 (90 Base) MCG/ACT inhaler Inhale 2 puffs into the lungs every 6 (six) hours as needed for wheezing or shortness of breath.   amLODipine (NORVASC) 5 MG tablet Take 1 tablet (5 mg total) by mouth daily.   amoxicillin-clavulanate (AUGMENTIN) 875-125 MG tablet Take 1 tablet by mouth 2 (two) times daily.   Azelastine-Fluticasone 137-50 MCG/ACT SUSP Place 1 spray into the nose every 12 (twelve) hours.   BIOTIN PO Take 1 tablet by mouth daily.   busPIRone (BUSPAR) 5 MG tablet Take 1 tablet (5 mg total) by mouth 2 (two) times daily.   diphenhydramine-acetaminophen (TYLENOL PM) 25-500 MG TABS tablet Take 2 tablets by mouth at bedtime as needed (sleep).   doxylamine, Sleep, (UNISOM) 25 MG tablet Take 37.5 mg by mouth at bedtime as needed for sleep.   guaiFENesin (ROBITUSSIN) 100 MG/5ML liquid Take 5 mLs by mouth  every 4 (four) hours as needed for cough or to loosen phlegm.   levocetirizine (XYZAL) 5 MG tablet Take 1 tablet (5 mg total) by mouth every evening.   Polyethylene Glycol 3350 (MIRALAX PO) Take 1 Dose by mouth daily.   SYNTHROID 100 MCG tablet Take 1 tablet by mouth once daily   No facility-administered encounter medications on file as of 03/30/2022.    Past Surgical History:  Procedure Laterality Date   ABDOMINAL HYSTERECTOMY     partial hysterectomy   BREAST BIOPSY Left 10/24/2019   BREAST BIOPSY Left 10/15/2019   BREAST CYST EXCISION Left    CHOLECYSTECTOMY  1999   COLONOSCOPY  09/21/2011   Procedure: COLONOSCOPY;  Surgeon: Rogene Houston, MD;  Location: [facility];  Service: Endoscopy;  Laterality: N/A;  830   COLONOSCOPY N/A 11/11/2016   Procedure: COLONOSCOPY;  Surgeon: Rogene Houston, MD;  Location: [facility];  Service: Endoscopy;  Laterality: N/A;  1155   COLONOSCOPY WITH PROPOFOL N/A 12/10/2021   Procedure: COLONOSCOPY WITH PROPOFOL;  Surgeon: Harvel Quale, MD;  Location: [facility];  Service: Gastroenterology;  Laterality: N/A;  11:15 ASA 2   CYSTECTOMY  1977   Right arm   CYSTECTOMY  2007   Left breast   EXTRACORPOREAL SHOCK WAVE LITHOTRIPSY Left 03/29/2018   Procedure: EXTRACORPOREAL SHOCK WAVE LITHOTRIPSY (ESWL);  Surgeon: Raynelle Bring, MD;  Location: [facility];  Service: Urology;  Laterality: Left;   KNEE ARTHROSCOPY Bilateral    bilateral partial knee  replacement   OVARIAN CYST SURGERY  2003   Removed and uterus   SPINE SURGERY  2016   cervical spine   THYROIDECTOMY  2006    Review of Systems  Constitutional:  Positive for chills and fever.  HENT:  Positive for congestion.   Respiratory:  Positive for cough. Negative for hemoptysis, sputum production, shortness of breath and wheezing.   Cardiovascular:  Negative for chest pain and palpitations.  Gastrointestinal:  Negative for abdominal pain, constipation, diarrhea, nausea and  vomiting.  Skin:  Negative for itching and rash.  Neurological:  Positive for headaches. Negative for dizziness.      Objective    BP 132/84   Pulse (!) 107   Ht '5\' 5"'$  (1.651 m)   Wt 169 lb (76.7 kg)   SpO2 94%   BMI 28.12 kg/m   Physical Exam HENT:     Right Ear: Tympanic membrane is bulging.     Left Ear: Tympanic membrane is bulging.     Mouth/Throat:     Pharynx: Posterior oropharyngeal erythema present.  Cardiovascular:     Rate and Rhythm: Normal rate.     Pulses: Normal pulses.  Pulmonary:     Effort: Pulmonary effort is normal. No respiratory distress.     Breath sounds: Normal breath sounds.  Musculoskeletal:     Cervical back: Normal range of motion and neck supple.  Skin:    General: Skin is warm and dry.  Neurological:     General: No focal deficit present.     Mental Status: She is alert.  Psychiatric:        Mood and Affect: Mood normal.        Thought Content: Thought content normal.       Assessment & Plan:  Acute bacterial sinusitis Assessment & Plan: Prescribed Augmentin 875-125 mg, Robitussin cough syrup, steroid nasal spray.Symptomatic treatment, rest, increase oral fluid intake. Take OTC tylenol medications. Follow-up for worsening or persistent symptoms.Patient verbalizes understanding regarding plan of care and all questions answered    Fever, unspecified fever cause -     COVID-19, Flu A+B and RSV  Other orders -     Amoxicillin-Pot Clavulanate; Take 1 tablet by mouth 2 (two) times daily.  Dispense: 20 tablet; Refill: 0 -     Azelastine-Fluticasone; Place 1 spray into the nose every 12 (twelve) hours.  Dispense: 23 g; Refill: 1 -     Levocetirizine Dihydrochloride; Take 1 tablet (5 mg total) by mouth every evening.  Dispense: 15 tablet; Refill: 1 -     guaiFENesin; Take 5 mLs by mouth every 4 (four) hours as needed for cough or to loosen phlegm.  Dispense: 120 mL; Refill: 0    No follow-ups on file.   Renard Hamper Ria Comment,  FNP

## 2022-03-30 NOTE — Assessment & Plan Note (Signed)
Prescribed Augmentin 875-125 mg, Robitussin cough syrup, steroid nasal spray.Symptomatic treatment, rest, increase oral fluid intake. Take OTC tylenol medications. Follow-up for worsening or persistent symptoms.Patient verbalizes understanding regarding plan of care and all questions answered

## 2022-03-30 NOTE — Patient Instructions (Signed)
It was pleasure meeting with you today. Please take medications as prescribed. Follow up with your primary health provider if any health concerns arises. If symptoms worsen please contact your primary care provider and/or visit the emergency department.

## 2022-04-02 LAB — COVID-19, FLU A+B AND RSV

## 2022-04-07 ENCOUNTER — Ambulatory Visit: Payer: 59 | Admitting: Family Medicine

## 2022-04-22 ENCOUNTER — Emergency Department (HOSPITAL_COMMUNITY): Payer: 59

## 2022-04-22 ENCOUNTER — Emergency Department (HOSPITAL_COMMUNITY)
Admission: EM | Admit: 2022-04-22 | Discharge: 2022-04-22 | Disposition: A | Discharge status: Home / Self Care | Payer: No Typology Code available for payment source | Attending: Emergency Medicine | Admitting: Emergency Medicine

## 2022-04-22 ENCOUNTER — Other Ambulatory Visit: Payer: Self-pay | Admitting: Family Medicine

## 2022-04-22 ENCOUNTER — Other Ambulatory Visit: Payer: Self-pay

## 2022-04-22 DIAGNOSIS — W01198A Fall on same level from slipping, tripping and stumbling with subsequent striking against other object, initial encounter: Secondary | ICD-10-CM | POA: Insufficient documentation

## 2022-04-22 DIAGNOSIS — S0990XA Unspecified injury of head, initial encounter: Secondary | ICD-10-CM | POA: Diagnosis present

## 2022-04-22 DIAGNOSIS — S80212A Abrasion, left knee, initial encounter: Secondary | ICD-10-CM | POA: Diagnosis not present

## 2022-04-22 DIAGNOSIS — Y990 Civilian activity done for income or pay: Secondary | ICD-10-CM | POA: Diagnosis not present

## 2022-04-22 DIAGNOSIS — S0081XA Abrasion of other part of head, initial encounter: Secondary | ICD-10-CM | POA: Insufficient documentation

## 2022-04-22 DIAGNOSIS — Y92481 Parking lot as the place of occurrence of the external cause: Secondary | ICD-10-CM | POA: Diagnosis not present

## 2022-04-22 MED ORDER — ONDANSETRON HCL 4 MG PO TABS: 4.0000 mg | ORAL_TABLET | Freq: Four times a day (QID) | ORAL | 0 refills | Status: DC

## 2022-04-22 MED ORDER — NAPROXEN 500 MG PO TABS: 500.0000 mg | ORAL_TABLET | Freq: Two times a day (BID) | ORAL | 0 refills | Status: DC | PRN

## 2022-04-22 NOTE — Discharge Instructions (Signed)
Refer to the head injury instructions listed below.  It is possible you have a mild concussion which should resolve within the next week.  Please plan to see your primary doctor for recheck if not feeling improved by then.  Your CT imaging is negative for any visible injury.  Your knee x-ray is also negative for injury from Wednesday's fall.  You may take the medicines prescribed to help you with nausea and with knee pain if you desire.

## 2022-04-22 NOTE — ED Provider Notes (Signed)
[facility] Provider Note   CSN: [id number] Arrival date & time: 04/22/22  [id number]     History  Chief Complaint  Patient presents with   Gail Cox is a 57 y.o. female.  Presenting with injury sustained in a fall which occurred 2 days ago.  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  She hit her head on the pavement and also sustained abrasion to her left knee.  She continues to have pain in the knee, although is ambulatory.  She is concerned about a vague "not quite right" feeling in her head which she cannot qualify.  She denies headache but does endorse tenderness at her forehead where she sustained an abrasion.  She also reports some mild intermittent nausea without emesis.  She denies dizziness, focal weakness, vomiting, neck pain.   The history is provided by the patient.       Home Medications Prior to Admission medications   Medication Sig Start Date End Date Taking? Authorizing Provider  naproxen (NAPROSYN) 500 MG tablet Take 1 tablet (500 mg total) by mouth 2 (two) times daily as needed for moderate pain (of left knee). 04/22/22  Yes Owenn Rothermel, Almyra Free, PA-C  ondansetron (ZOFRAN) 4 MG tablet Take 1 tablet (4 mg total) by mouth every 6 (six) hours. 04/22/22  Yes Taimane Stimmel, Almyra Free, PA-C  albuterol (VENTOLIN HFA) 108 (90 Base) MCG/ACT inhaler Inhale 2 puffs into the lungs every 6 (six) hours as needed for wheezing or shortness of breath. 02/24/19   Fayrene Helper, MD  amLODipine (NORVASC) 5 MG tablet Take 1 tablet (5 mg total) by mouth daily. 05/27/21   Fayrene Helper, MD  amoxicillin-clavulanate (AUGMENTIN) 875-125 MG tablet Take 1 tablet by mouth 2 (two) times daily. 03/30/22   Del Eli Hose, FNP  Azelastine-Fluticasone 137-50 MCG/ACT SUSP Place 1 spray into the nose every 12 (twelve) hours. 03/30/22   Del Eli Hose, FNP  BIOTIN PO Take 1 tablet by  mouth daily.    [provider]  busPIRone (BUSPAR) 5 MG tablet Take 1 tablet (5 mg total) by mouth 2 (two) times daily. 02/01/22   Fayrene Helper, MD  diphenhydramine-acetaminophen (TYLENOL PM) 25-500 MG TABS tablet Take 2 tablets by mouth at bedtime as needed (sleep).    [provider]  doxylamine, Sleep, (UNISOM) 25 MG tablet Take 37.5 mg by mouth at bedtime as needed for sleep.    [provider]  guaiFENesin (ROBITUSSIN) 100 MG/5ML liquid Take 5 mLs by mouth every 4 (four) hours as needed for cough or to loosen phlegm. 03/30/22   Del Eli Hose, FNP  levocetirizine (XYZAL) 5 MG tablet Take 1 tablet (5 mg total) by mouth every evening. 03/30/22   Del Ria Comment, Lamar Benes, FNP  Polyethylene Glycol 3350 (MIRALAX PO) Take 1 Dose by mouth daily.    [provider]  SYNTHROID 100 MCG tablet Take 1 tablet by mouth once daily 03/04/22   Fayrene Helper, MD      Allergies    Hydrocodone-acetaminophen and Tyloxapol    Review of Systems   Review of Systems  Constitutional:  Negative for fever.  HENT:  Negative for congestion and sore throat.   Eyes: Negative.   Respiratory:  Negative for chest tightness and shortness of breath.   Cardiovascular:  Negative for chest pain.  Gastrointestinal:  Negative for  abdominal pain and nausea.  Genitourinary: Negative.   Musculoskeletal:  Positive for arthralgias. Negative for joint swelling and neck pain.  Skin:  Positive for wound. Negative for rash.  Neurological:  Negative for dizziness, syncope, speech difficulty, weakness, light-headedness, numbness and headaches.  Psychiatric/Behavioral: Negative.    All other systems reviewed and are negative.   Physical Exam Updated Vital Signs BP (!) 143/71 (BP Location: Right Arm)   Pulse 62   Temp 97.7 F (36.5 C) (Oral)   Resp 20   Ht 5\' 5"  (1.651 m)   Wt 77.1 kg   SpO2 100%   BMI 28.29 kg/m  Physical Exam Vitals and nursing note reviewed.   Constitutional:      Appearance: She is well-developed.  HENT:     Head: Normocephalic.     Comments: Healing abrasion forehead, no hematoma. Eyes:     Extraocular Movements: Extraocular movements intact.     Pupils: Pupils are equal, round, and reactive to light.  Cardiovascular:     Rate and Rhythm: Normal rate.     Heart sounds: Normal heart sounds.  Pulmonary:     Effort: Pulmonary effort is normal.  Musculoskeletal:        General: Normal range of motion.     Cervical back: Normal range of motion and neck supple.  Lymphadenopathy:     Cervical: No cervical adenopathy.  Skin:    General: Skin is warm and dry.     Findings: No rash.  Neurological:     General: No focal deficit present.     Mental Status: She is alert and oriented to person, place, and time.     GCS: GCS eye subscore is 4. GCS verbal subscore is 5. GCS motor subscore is 6.     Cranial Nerves: No cranial nerve deficit.     Sensory: No sensory deficit.     Coordination: Coordination normal.     Gait: Gait normal.     Deep Tendon Reflexes: Reflexes normal.     Comments: Normal heel-shin, normal rapid alternating movements. Cranial nerves III-XII intact.  No pronator drift.  Psychiatric:        Speech: Speech normal.        Behavior: Behavior normal.        Thought Content: Thought content normal.     ED Results / Procedures / Treatments   Labs (all labs ordered are listed, but only abnormal results are displayed) Labs Reviewed - No data to display  EKG None  Radiology CT Head Wo Contrast  Result Date: 04/22/2022 CLINICAL DATA:  57 year old female status post fall two days ago striking head. Persistent symptoms. EXAM: CT HEAD WITHOUT CONTRAST TECHNIQUE: Contiguous axial images were obtained from the base of the skull through the vertex without intravenous contrast. RADIATION DOSE REDUCTION: This exam was performed according to the departmental dose-optimization program which includes automated  exposure control, adjustment of the mA and/or kV according to patient size and/or use of iterative reconstruction technique. COMPARISON:  Head CT 12/25/2020. FINDINGS: Brain: Cerebral volume remains normal. Chronic symmetric vascular calcifications at the basal ganglia. No midline shift, ventriculomegaly, mass effect, evidence of mass lesion, intracranial hemorrhage or evidence of cortically based acute infarction. Gray-white differentiation within normal limits for age. Vascular: No suspicious intracranial vascular hyperdensity. Skull: No acute osseous abnormality identified. Sinuses/Orbits: Visualized paranasal sinuses and mastoids are stable and well aerated. Other: No orbit or scalp soft tissue injury identified. IMPRESSION: No acute traumatic injury identified. Stable and normal for  age noncontrast Head CT. Electronically Signed   By: Genevie Ann M.D.   On: 04/22/2022 10:52   DG Knee Complete 4 Views Left  Result Date: 04/22/2022 CLINICAL DATA:  57 year old female status post fall this morning. EXAM: LEFT KNEE - COMPLETE 4+ VIEW COMPARISON:  Left knee series 03/19/2014. FINDINGS: Left hemiarthroplasty in the medial compartment since the 2016 comparison. Hardware appears intact and aligned. Progressed moderate to severe patellofemoral compartment joint space loss and degenerative spurring since that time. Evidence of a small or trace suprapatellar joint effusion. Lateral compartment maintained with moderate degenerative spurring. No acute osseous abnormality identified. Medial soft tissue swelling. IMPRESSION: 1. Left knee hemiarthroplasty with no acute fracture or dislocation identified. 2. 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 Trace joint effusion. Electronically Signed   By: Genevie Ann M.D.   On: 04/22/2022 10:49    Procedures Procedures    Medications Ordered in ED Medications - No data to display  ED Course/ Medical Decision Making/ A&P                              Medical Decision Making Patient presenting with a head injury and left knee injury from a trip and fall which occurred 2 days ago.  Has persistent intermittent mild nausea concerning for a possible mild concussion.  Her CT scan is reassuring as is her normal neurologic exam today.  She is given minor head injury instructions, plan follow-up with her primary provider if her symptoms are not resolved over the next week.  Amount and/or Complexity of Data Reviewed Radiology: ordered.    Details: Reviewed and agree with interpretation, no acute findings on head CT or left knee film.  Risk Prescription drug management.           Final Clinical Impression(s) / ED Diagnoses Final diagnoses:  Minor head injury, initial encounter  Abrasion of left knee, initial encounter    Rx / DC Orders ED Discharge Orders          Ordered    naproxen (NAPROSYN) 500 MG tablet  2 times daily PRN        04/22/22 1057    ondansetron (ZOFRAN) 4 MG tablet  Every 6 hours        04/22/22 1057              Evalee Jefferson, PA-C 04/22/22 1100    Milton Ferguson, MD 04/22/22 1751

## 2022-04-22 NOTE — ED Notes (Signed)
Patient transported to CT 

## 2022-04-22 NOTE — ED Triage Notes (Signed)
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 Pt hit her head and right knee, abrasions noted to both areas. Pt wants to be seen because her head feels funny. Pt verbalized nausea off an don since [place] but no vomiting. Pt ambulated to room .

## 2022-04-22 NOTE — ED Notes (Signed)
Patient Alert and oriented to baseline. Stable and ambulatory to baseline. Patient verbalized understanding of the discharge instructions.  Patient belongings were taken by the patient.   

## 2022-05-27 ENCOUNTER — Other Ambulatory Visit: Payer: Self-pay | Admitting: Family Medicine

## 2022-07-08 ENCOUNTER — Other Ambulatory Visit: Payer: Self-pay | Admitting: Family Medicine

## 2022-08-21 ENCOUNTER — Other Ambulatory Visit: Payer: Self-pay | Admitting: Family Medicine

## 2022-08-24 ENCOUNTER — Other Ambulatory Visit: Payer: Self-pay | Admitting: Family Medicine

## 2022-08-25 ENCOUNTER — Telehealth: Payer: Self-pay | Admitting: Family Medicine

## 2022-08-25 NOTE — Telephone Encounter (Signed)
Patient came by office needs Tdap, shingrix and Hep A and B shots (form in provider box patient dropped off), can she come by office anytime and get all 3 of these done by the nurse. Patient call back # [phone].

## 2022-08-29 ENCOUNTER — Other Ambulatory Visit: Payer: Self-pay | Admitting: Family Medicine

## 2022-08-29 DIAGNOSIS — Z1231 Encounter for screening mammogram for malignant neoplasm of breast: Secondary | ICD-10-CM

## 2022-08-30 NOTE — Telephone Encounter (Signed)
Patient aware she can come to the office for vaccines hep a is ordered and in fridge on [employer]

## 2022-09-05 ENCOUNTER — Ambulatory Visit: Payer: 59

## 2022-09-12 ENCOUNTER — Ambulatory Visit (INDEPENDENT_AMBULATORY_CARE_PROVIDER_SITE_OTHER): Payer: 59

## 2022-09-12 DIAGNOSIS — Z23 Encounter for immunization: Secondary | ICD-10-CM

## 2022-09-22 ENCOUNTER — Encounter: Payer: Self-pay | Admitting: Adult Health

## 2022-09-22 ENCOUNTER — Ambulatory Visit (INDEPENDENT_AMBULATORY_CARE_PROVIDER_SITE_OTHER): Payer: 59 | Admitting: Adult Health

## 2022-09-22 VITALS — BP 137/81 | HR 74 | Ht 65.0 in | Wt 176.5 lb

## 2022-09-22 DIAGNOSIS — Z1211 Encounter for screening for malignant neoplasm of colon: Secondary | ICD-10-CM | POA: Diagnosis not present

## 2022-09-22 DIAGNOSIS — Z01419 Encounter for gynecological examination (general) (routine) without abnormal findings: Secondary | ICD-10-CM | POA: Diagnosis not present

## 2022-09-22 DIAGNOSIS — D1723 Benign lipomatous neoplasm of skin and subcutaneous tissue of right leg: Secondary | ICD-10-CM | POA: Diagnosis not present

## 2022-09-22 DIAGNOSIS — L720 Epidermal cyst: Secondary | ICD-10-CM

## 2022-09-22 LAB — HEMOCCULT GUIAC POC 1CARD (OFFICE): Fecal Occult Blood, POC: NEGATIVE

## 2022-09-22 NOTE — Progress Notes (Signed)
Patient ID: Gail Cox, female   DOB: 1965-10-31, 57 y.o.   MRN: [id number] History of Present Illness: Gail Cox is a 57 year old black female,single, sp hysterectomy in for a well woman gyn exam. She is having surgery on foot in October and retiring in December from [employer] after 30 years.  PCP is Dr Lodema Hong.  Current Medications, Allergies, Past Medical History, Past Surgical History, Family History and Social History were reviewed in [employer] record.     Review of Systems: Patient denies any headaches, hearing loss, fatigue, blurred vision, shortness of breath, chest pain, abdominal pain, problems with bowel movements, urination, or intercourse(not active). No joint pain or mood swings.     Physical Exam:BP 137/81 (BP Location: Left Arm, Patient Position: Sitting, Cuff Size: Normal)   Pulse 74   Ht 5\' 5"  (1.651 m)   Wt 176 lb 8 oz (80.1 kg)   BMI 29.37 kg/m   General:  Well developed, well nourished, no acute distress Skin:  Warm and dry Neck:  Midline trachea, normal thyroid, good ROM, no lymphadenopathy Lungs; Clear to auscultation bilaterally Breast:  No dominant palpable mass, retraction, or nipple discharge, has epidermal cyst right breat about 5 0'clock near sternum. Cardiovascular: Regular rate and rhythm Abdomen:  Soft, non tender, no hepatosplenomegaly Pelvic:  External genitalia is normal in appearance, no lesions. Has lipoma right inner thigh. The vagina is normal in appearance. Urethra has no lesions or masses. The cervix and uterus are absent.  No adnexal masses or tenderness noted.Bladder is non tender, no masses felt. Rectal: Good sphincter tone, no polyps, or hemorrhoids felt.  Hemoccult negative. Extremities/musculoskeletal:  No swelling or varicosities noted, no clubbing or cyanosis Psych:  No mood changes, alert and cooperative,seems happy AA is 0 Fall risk is low    09/22/2022    1:37 PM 03/30/2022    4:12 PM 02/01/2022    4:11 PM   Depression screen PHQ 2/9  Decreased Interest 0 1 3  Down, Depressed, Hopeless 0 0 0  PHQ - 2 Score 0 1 3  Altered sleeping 2 3 1   Tired, decreased energy 0 1 3  Change in appetite 0 0 0  Feeling bad or failure about yourself  0 1 0  Trouble concentrating 0 0 0  Moving slowly or fidgety/restless 0 0 0  Suicidal thoughts 0 0 0  PHQ-9 Score 2 6 7   Difficult doing work/chores  Not difficult at all Extremely dIfficult       09/22/2022    1:37 PM 02/01/2022    4:12 PM 02/05/2020   11:32 AM  GAD 7 : Generalized Anxiety Score  Nervous, Anxious, on Edge 0 3 1  Control/stop worrying 0 3 1  Worry too much - different things 0 3 1  Trouble relaxing 0 3 3  Restless 0 0 1  Easily annoyed or irritable 0 0 2  Afraid - awful might happen 0 1 2  Total GAD 7 Score 0 13 11  Anxiety Difficulty  Somewhat difficult       Upstream - 09/22/22 1342       Pregnancy Intention Screening   Does the patient want to become pregnant in the next year? N/A    Does the patient's partner want to become pregnant in the next year? N/A    Would the patient like to discuss contraceptive options today? N/A      Contraception Wrap Up   Current Method Female Sterilization  hyst   End Method Female Sterilization   hyst   Contraception Counseling Provided No            Examination chaperoned by Malachy Mood LPN  Impression and plan: 1. Encounter for well woman exam with routine gynecological exam Physical in 1 year Labs with PCP Mammogram scheduled for 10/11/22 Colonoscopy per GI   2. Encounter for screening fecal occult blood testing Hemoccult was negative  - POCT occult blood stool  3. Lipoma of right lower extremity  4. Epidermal cyst

## 2022-09-23 ENCOUNTER — Ambulatory Visit (INDEPENDENT_AMBULATORY_CARE_PROVIDER_SITE_OTHER): Payer: 59 | Admitting: Podiatry

## 2022-09-23 DIAGNOSIS — M2041 Other hammer toe(s) (acquired), right foot: Secondary | ICD-10-CM | POA: Diagnosis not present

## 2022-09-23 DIAGNOSIS — M21961 Unspecified acquired deformity of right lower leg: Secondary | ICD-10-CM | POA: Diagnosis not present

## 2022-09-23 DIAGNOSIS — M21619 Bunion of unspecified foot: Secondary | ICD-10-CM | POA: Diagnosis not present

## 2022-09-23 NOTE — Patient Instructions (Signed)

## 2022-09-23 NOTE — Progress Notes (Unsigned)
Subjective: Chief Complaint  Patient presents with   Consult    Surgery consult for right foot bunion and 2nd hammertoe.    Foot Pain    C/o pain to the ball of right foot near the 2nd right toe. Only occurs in the mornings and once she begins to walk pain goes away. She is using the gel pads which helps with the pain.    57 year old female presents the office with above concerns.  She states that she wants to proceed with surgery given ongoing discomfort, pain.  She is try to modifications, offloading without significant improvement.  No history of DVT No tobacco use  Objective: AAO x3, NAD DP/PT pulses palpable bilaterally, CRT less than 3 seconds Moderate bunions present of the right foot.  Rigid hammertoe contracture present of the right second digit there is tenderness palpation on the right second MPJ.  There is no other areas of pinpoint tenderness.  No pain or crepitation with MPJ range of motion.  No hypermobility the first ray. No pain with calf compression, swelling, warmth, erythema  Assessment: 57 year old female with bunion deformity, hammertoe, metatarsal deformity  Plan: -All treatment options discussed with the patient including all alternatives, risks, complications.  -I continued the x-rays with her.  Moderate increase in first intermetatarsal angle.  Hammertoes present.  No evidence of acute fracture. -We can discuss both conservative as well as surgical options.  She is attempted conservative treatment significant improvement.  She was to proceed with surgical invention.  I discussed with her bunionectomy including also, Aiken bunionectomy with hammertoe repair of the second digit and shortening osteotomy second metatarsal.  She wishes to proceed with this. -The incision placement as well as the postoperative course was discussed with the patient. I discussed risks of the surgery which include, but not limited to, infection, bleeding, pain, swelling, need for further  surgery, delayed or nonhealing, painful or ugly scar, numbness or sensation changes, over/under correction, recurrence, transfer lesions, further deformity, hardware failure, DVT/PE, loss of toe/foot. Patient understands these risks and wishes to proceed with surgery. The surgical consent was reviewed with the patient all 3 pages were signed. No promises or guarantees were given to the outcome of the procedure. All questions were answered to the best of my ability. Before the surgery the patient was encouraged to call the office if there is any further questions. The surgery will be performed at the [facility] on an outpatient basis. -Patient encouraged to call the office with any questions, concerns, change in symptoms.   Vivi Barrack DPM

## 2022-10-06 ENCOUNTER — Other Ambulatory Visit: Payer: Self-pay | Admitting: Family Medicine

## 2022-10-11 ENCOUNTER — Ambulatory Visit
Admission: RE | Admit: 2022-10-11 | Discharge: 2022-10-11 | Disposition: A | Discharge status: Home / Self Care | Payer: 59 | Source: Ambulatory Visit | Attending: Family Medicine | Admitting: Family Medicine

## 2022-10-11 ENCOUNTER — Encounter: Payer: Self-pay | Admitting: Podiatry

## 2022-10-11 DIAGNOSIS — Z1231 Encounter for screening mammogram for malignant neoplasm of breast: Secondary | ICD-10-CM

## 2022-10-12 ENCOUNTER — Other Ambulatory Visit: Payer: Self-pay

## 2022-10-12 DIAGNOSIS — E785 Hyperlipidemia, unspecified: Secondary | ICD-10-CM

## 2022-10-12 DIAGNOSIS — I10 Essential (primary) hypertension: Secondary | ICD-10-CM

## 2022-10-12 DIAGNOSIS — R7302 Impaired glucose tolerance (oral): Secondary | ICD-10-CM

## 2022-10-13 ENCOUNTER — Telehealth: Payer: Self-pay | Admitting: *Deleted

## 2022-10-13 NOTE — Telephone Encounter (Signed)
-----   Message from Cyril Mourning sent at 10/12/2022  1:13 PM EDT ----- Let her know mammogram was negative,repeat in 1 year THX

## 2022-10-13 NOTE — Telephone Encounter (Signed)
Pt aware mammogram was negative and to have repeated in 1 year. Pt voiced understanding. JSY

## 2022-11-10 ENCOUNTER — Other Ambulatory Visit: Payer: Self-pay | Admitting: Family Medicine

## 2022-11-21 ENCOUNTER — Telehealth: Payer: Self-pay | Admitting: Podiatry

## 2022-11-21 NOTE — Telephone Encounter (Signed)
DOS- 11/30/2022  DOUBLE OSTEOTOMY RT-28299 MET. OSTEOTOMY 2ND RT - 28308 HAMMERTOE REPAIR 2ND [id number]  [facility] EFFECTIVE DATE- 01/31/2022  DEDUCTIBLE- $1300.00 WITH REMAINING $789.82 OOP- $3200.00 WITH REMAINING $2611.19 COINSURANCE- 20%  PER THE [employer] WEBSITE PORTAL, PRIOR AUTH HAS BEEN APPROVED FOR CPT CODES [id number] AND [id number]. GOOD FROM 11/30/2022 - 02/28/2023.  AUTH REFERENCE NUMBER:  [id number]

## 2022-11-25 ENCOUNTER — Telehealth: Payer: Self-pay | Admitting: Family Medicine

## 2022-11-25 NOTE — Telephone Encounter (Signed)
FYI, patient to let Dr Lodema Hong know she doing her blood work Monday and surgery Wednesday, 10.30.2024.

## 2022-11-28 ENCOUNTER — Other Ambulatory Visit: Payer: Self-pay | Admitting: Family Medicine

## 2022-11-28 ENCOUNTER — Telehealth: Payer: Self-pay | Admitting: Family Medicine

## 2022-11-28 ENCOUNTER — Ambulatory Visit (INDEPENDENT_AMBULATORY_CARE_PROVIDER_SITE_OTHER): Payer: 59

## 2022-11-28 DIAGNOSIS — Z23 Encounter for immunization: Secondary | ICD-10-CM

## 2022-11-28 NOTE — Telephone Encounter (Signed)
Spoke to patient, she will keep you posted she will go in for her post op on  11.04.2024 and will let our office know if she needs to reschedule.

## 2022-11-28 NOTE — Telephone Encounter (Signed)
FYI, patient asked to pass this on to the provider, she will have foot surgery Wednesday, 10.30.2024 may not be able to drive for 6 weeks depends on the type boot if able to drive. Will keep provider posted on how well surgery goes.

## 2022-11-28 NOTE — Telephone Encounter (Signed)
Refills sent to pharmacy. 

## 2022-11-28 NOTE — Telephone Encounter (Signed)
Patient came by office only has 1 pill left need refill amLODipine (NORVASC) 5 MG tablet [[id number]]  Pharmacy: [facility]

## 2022-11-29 LAB — BMP8+EGFR
BUN/Creatinine Ratio: 13 (ref 9–23)
BUN: 11 mg/dL (ref 6–24)
CO2: 19 mmol/L — ABNORMAL LOW (ref 20–29)
Calcium: 9.6 mg/dL (ref 8.7–10.2)
Chloride: 107 mmol/L — ABNORMAL HIGH (ref 96–106)
Creatinine, Ser: 0.84 mg/dL (ref 0.57–1.00)
Glucose: 102 mg/dL — ABNORMAL HIGH (ref 70–99)
Potassium: 4.4 mmol/L (ref 3.5–5.2)
Sodium: 144 mmol/L (ref 134–144)
eGFR: 80 mL/min/{1.73_m2} (ref >59)

## 2022-11-29 LAB — LIPID PANEL
Chol/HDL Ratio: 2.7 {ratio} (ref 0.0–4.4)
Cholesterol, Total: 196 mg/dL (ref 100–199)
HDL: 73 mg/dL (ref >39)
LDL Chol Calc (NIH): 112 mg/dL — ABNORMAL HIGH (ref 0–99)
Triglycerides: 57 mg/dL (ref 0–149)
VLDL Cholesterol Cal: 11 mg/dL (ref 5–40)

## 2022-11-29 LAB — HEMOGLOBIN A1C
Est. average glucose Bld gHb Est-mCnc: 123 mg/dL
Hgb A1c MFr Bld: 5.9 % — ABNORMAL HIGH (ref 4.8–5.6)

## 2022-11-29 LAB — TSH: TSH: 1.12 u[IU]/mL (ref 0.450–4.500)

## 2022-11-30 ENCOUNTER — Other Ambulatory Visit: Payer: Self-pay | Admitting: Podiatry

## 2022-11-30 DIAGNOSIS — M21541 Acquired clubfoot, right foot: Secondary | ICD-10-CM | POA: Diagnosis not present

## 2022-11-30 DIAGNOSIS — M2041 Other hammer toe(s) (acquired), right foot: Secondary | ICD-10-CM | POA: Diagnosis not present

## 2022-11-30 DIAGNOSIS — M2011 Hallux valgus (acquired), right foot: Secondary | ICD-10-CM | POA: Diagnosis not present

## 2022-11-30 MED ORDER — PROMETHAZINE HCL 25 MG PO TABS: 25.0000 mg | ORAL_TABLET | Freq: Three times a day (TID) | ORAL | 0 refills | Status: DC | PRN

## 2022-11-30 MED ORDER — OXYCODONE-ACETAMINOPHEN 5-325 MG PO TABS: 1.0000 | ORAL_TABLET | Freq: Four times a day (QID) | ORAL | 0 refills | Status: DC | PRN

## 2022-11-30 MED ORDER — CEPHALEXIN 500 MG PO CAPS: 500.0000 mg | ORAL_CAPSULE | Freq: Three times a day (TID) | ORAL | 0 refills | Status: DC

## 2022-12-01 ENCOUNTER — Telehealth: Payer: Self-pay | Admitting: Podiatry

## 2022-12-01 NOTE — Telephone Encounter (Signed)
I called the patient postop. She is having some pain. She did take the pain medication. Had some fevers/chills last night but feeling better today. She did rent a knee scooter. Encouraged to keep elevated. No chest pain, SOB. Encouraged to call back with any questions or concerns, otherwise I will see her next week.

## 2022-12-05 ENCOUNTER — Ambulatory Visit (INDEPENDENT_AMBULATORY_CARE_PROVIDER_SITE_OTHER): Payer: 59

## 2022-12-05 ENCOUNTER — Encounter: Payer: Self-pay | Admitting: Podiatry

## 2022-12-05 ENCOUNTER — Ambulatory Visit (INDEPENDENT_AMBULATORY_CARE_PROVIDER_SITE_OTHER): Payer: 59 | Admitting: Podiatry

## 2022-12-05 VITALS — Ht 65.0 in | Wt 176.5 lb

## 2022-12-05 DIAGNOSIS — M21619 Bunion of unspecified foot: Secondary | ICD-10-CM

## 2022-12-05 DIAGNOSIS — M21611 Bunion of right foot: Secondary | ICD-10-CM | POA: Diagnosis not present

## 2022-12-05 DIAGNOSIS — Z9889 Other specified postprocedural states: Secondary | ICD-10-CM

## 2022-12-05 DIAGNOSIS — M2041 Other hammer toe(s) (acquired), right foot: Secondary | ICD-10-CM

## 2022-12-05 NOTE — Progress Notes (Signed)
Chief Complaint  Patient presents with   Routine Post Op    1ST Post op visit since surgery, pt states foot feels fine no pain, complains of boot is to heavy and it weighs her down.    HPI: 57 y.o. female presents today for her first postoperative appointment today.  She had surgery by Dr. Loreta Ave to correct a right bunion, right second hammertoe, and right second metatarsal deformity.  As she does have a K wire in the second toe.  She has hardware in the first ray.  She is wearing her cam walker but states that she does not like this and it feels tight and feels like it is cutting into her ankle.  She states that she has been elevating above the level of her heart and occasionally using the ice.  Pain is not severe.  Denies fever, chest pain, shortness of breath.  Denies calf pain.  States that she has had no injury since the surgery was performed.  Past Medical History:  Diagnosis Date   Allergic rhinitis, seasonal    Asthma    History of kidney stones    Hypothyroidism    Migraine headache     Past Surgical History:  Procedure Laterality Date   ABDOMINAL HYSTERECTOMY     partial hysterectomy   BREAST BIOPSY Left 10/24/2019   BREAST BIOPSY Left 10/15/2019   BREAST CYST EXCISION Left    CHOLECYSTECTOMY  1999   COLONOSCOPY  09/21/2011   Procedure: COLONOSCOPY;  Surgeon: Malissa Hippo, MD;  Location: [facility];  Service: Endoscopy;  Laterality: N/A;  830   COLONOSCOPY N/A 11/11/2016   Procedure: COLONOSCOPY;  Surgeon: Malissa Hippo, MD;  Location: [facility];  Service: Endoscopy;  Laterality: N/A;  1155   COLONOSCOPY WITH PROPOFOL N/A 12/10/2021   Procedure: COLONOSCOPY WITH PROPOFOL;  Surgeon: Dolores Frame, MD;  Location: [facility];  Service: Gastroenterology;  Laterality: N/A;  11:15 ASA 2   CYSTECTOMY  1977   Right arm   CYSTECTOMY  2007   Left breast   EXTRACORPOREAL SHOCK WAVE LITHOTRIPSY Left 03/29/2018   Procedure: EXTRACORPOREAL SHOCK  WAVE LITHOTRIPSY (ESWL);  Surgeon: Heloise Purpura, MD;  Location: [facility];  Service: Urology;  Laterality: Left;   KNEE ARTHROSCOPY Bilateral    bilateral partial knee replacement   OVARIAN CYST SURGERY  2003   Removed and uterus   SPINE SURGERY  2016   cervical spine   THYROIDECTOMY  2006    Allergies  Allergen Reactions   Hydrocodone-Acetaminophen Nausea And Vomiting   Tyloxapol Nausea And Vomiting    Physical Exam: Palpable pedal pulses to the right foot.  Mild localized edema on the surgical area but there is also some edema migrating towards the ankle area and this could be from elevation of the extremity.  Minimal ecchymosis.  Sutures are holding well.  No dehiscence or signs of necrosis are noted.  K wires in good position.  Toes are in correct position.  Radiographic Exam (right foot, 3 weightbearing views, 12/05/2022):  Normal osseous mineralization.  Right first MPJ is congruous.  A correction of the bunion deformity is maintained at this time.  The K wire is in good position and the second toe.  The Weil osteotomy correction is maintained.  Assessment/Plan of Care: 1. Post-operative state   2. Bunion   3. Hammer toe of right foot     Discussed clinical and radiographic findings with patient today.  Informed the patient that today's visit is to check for any signs of infection, check her pain levels, and make sure there is been no injury since surgery.  The incisions were redressed with antibiotic ointment and Xeroform gauze, followed by a dry sterile gauze dressing, then a final Ace wrap around the foot and ankle.  She was placed back into the cam walker and will continue with this.  Follow-up with Dr. Loreta Ave in 1 to 2 weeks for postop #2   Clerance Lav, DPM, FACFAS [facility]     [address] [place], [place] [address]                Office [phone]  Fax [phone]

## 2022-12-08 ENCOUNTER — Ambulatory Visit (INDEPENDENT_AMBULATORY_CARE_PROVIDER_SITE_OTHER): Payer: 59 | Admitting: Family Medicine

## 2022-12-08 ENCOUNTER — Encounter: Payer: Self-pay | Admitting: Family Medicine

## 2022-12-08 VITALS — BP 150/94 | HR 81 | Ht 65.0 in | Wt 177.0 lb

## 2022-12-08 DIAGNOSIS — R7302 Impaired glucose tolerance (oral): Secondary | ICD-10-CM

## 2022-12-08 DIAGNOSIS — J4520 Mild intermittent asthma, uncomplicated: Secondary | ICD-10-CM | POA: Diagnosis not present

## 2022-12-08 DIAGNOSIS — E785 Hyperlipidemia, unspecified: Secondary | ICD-10-CM

## 2022-12-08 DIAGNOSIS — E038 Other specified hypothyroidism: Secondary | ICD-10-CM

## 2022-12-08 DIAGNOSIS — I10 Essential (primary) hypertension: Secondary | ICD-10-CM

## 2022-12-08 DIAGNOSIS — G4709 Other insomnia: Secondary | ICD-10-CM

## 2022-12-08 MED ORDER — AMLODIPINE BESYLATE 2.5 MG PO TABS: 2.5000 mg | ORAL_TABLET | Freq: Every day | ORAL | 3 refills | Status: DC

## 2022-12-08 NOTE — Patient Instructions (Addendum)
F/U first week in January , re evaluate blood pressure, call if you need me sooner  New higher total daily dose amlodipine is 7.5 mg daily (5mg  and new is 2.5 mg amlodipine, take BOTH tablets every day together)    Cholesterol has improved, congrats  Stay on samee dose of thyroid medication  OK to take OTC medication, one for sleep, good sleep is vital for good health!  PLEASE stop drinking so much lemonade and fruit juices as this is causing blood sugar to increase  Thankful right foot surgery is successful  Best for 2025!

## 2022-12-11 NOTE — Assessment & Plan Note (Signed)
Controlled with episodic need for albuterol

## 2022-12-11 NOTE — Assessment & Plan Note (Signed)
Hyperlipidemia:Low fat diet discussed and encouraged.   Lipid Panel  Lab Results  Component Value Date   CHOL 196 11/28/2022   HDL 73 11/28/2022   LDLCALC 112 (H) 11/28/2022   TRIG 57 11/28/2022   CHOLHDL 2.7 11/28/2022     Needs to lower fat intake , improved, diet only

## 2022-12-11 NOTE — Progress Notes (Signed)
Gail Cox     MRN: [id number]      DOB: 07-06-1965  Chief Complaint  Patient presents with   Follow-up    Follow up    HPI Gail Cox is here for follow up and re-evaluation of chronic medical conditions, medication management and review of any available recent lab and radiology data.  Preventive health is updated, specifically  Cancer screening and Immunization.   Questions or concerns regarding consultations or procedures which the PT has had in the interim are  addressed.Currently recovering from surgery on right foot and doing well The PT denies any adverse reactions to current medications since the last visit.     ROS Denies recent fever or chills. Denies sinus pressure, nasal congestion, ear pain or sore throat. Denies chest congestion, productive cough or wheezing. Denies chest pains, palpitations and leg swelling Denies abdominal pain, nausea, vomiting,diarrhea or constipation.   Denies dysuria, frequency, hesitancy or incontinence.  Denies headaches, seizures, numbness, or tingling. Denies depression, anxiety or insomnia. Denies skin break down or rash.   PE  BP (!) 150/94   Pulse 81   Ht 5\' 5"  (1.651 m)   Wt 177 lb (80.3 kg)   SpO2 96%   BMI 29.45 kg/m   Patient alert and oriented and in no cardiopulmonary distress.  HEENT: No facial asymmetry, EOMI,     Neck supple .  Chest: Clear to auscultation bilaterally.  CVS: S1, S2 no murmurs, no S3.Regular rate.  ABD: Soft non tender.   Ext: No edema  MS: Adequate ROM spine, shoulders, hips and knees.  Skin: Intact, no ulcerations or rash noted.  Psych: Good eye contact, normal affect. Memory intact not anxious or depressed appearing.  CNS: CN 2-12 intact, power,  normal throughout.no focal deficits noted.   Assessment & Plan  Essential hypertension Uncontrolled , add amlodipine 2.5 mg with 2 monthf/u DASH diet and commitment to daily physical activity for a minimum of 30 minutes discussed  and encouraged, as a part of hypertension management. The importance of attaining a healthy weight is also discussed.     12/08/2022    4:16 PM 12/08/2022    4:15 PM 12/08/2022    3:32 PM 12/05/2022    9:18 AM 09/22/2022    1:38 PM 04/22/2022    9:00 AM 04/22/2022    8:12 AM  BP/Weight  Systolic BP 150 148 123  137 144 143  Diastolic BP 94 94 74  81 80 71  Wt. (Lbs)   177 176.5 176.5    BMI   29.45 kg/m2 29.37 kg/m2 29.37 kg/m2         Asthma in adult Controlled with episodic need for albuterol  Hyperlipidemia LDL goal <100 Hyperlipidemia:Low fat diet discussed and encouraged.   Lipid Panel  Lab Results  Component Value Date   CHOL 196 11/28/2022   HDL 73 11/28/2022   LDLCALC 112 (H) 11/28/2022   TRIG 57 11/28/2022   CHOLHDL 2.7 11/28/2022     Needs to lower fat intake , improved, diet only  IGT (impaired glucose tolerance) Patient educated about the importance of limiting  Carbohydrate intake , the need to commit to daily physical activity for a minimum of 30 minutes , and to commit weight loss. The fact that changes in all these areas will reduce or eliminate all together the development of diabetes is stressed.  Deteriorated, will work on food choice      Latest Ref Rng & Units 11/28/2022  8:08 AM 02/01/2022    9:33 AM 05/24/2021    8:13 AM 09/21/2020   10:07 AM 09/17/2019    8:47 AM  Diabetic Labs  HbA1c 4.8 - 5.6 % 5.9  5.8  5.8     Chol 100 - 199 mg/dL 166  063  016  010  932   HDL >39 mg/dL 73  68  74  68  75   Calc LDL 0 - 99 mg/dL 355  732  202  542  706   Triglycerides 0 - 149 mg/dL 57  72  67  75  71   Creatinine 0.57 - 1.00 mg/dL 2.37  6.28  3.15  1.76  0.75       12/08/2022    4:16 PM 12/08/2022    4:15 PM 12/08/2022    3:32 PM 12/05/2022    9:18 AM 09/22/2022    1:38 PM 04/22/2022    9:00 AM 04/22/2022    8:12 AM  BP/Weight  Systolic BP 150 148 123  137 144 143  Diastolic BP 94 94 74  81 80 71  Wt. (Lbs)   177 176.5 176.5    BMI   29.45 kg/m2  29.37 kg/m2 29.37 kg/m2         No data to display            Hypothyroidism Controlled, no change in medication   Insomnia Controlled on OTC unisom Sleep hygiene reviewed and is good

## 2022-12-11 NOTE — Assessment & Plan Note (Signed)
Uncontrolled , add amlodipine 2.5 mg with 2 monthf/u DASH diet and commitment to daily physical activity for a minimum of 30 minutes discussed and encouraged, as a part of hypertension management. The importance of attaining a healthy weight is also discussed.     12/08/2022    4:16 PM 12/08/2022    4:15 PM 12/08/2022    3:32 PM 12/05/2022    9:18 AM 09/22/2022    1:38 PM 04/22/2022    9:00 AM 04/22/2022    8:12 AM  BP/Weight  Systolic BP 150 148 123  137 144 143  Diastolic BP 94 94 74  81 80 71  Wt. (Lbs)   177 176.5 176.5    BMI   29.45 kg/m2 29.37 kg/m2 29.37 kg/m2

## 2022-12-11 NOTE — Assessment & Plan Note (Signed)
Patient educated about the importance of limiting  Carbohydrate intake , the need to commit to daily physical activity for a minimum of 30 minutes , and to commit weight loss. The fact that changes in all these areas will reduce or eliminate all together the development of diabetes is stressed.  Deteriorated, will work on food choice      Latest Ref Rng & Units 11/28/2022    8:08 AM 02/01/2022    9:33 AM 05/24/2021    8:13 AM 09/21/2020   10:07 AM 09/17/2019    8:47 AM  Diabetic Labs  HbA1c 4.8 - 5.6 % 5.9  5.8  5.8     Chol 100 - 199 mg/dL 161  096  045  409  811   HDL >39 mg/dL 73  68  74  68  75   Calc LDL 0 - 99 mg/dL 914  782  956  213  086   Triglycerides 0 - 149 mg/dL 57  72  67  75  71   Creatinine 0.57 - 1.00 mg/dL 5.78  4.69  6.29  5.28  0.75       12/08/2022    4:16 PM 12/08/2022    4:15 PM 12/08/2022    3:32 PM 12/05/2022    9:18 AM 09/22/2022    1:38 PM 04/22/2022    9:00 AM 04/22/2022    8:12 AM  BP/Weight  Systolic BP 150 148 123  137 144 143  Diastolic BP 94 94 74  81 80 71  Wt. (Lbs)   177 176.5 176.5    BMI   29.45 kg/m2 29.37 kg/m2 29.37 kg/m2         No data to display

## 2022-12-11 NOTE — Assessment & Plan Note (Signed)
Controlled on OTC unisom Sleep hygiene reviewed and is good

## 2022-12-11 NOTE — Assessment & Plan Note (Signed)
Controlled, no change in medication  

## 2022-12-14 ENCOUNTER — Telehealth: Payer: Self-pay | Admitting: Podiatry

## 2022-12-14 NOTE — Telephone Encounter (Signed)
Called patient @ [phone] -- advised paperwork from [facility] was received for disability.  Completed documents and faxed to [facility] @ [phone] -- Rogene [place] advised same ....    J. Abbott -- 12/14/2022

## 2022-12-15 ENCOUNTER — Encounter: Payer: 59 | Admitting: Podiatry

## 2022-12-20 ENCOUNTER — Ambulatory Visit (INDEPENDENT_AMBULATORY_CARE_PROVIDER_SITE_OTHER): Payer: 59 | Admitting: Podiatry

## 2022-12-20 ENCOUNTER — Encounter: Payer: Self-pay | Admitting: Podiatry

## 2022-12-20 DIAGNOSIS — Z9889 Other specified postprocedural states: Secondary | ICD-10-CM

## 2022-12-20 DIAGNOSIS — M21619 Bunion of unspecified foot: Secondary | ICD-10-CM

## 2022-12-21 ENCOUNTER — Other Ambulatory Visit: Payer: Self-pay | Admitting: Family Medicine

## 2022-12-21 NOTE — Progress Notes (Signed)
Subjective: Chief Complaint  Patient presents with   Routine Post Op    POV right foot patient states having no pain has had wearing boot as directed.   57 year old female presents the office today with above concerns.  She states that she has been doing well her pain is controlled she is not taking any pain medication at this time.  She does not report any fevers or chills.  She has no other concerns.  Objective: AAO x3, NAD DP/PT pulses palpable bilaterally, CRT less than 3 seconds Status post bunionectomy, hammertoe repair of the right foot.  Incisions well coapted with sutures intact.  There is no surrounding erythema, ascending cellulitis.  There is no drainage or pus.  No signs of infection.  No significant pain on exam.  Toes are rectus.  K wire intact the second toe that any drainage or pus. No pain with calf compression, swelling, warmth, erythema  Assessment: Status post right foot surgery, healing well  Plan: -All treatment options discussed with the patient including all alternatives, risks, complications.  -The removal sutures were removed today.  Incisions are well coapted.  Small amount of antibiotic ointment is applied followed by dressing.  Discussed that she can change the dressing with a similar bandage or but she does not feel comfortable with this intact until follow-up.  Remain in cam boot.  Ice, elevation. -Monitor for any clinical signs or symptoms of infection and directed to call the office immediately should any occur or go to the ER. -Patient encouraged to call the office with any questions, concerns, change in symptoms.   X-ray next appointment   Vivi Barrack DPM

## 2022-12-27 ENCOUNTER — Ambulatory Visit (INDEPENDENT_AMBULATORY_CARE_PROVIDER_SITE_OTHER): Payer: 59

## 2022-12-27 ENCOUNTER — Encounter: Payer: Self-pay | Admitting: Podiatry

## 2022-12-27 ENCOUNTER — Ambulatory Visit (INDEPENDENT_AMBULATORY_CARE_PROVIDER_SITE_OTHER): Payer: 59 | Admitting: Podiatry

## 2022-12-27 DIAGNOSIS — M21619 Bunion of unspecified foot: Secondary | ICD-10-CM

## 2022-12-27 DIAGNOSIS — M778 Other enthesopathies, not elsewhere classified: Secondary | ICD-10-CM | POA: Diagnosis not present

## 2022-12-27 NOTE — Progress Notes (Signed)
Subjective: Chief Complaint  Patient presents with   Post-op Problem    RM#12 POV #3 no pain a bit of swelling around big toe area.    57 year old female presents the office today with above concerns.  States that she is doing well.  Pain is controlled.  Occasionally she will get your symptom but not a consistent basis.  She does not report any recent injuries.  She is walking the cam boot.  No fevers or chills.  No other concerns.     Objective: AAO x3, NAD DP/PT pulses palpable bilaterally, CRT less than 3 seconds Status post bunionectomy, hammertoe repair of the right foot.  Incisions well coapted without any evidence of dehiscence.  Minimal scabbing present on the incision.  There is no drainage or pus.  Minimal edema.  K wire intact but any drainage or pus.   No pain with calf compression, swelling, warmth, erythema  Assessment: Status post right foot surgery, healing well  Plan: -All treatment options discussed with the patient including all alternatives, risks, complications.  -X-rays obtained reviewed.  3 views of the foot were obtained.  Hardware intact with any complicating factors. -Healthy healing Can Remove This in 2 Weeks.  Small Amount of Antibiotic Ointment Is Applied along the K Wire, Incisions Followed by Dressing.  Discussed That She Can Do Similar Dressings at Home and Wash with Soap and Water.  Remain in Cam Boot or Surgical Shoe.  Ice, Elevation As Well As Compression to Help with Residual Edema. -Monitor for any clinical signs or symptoms of infection and directed to call the office immediately should any occur or go to the ER. -Patient encouraged to call the office with any questions, concerns, change in symptoms.   X-ray next appointment, k-wire removal   Vivi Barrack DPM

## 2023-01-10 ENCOUNTER — Ambulatory Visit (INDEPENDENT_AMBULATORY_CARE_PROVIDER_SITE_OTHER): Payer: 59

## 2023-01-10 ENCOUNTER — Encounter: Payer: Self-pay | Admitting: Podiatry

## 2023-01-10 ENCOUNTER — Ambulatory Visit (INDEPENDENT_AMBULATORY_CARE_PROVIDER_SITE_OTHER): Payer: 59 | Admitting: Podiatry

## 2023-01-10 DIAGNOSIS — Z9889 Other specified postprocedural states: Secondary | ICD-10-CM

## 2023-01-10 DIAGNOSIS — M778 Other enthesopathies, not elsewhere classified: Secondary | ICD-10-CM

## 2023-01-10 DIAGNOSIS — M21619 Bunion of unspecified foot: Secondary | ICD-10-CM

## 2023-01-10 NOTE — Progress Notes (Signed)
Subjective: Chief Complaint  Patient presents with   Routine Post Op    RM# 13 pin removal; x-ray POST OP 12     57 year old female presents the office today with above concerns.  She has been doing well.  She does not report any recent injuries or changes.  No fevers or chills.  No other concerns  Objective: AAO x3, NAD DP/PT pulses palpable bilaterally, CRT less than 3 seconds Status post bunionectomy, hammertoe repair of the right foot.  Incisions well coapted without any evidence of dehiscence.  Minimal scabbing present on the incision.  There is no drainage or pus.  Minimal edema.  Toe rectus. No pain with calf compression, swelling, warmth, erythema  Assessment: Status post right foot surgery, healing well  Plan: -All treatment options discussed with the patient including all alternatives, risks, complications.  -X-rays obtained reviewed.  3 views of the foot were obtained.  Hardware intact with any complicating factors.  Increased consolidation noted. -K wire removed without complications. -This plan will start physical therapy.  [facility] was sent upstairs in our office for this.  Discussed that she can start a gradual transition to regular shoe as tolerated as she starts to therapy.  Continue ice, elevate as well as compression Any residual edema. -Monitor for any clinical signs or symptoms of infection and directed to call the office immediately should any occur or go to the ER. -Patient encouraged to call the office with any questions, concerns, change in symptoms.   Return in about 4 weeks (around 02/07/2023) for post-op visit, x-ray.  Vivi Barrack DPM

## 2023-01-12 ENCOUNTER — Telehealth: Payer: Self-pay | Admitting: Podiatry

## 2023-01-12 NOTE — Telephone Encounter (Signed)
Patient called stating she has not heard anything from the physical therapy center yet and was advised to give our office a call if she hasn't. Please advise.  Thanks

## 2023-01-17 ENCOUNTER — Telehealth: Payer: Self-pay

## 2023-01-17 ENCOUNTER — Other Ambulatory Visit: Payer: Self-pay | Admitting: Family Medicine

## 2023-01-17 NOTE — Telephone Encounter (Signed)
Received  a call from [facility] - they received the order for physical therapy - they need the workers' compensation info, like the adjuster's name and phone, so that they can get approval for his PT. Please call to advise -thanks

## 2023-02-07 ENCOUNTER — Ambulatory Visit (INDEPENDENT_AMBULATORY_CARE_PROVIDER_SITE_OTHER): Payer: 59 | Admitting: Podiatry

## 2023-02-07 ENCOUNTER — Ambulatory Visit (INDEPENDENT_AMBULATORY_CARE_PROVIDER_SITE_OTHER): Payer: 59

## 2023-02-07 ENCOUNTER — Encounter: Payer: Self-pay | Admitting: Podiatry

## 2023-02-07 DIAGNOSIS — Z9889 Other specified postprocedural states: Secondary | ICD-10-CM

## 2023-02-07 DIAGNOSIS — M21619 Bunion of unspecified foot: Secondary | ICD-10-CM

## 2023-02-09 ENCOUNTER — Ambulatory Visit: Payer: 59 | Admitting: Family Medicine

## 2023-02-12 NOTE — Progress Notes (Signed)
 Subjective: Chief Complaint  Patient presents with   Routine Post Op    RM#11 POV  patient concerned still having swelling and is unable to bend second toe.    58 year old female presents the office today with above concerns.  She still continue with physical therapy.  She did try for the second toe and that did cause discomfort.  She was putting the actual toe itself.  Does not report any recent injury or change otherwise.  No fevers or chills.  No other concerns.  Objective: AAO x3, NAD DP/PT pulses palpable bilaterally, CRT less than 3 seconds Status post bunionectomy, hammertoe repair of the right foot.  Toes are rectus.  There is also mild edema present but there is no erythema or warmth.  No signs of infection.  Arthrodesis site appears to be stable right second PIPJ. No pain with calf compression, swelling, warmth, erythema  Assessment: Status post right foot surgery, healing well  Plan: -All treatment options discussed with the patient including all alternatives, risks, complications.  -X-rays obtained reviewed.  3 views of the foot were obtained.  Hardware intact with any complicating factors.  Increased consolidation noted. -Recommend continue physical therapy.  Discussed that the second toe itself is nontender.  How to get the MPJ we will plan to release to work on that reduction.  Regular shoe as tolerated.  Continue ice, elevate as well as compression to help with the residual edema.  Return in about 4 weeks (around 03/07/2023).  X-rays appointment  Donnice JONELLE Fees DPM

## 2023-02-14 ENCOUNTER — Other Ambulatory Visit: Payer: Self-pay | Admitting: Family Medicine

## 2023-02-16 NOTE — Progress Notes (Signed)
   Established Patient Office Visit   Subjective  Patient ID: Gail Cox, female    DOB: [birth year]  Age: 58 y.o. MRN: [id number]  Chief Complaint  Patient presents with   Medical Management of Chronic Issues    2 mo med follow up.     She  has a past medical history of Allergic rhinitis, seasonal, Asthma, History of kidney stones, Hypothyroidism, and Migraine headache.  HPI Patient presents to the clinic for hypertension follow up. For the details of today's visit, please refer to assessment and plan.  Review of Systems  Constitutional:  Negative for fever.  Eyes:  Negative for blurred vision.  Respiratory:  Negative for shortness of breath.   Cardiovascular:  Negative for chest pain.  Neurological:  Negative for dizziness and headaches.      Objective:     BP 134/82   Pulse 75   Ht 5\' 5"  (1.651 m)   Wt 181 lb 1.3 oz (82.1 kg)   SpO2 96%   BMI 30.13 kg/m  BP Readings from Last 3 Encounters:  02/17/23 134/82  12/08/22 (!) 150/94  09/22/22 137/81      Physical Exam Vitals reviewed.  Constitutional:      General: She is not in acute distress.    Appearance: Normal appearance. She is not ill-appearing, toxic-appearing or diaphoretic.  HENT:     Head: Normocephalic.  Eyes:     General:        Right eye: No discharge.        Left eye: No discharge.     Conjunctiva/sclera: Conjunctivae normal.  Cardiovascular:     Rate and Rhythm: Normal rate.     Pulses: Normal pulses.     Heart sounds: Normal heart sounds.  Pulmonary:     Effort: Pulmonary effort is normal. No respiratory distress.     Breath sounds: Normal breath sounds.  Skin:    General: Skin is warm and dry.     Capillary Refill: Capillary refill takes less than 2 seconds.  Neurological:     Mental Status: She is alert.  Psychiatric:        Mood and Affect: Mood normal.        Behavior: Behavior normal.      No results found for any visits on 02/17/23.  The 10-year ASCVD risk score  (Arnett DK, et al., 2019) is: 6%    Assessment & Plan:  Essential hypertension Assessment & Plan: Vitals:   02/17/23 1540 02/17/23 1555  BP: 137/80 134/82  Controlled in today's visit Continue Amlodipine 7.5 mg once daily- 5 mg and 2.5 mg dose together  Discussed with  patient to monitor their blood pressure regularly and maintain a heart-healthy diet rich in fruits, vegetables, whole grains, and low-fat dairy, while reducing sodium intake to less than 2,300 mg per day. Regular physical activity, such as 30 minutes of moderate exercise most days of the week, will help lower blood pressure and improve overall cardiovascular health. Avoiding smoking, limiting alcohol consumption, and managing stress. Take  prescribed medication, & take it as directed and avoid skipping doses. Seek emergency care if your blood pressure is (over 180/100) or you experience chest pain, shortness of breath, or sudden vision changes.Patient verbalizes understanding regarding plan of care and all questions answered.      Return in 4 months (on 06/17/2023), or if symptoms worsen or fail to improve, for chronic follow-up with Dr. Syliva Overman .   Cruzita Lederer Newman Nip, FNP

## 2023-02-16 NOTE — Patient Instructions (Signed)

## 2023-02-17 ENCOUNTER — Ambulatory Visit (INDEPENDENT_AMBULATORY_CARE_PROVIDER_SITE_OTHER): Payer: 59 | Admitting: Family Medicine

## 2023-02-17 ENCOUNTER — Encounter: Payer: Self-pay | Admitting: Family Medicine

## 2023-02-17 VITALS — BP 134/82 | HR 75 | Ht 65.0 in | Wt 181.1 lb

## 2023-02-17 DIAGNOSIS — I10 Essential (primary) hypertension: Secondary | ICD-10-CM

## 2023-02-17 NOTE — Assessment & Plan Note (Signed)
Vitals:   02/17/23 1540 02/17/23 1555  BP: 137/80 134/82  Controlled in today's visit Continue Amlodipine 7.5 mg once daily- 5 mg and 2.5 mg dose together  Discussed with  patient to monitor their blood pressure regularly and maintain a heart-healthy diet rich in fruits, vegetables, whole grains, and low-fat dairy, while reducing sodium intake to less than 2,300 mg per day. Regular physical activity, such as 30 minutes of moderate exercise most days of the week, will help lower blood pressure and improve overall cardiovascular health. Avoiding smoking, limiting alcohol consumption, and managing stress. Take  prescribed medication, & take it as directed and avoid skipping doses. Seek emergency care if your blood pressure is (over 180/100) or you experience chest pain, shortness of breath, or sudden vision changes.Patient verbalizes understanding regarding plan of care and all questions answered.

## 2023-03-04 ENCOUNTER — Other Ambulatory Visit: Payer: Self-pay | Admitting: Family Medicine

## 2023-03-18 ENCOUNTER — Other Ambulatory Visit: Payer: Self-pay | Admitting: Family Medicine

## 2023-03-20 ENCOUNTER — Other Ambulatory Visit: Payer: Self-pay | Admitting: Family Medicine

## 2023-04-20 ENCOUNTER — Other Ambulatory Visit: Payer: Self-pay | Admitting: Family Medicine

## 2023-05-06 ENCOUNTER — Other Ambulatory Visit: Payer: Self-pay | Admitting: Family Medicine

## 2023-05-21 ENCOUNTER — Other Ambulatory Visit: Payer: Self-pay | Admitting: Family Medicine

## 2023-05-22 ENCOUNTER — Other Ambulatory Visit: Payer: Self-pay

## 2023-05-22 MED ORDER — AMLODIPINE BESYLATE 2.5 MG PO TABS
2.5000 mg | ORAL_TABLET | Freq: Every day | ORAL | 4 refills | Status: DC
  Filled 2023-06-20: qty 90, 90d supply, fill #0
  Filled 2023-09-18: qty 30, 30d supply, fill #1
  Filled 2023-10-22: qty 30, 30d supply, fill #2

## 2023-05-22 MED ORDER — AMLODIPINE BESYLATE 5 MG PO TABS
5.0000 mg | ORAL_TABLET | Freq: Every day | ORAL | 0 refills | Status: AC
  Filled 2023-06-20: qty 90, 90d supply, fill #0

## 2023-05-31 ENCOUNTER — Other Ambulatory Visit: Payer: Self-pay

## 2023-05-31 MED ORDER — SYNTHROID 100 MCG PO TABS
100.0000 ug | ORAL_TABLET | Freq: Every day | ORAL | 5 refills | Status: DC
  Filled 2023-07-24: qty 30, 30d supply, fill #0
  Filled 2023-09-06 – 2023-09-07 (×2): qty 30, 30d supply, fill #1
  Filled 2023-10-12: qty 30, 30d supply, fill #2
  Filled 2023-11-28: qty 30, 30d supply, fill #3
  Filled 2024-01-07: qty 30, 30d supply, fill #4

## 2023-06-19 ENCOUNTER — Other Ambulatory Visit (HOSPITAL_COMMUNITY): Payer: Self-pay

## 2023-06-20 ENCOUNTER — Other Ambulatory Visit (HOSPITAL_COMMUNITY): Payer: Self-pay

## 2023-06-20 MED ORDER — AMLODIPINE BESYLATE 5 MG PO TABS
5.0000 mg | ORAL_TABLET | Freq: Every day | ORAL | 4 refills | Status: DC
  Filled 2023-09-18: qty 90, 90d supply, fill #0
  Filled 2023-12-24: qty 30, 30d supply, fill #1

## 2023-06-20 MED ORDER — AMOXICILLIN 500 MG PO CAPS: ORAL_CAPSULE | ORAL | 0 refills | Status: AC

## 2023-06-22 ENCOUNTER — Other Ambulatory Visit (HOSPITAL_COMMUNITY): Payer: Self-pay

## 2023-06-30 ENCOUNTER — Telehealth: Payer: Self-pay

## 2023-06-30 NOTE — Telephone Encounter (Signed)
Pt informed

## 2023-06-30 NOTE — Telephone Encounter (Signed)
 Copied from CRM [id number]. Topic: Clinical - Medication Question >> Jun 30, 2023  8:15 AM Georgeann Kindred wrote: Reason for CRM: Patient requesting a medication for a boil on her between her breast. She would like a cream or an antibiotic. Please contact patient at [phone].

## 2023-07-02 DIAGNOSIS — E663 Overweight: Secondary | ICD-10-CM | POA: Diagnosis not present

## 2023-07-02 DIAGNOSIS — Z6829 Body mass index (BMI) 29.0-29.9, adult: Secondary | ICD-10-CM | POA: Diagnosis not present

## 2023-07-02 DIAGNOSIS — R030 Elevated blood-pressure reading, without diagnosis of hypertension: Secondary | ICD-10-CM | POA: Diagnosis not present

## 2023-07-02 DIAGNOSIS — L0291 Cutaneous abscess, unspecified: Secondary | ICD-10-CM | POA: Diagnosis not present

## 2023-07-03 ENCOUNTER — Other Ambulatory Visit (HOSPITAL_COMMUNITY): Payer: Self-pay

## 2023-07-03 MED ORDER — DOXYCYCLINE HYCLATE 100 MG PO CAPS
100.0000 mg | ORAL_CAPSULE | Freq: Two times a day (BID) | ORAL | 0 refills | Status: AC
  Filled 2023-07-03: qty 20, 10d supply, fill #0

## 2023-07-24 ENCOUNTER — Other Ambulatory Visit (HOSPITAL_COMMUNITY): Payer: Self-pay

## 2023-07-25 ENCOUNTER — Other Ambulatory Visit (HOSPITAL_COMMUNITY): Payer: Self-pay

## 2023-09-07 ENCOUNTER — Other Ambulatory Visit (HOSPITAL_COMMUNITY): Payer: Self-pay

## 2023-09-07 ENCOUNTER — Other Ambulatory Visit: Payer: Self-pay

## 2023-09-18 ENCOUNTER — Other Ambulatory Visit (HOSPITAL_COMMUNITY): Payer: Self-pay

## 2023-10-23 ENCOUNTER — Other Ambulatory Visit (HOSPITAL_COMMUNITY): Payer: Self-pay

## 2023-11-08 ENCOUNTER — Other Ambulatory Visit: Payer: Self-pay | Admitting: Family Medicine

## 2023-11-08 DIAGNOSIS — N644 Mastodynia: Secondary | ICD-10-CM

## 2023-11-20 ENCOUNTER — Other Ambulatory Visit: Payer: Self-pay | Admitting: Family Medicine

## 2023-11-21 ENCOUNTER — Other Ambulatory Visit (HOSPITAL_COMMUNITY): Payer: Self-pay

## 2023-11-21 MED ORDER — AMLODIPINE BESYLATE 2.5 MG PO TABS
2.5000 mg | ORAL_TABLET | Freq: Every day | ORAL | 4 refills | Status: AC
  Filled 2023-11-21: qty 30, 30d supply, fill #0
  Filled 2023-12-24: qty 30, 30d supply, fill #1
  Filled 2024-01-27: qty 30, 30d supply, fill #2
  Filled 2024-02-26: qty 30, 30d supply, fill #3

## 2023-11-23 ENCOUNTER — Other Ambulatory Visit (HOSPITAL_COMMUNITY): Payer: Self-pay

## 2023-11-23 ENCOUNTER — Ambulatory Visit
Admission: RE | Admit: 2023-11-23 | Discharge: 2023-11-23 | Disposition: A | Source: Ambulatory Visit | Attending: Family Medicine | Admitting: Family Medicine

## 2023-11-23 DIAGNOSIS — N644 Mastodynia: Secondary | ICD-10-CM | POA: Diagnosis not present

## 2023-11-23 DIAGNOSIS — R928 Other abnormal and inconclusive findings on diagnostic imaging of breast: Secondary | ICD-10-CM | POA: Diagnosis not present

## 2023-11-23 MED ORDER — FLUZONE 0.5 ML IM SUSY
0.5000 mL | PREFILLED_SYRINGE | Freq: Once | INTRAMUSCULAR | 0 refills | Status: AC
  Filled 2023-11-23: qty 0.5, 1d supply, fill #0

## 2023-11-29 ENCOUNTER — Other Ambulatory Visit (HOSPITAL_COMMUNITY): Payer: Self-pay

## 2023-12-07 ENCOUNTER — Encounter (INDEPENDENT_AMBULATORY_CARE_PROVIDER_SITE_OTHER): Payer: Self-pay | Admitting: Gastroenterology

## 2023-12-12 ENCOUNTER — Telehealth: Payer: Self-pay | Admitting: Family Medicine

## 2023-12-12 NOTE — Telephone Encounter (Signed)
 Called patient left voicemail to call office to schedule in office visit with Dr Antonetta before end of the year.

## 2023-12-15 ENCOUNTER — Telehealth: Payer: Self-pay

## 2023-12-15 NOTE — Telephone Encounter (Signed)
 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  No appointment was scheduled.

## 2023-12-15 NOTE — Telephone Encounter (Signed)
 Copied from CRM #[id number]. Topic: Appointments - Scheduling Inquiry for Clinic >> Dec 15, 2023 10:43 AM Marylynn H wrote: Reason for CRM: Patient states she is needing lab work done but needs to know where she can go on a Saturday. States she works M-F starting at [employer]. Also wanted to schedule follow up with PCP but nothing is available on my end till February, note in chart states it needs to be before end of year.   Please advise [phone]

## 2023-12-18 NOTE — Telephone Encounter (Signed)
 Patient called back to schedule a office visit, only available appointment prior to the end of the year is with Dr. Tobie for 12/24. Patient stated she will not be able to get off. Patient scheduled office visit with PCP for 03/16/2023 at 4:20 pm because she gets off at 4:00 pm. Patient stated she will call her insurance to confirm if they cover [facility] and will call back to request PCP send in lab orders to [facility] for her to have labs done on a Saturday possible.

## 2023-12-18 NOTE — Telephone Encounter (Signed)
 Noted

## 2023-12-20 ENCOUNTER — Telehealth: Payer: Self-pay

## 2023-12-20 ENCOUNTER — Other Ambulatory Visit: Payer: Self-pay

## 2023-12-20 DIAGNOSIS — E038 Other specified hypothyroidism: Secondary | ICD-10-CM

## 2023-12-20 DIAGNOSIS — I10 Essential (primary) hypertension: Secondary | ICD-10-CM

## 2023-12-20 DIAGNOSIS — R7302 Impaired glucose tolerance (oral): Secondary | ICD-10-CM

## 2023-12-20 DIAGNOSIS — E559 Vitamin D deficiency, unspecified: Secondary | ICD-10-CM

## 2023-12-20 DIAGNOSIS — E785 Hyperlipidemia, unspecified: Secondary | ICD-10-CM

## 2023-12-20 NOTE — Telephone Encounter (Signed)
 Copied from CRM [id number]. Topic: Clinical - Request for Lab/Test Order >> Dec 20, 2023  9:49 AM Gail Cox wrote: Reason for CRM: Patient is calling in to see if lab work is needed before her appt scheduled for 03/15/2024, patient is req a call back in regards to this, patient stated VM can be left for her.

## 2023-12-20 NOTE — Telephone Encounter (Signed)
 Done

## 2023-12-25 ENCOUNTER — Other Ambulatory Visit: Payer: Self-pay

## 2023-12-25 ENCOUNTER — Other Ambulatory Visit (HOSPITAL_COMMUNITY): Payer: Self-pay

## 2024-01-26 DIAGNOSIS — E559 Vitamin D deficiency, unspecified: Secondary | ICD-10-CM | POA: Diagnosis not present

## 2024-01-26 DIAGNOSIS — I10 Essential (primary) hypertension: Secondary | ICD-10-CM | POA: Diagnosis not present

## 2024-01-26 DIAGNOSIS — E038 Other specified hypothyroidism: Secondary | ICD-10-CM | POA: Diagnosis not present

## 2024-01-26 DIAGNOSIS — E785 Hyperlipidemia, unspecified: Secondary | ICD-10-CM | POA: Diagnosis not present

## 2024-01-26 DIAGNOSIS — R7302 Impaired glucose tolerance (oral): Secondary | ICD-10-CM | POA: Diagnosis not present

## 2024-01-27 ENCOUNTER — Other Ambulatory Visit: Payer: Self-pay | Admitting: Family Medicine

## 2024-01-27 LAB — CMP14+EGFR
ALT: 20 IU/L (ref 0–32)
AST: 18 IU/L (ref 0–40)
Albumin: 4.3 g/dL (ref 3.8–4.9)
Alkaline Phosphatase: 88 IU/L (ref 49–135)
BUN/Creatinine Ratio: 16 (ref 12–28)
BUN: 13 mg/dL (ref 8–27)
Bilirubin Total: 0.8 mg/dL (ref 0.0–1.2)
CO2: 24 mmol/L (ref 20–29)
Calcium: 9.5 mg/dL (ref 8.7–10.3)
Chloride: 106 mmol/L (ref 96–106)
Creatinine, Ser: 0.8 mg/dL (ref 0.57–1.00)
Globulin, Total: 2.1 g/dL (ref 1.5–4.5)
Glucose: 93 mg/dL (ref 70–99)
Potassium: 4.4 mmol/L (ref 3.5–5.2)
Sodium: 144 mmol/L (ref 134–144)
Total Protein: 6.4 g/dL (ref 6.0–8.5)
eGFR: 84 mL/min/1.73

## 2024-01-27 LAB — CBC WITH DIFFERENTIAL/PLATELET
Basophils Absolute: 0.1 x10E3/uL (ref 0.0–0.2)
Basos: 3 %
EOS (ABSOLUTE): 0.2 x10E3/uL (ref 0.0–0.4)
Eos: 4 %
Hematocrit: 41.4 % (ref 34.0–46.6)
Hemoglobin: 13.7 g/dL (ref 11.1–15.9)
Immature Grans (Abs): 0 x10E3/uL (ref 0.0–0.1)
Immature Granulocytes: 0 %
Lymphocytes Absolute: 2 x10E3/uL (ref 0.7–3.1)
Lymphs: 41 %
MCH: 28.4 pg (ref 26.6–33.0)
MCHC: 33.1 g/dL (ref 31.5–35.7)
MCV: 86 fL (ref 79–97)
Monocytes Absolute: 0.4 x10E3/uL (ref 0.1–0.9)
Monocytes: 8 %
Neutrophils Absolute: 2.1 x10E3/uL (ref 1.4–7.0)
Neutrophils: 44 %
Platelets: 323 x10E3/uL (ref 150–450)
RBC: 4.83 x10E6/uL (ref 3.77–5.28)
RDW: 13.1 % (ref 11.7–15.4)
WBC: 4.8 x10E3/uL (ref 3.4–10.8)

## 2024-01-27 LAB — VITAMIN D 25 HYDROXY (VIT D DEFICIENCY, FRACTURES): Vit D, 25-Hydroxy: 29 ng/mL — ABNORMAL LOW (ref 30.0–100.0)

## 2024-01-27 LAB — LIPID PANEL
Chol/HDL Ratio: 3.1 ratio (ref 0.0–4.4)
Cholesterol, Total: 205 mg/dL — ABNORMAL HIGH (ref 100–199)
HDL: 66 mg/dL
LDL Chol Calc (NIH): 127 mg/dL — ABNORMAL HIGH (ref 0–99)
Triglycerides: 68 mg/dL (ref 0–149)
VLDL Cholesterol Cal: 12 mg/dL (ref 5–40)

## 2024-01-27 LAB — HEMOGLOBIN A1C
Est. average glucose Bld gHb Est-mCnc: 123 mg/dL
Hgb A1c MFr Bld: 5.9 % — ABNORMAL HIGH (ref 4.8–5.6)

## 2024-01-27 LAB — TSH: TSH: 2.47 u[IU]/mL (ref 0.450–4.500)

## 2024-01-29 ENCOUNTER — Other Ambulatory Visit (HOSPITAL_COMMUNITY): Payer: Self-pay

## 2024-01-29 ENCOUNTER — Other Ambulatory Visit: Payer: Self-pay

## 2024-01-29 ENCOUNTER — Ambulatory Visit: Payer: Self-pay | Admitting: Family Medicine

## 2024-01-29 MED ORDER — AMLODIPINE BESYLATE 5 MG PO TABS
5.0000 mg | ORAL_TABLET | Freq: Every day | ORAL | 4 refills | Status: AC
  Filled 2024-01-29: qty 30, 30d supply, fill #0
  Filled 2024-02-26: qty 30, 30d supply, fill #1

## 2024-02-19 ENCOUNTER — Other Ambulatory Visit: Payer: Self-pay | Admitting: Family Medicine

## 2024-02-20 ENCOUNTER — Other Ambulatory Visit (HOSPITAL_COMMUNITY): Payer: Self-pay

## 2024-02-20 MED ORDER — SYNTHROID 100 MCG PO TABS
100.0000 ug | ORAL_TABLET | Freq: Every day | ORAL | 5 refills | Status: AC
  Filled 2024-02-20: qty 30, 30d supply, fill #0

## 2024-02-28 ENCOUNTER — Other Ambulatory Visit (HOSPITAL_COMMUNITY): Payer: Self-pay

## 2024-03-15 ENCOUNTER — Ambulatory Visit: Payer: Self-pay | Admitting: Family Medicine

## 2024-04-19 ENCOUNTER — Ambulatory Visit: Payer: Self-pay | Admitting: Family Medicine
